# Patient Record
Sex: Male | Born: 1955 | Race: White | Hispanic: No | Marital: Married | Smoking: Current every day smoker
Health system: Southern US, Community
[De-identification: ages and names within clinical notes are randomized; demographics above are authoritative.]

## PROBLEM LIST (undated history)

## (undated) DIAGNOSIS — I2510 Atherosclerotic heart disease of native coronary artery without angina pectoris: Secondary | ICD-10-CM

## (undated) DIAGNOSIS — I219 Acute myocardial infarction, unspecified: Secondary | ICD-10-CM

## (undated) DIAGNOSIS — K219 Gastro-esophageal reflux disease without esophagitis: Secondary | ICD-10-CM

## (undated) DIAGNOSIS — I10 Essential (primary) hypertension: Secondary | ICD-10-CM

## (undated) DIAGNOSIS — N200 Calculus of kidney: Secondary | ICD-10-CM

## (undated) DIAGNOSIS — Z87442 Personal history of urinary calculi: Secondary | ICD-10-CM

## (undated) DIAGNOSIS — G839 Paralytic syndrome, unspecified: Secondary | ICD-10-CM

## (undated) DIAGNOSIS — J449 Chronic obstructive pulmonary disease, unspecified: Secondary | ICD-10-CM

## (undated) DIAGNOSIS — M1990 Unspecified osteoarthritis, unspecified site: Secondary | ICD-10-CM

## (undated) DIAGNOSIS — M51369 Other intervertebral disc degeneration, lumbar region without mention of lumbar back pain or lower extremity pain: Secondary | ICD-10-CM

## (undated) DIAGNOSIS — M5136 Other intervertebral disc degeneration, lumbar region: Secondary | ICD-10-CM

## (undated) DIAGNOSIS — M5126 Other intervertebral disc displacement, lumbar region: Secondary | ICD-10-CM

## (undated) DIAGNOSIS — J189 Pneumonia, unspecified organism: Secondary | ICD-10-CM

## (undated) HISTORY — PX: APPENDECTOMY: SHX54

## (undated) HISTORY — PX: HAND SURGERY: SHX662

## (undated) HISTORY — DX: Gastro-esophageal reflux disease without esophagitis: K21.9

## (undated) HISTORY — PX: BACK SURGERY: SHX140

## (undated) HISTORY — DX: Calculus of kidney: N20.0

## (undated) HISTORY — PX: CATARACT EXTRACTION: SUR2

## (undated) HISTORY — DX: Essential (primary) hypertension: I10

---

## 2004-10-10 HISTORY — PX: KNEE ARTHROSCOPY: SUR90

## 2005-01-25 ENCOUNTER — Ambulatory Visit (HOSPITAL_COMMUNITY): Admission: RE | Admit: 2005-01-25 | Discharge: 2005-01-25 | Payer: Self-pay | Admitting: Gastroenterology

## 2005-06-02 ENCOUNTER — Ambulatory Visit: Payer: Self-pay | Admitting: Orthopedic Surgery

## 2005-06-07 ENCOUNTER — Ambulatory Visit (HOSPITAL_COMMUNITY): Admission: RE | Admit: 2005-06-07 | Discharge: 2005-06-07 | Payer: Self-pay | Admitting: Orthopedic Surgery

## 2005-06-16 ENCOUNTER — Ambulatory Visit: Payer: Self-pay | Admitting: Orthopedic Surgery

## 2005-07-01 ENCOUNTER — Ambulatory Visit: Payer: Self-pay | Admitting: Orthopedic Surgery

## 2005-07-01 ENCOUNTER — Ambulatory Visit (HOSPITAL_COMMUNITY): Admission: RE | Admit: 2005-07-01 | Discharge: 2005-07-01 | Payer: Self-pay | Admitting: Orthopedic Surgery

## 2005-07-04 ENCOUNTER — Ambulatory Visit: Payer: Self-pay | Admitting: Orthopedic Surgery

## 2005-07-25 ENCOUNTER — Ambulatory Visit: Payer: Self-pay | Admitting: Orthopedic Surgery

## 2005-08-17 ENCOUNTER — Ambulatory Visit: Payer: Self-pay | Admitting: Orthopedic Surgery

## 2006-05-15 ENCOUNTER — Ambulatory Visit: Payer: Self-pay | Admitting: Orthopedic Surgery

## 2013-06-05 ENCOUNTER — Ambulatory Visit (INDEPENDENT_AMBULATORY_CARE_PROVIDER_SITE_OTHER): Payer: BC Managed Care – PPO

## 2013-06-05 ENCOUNTER — Encounter: Payer: Self-pay | Admitting: Orthopedic Surgery

## 2013-06-05 ENCOUNTER — Ambulatory Visit (INDEPENDENT_AMBULATORY_CARE_PROVIDER_SITE_OTHER): Payer: BC Managed Care – PPO | Admitting: Orthopedic Surgery

## 2013-06-05 VITALS — BP 129/69 | Ht 73.0 in | Wt 260.0 lb

## 2013-06-05 DIAGNOSIS — M25561 Pain in right knee: Secondary | ICD-10-CM | POA: Insufficient documentation

## 2013-06-05 DIAGNOSIS — M19079 Primary osteoarthritis, unspecified ankle and foot: Secondary | ICD-10-CM

## 2013-06-05 DIAGNOSIS — M25569 Pain in unspecified knee: Secondary | ICD-10-CM

## 2013-06-05 DIAGNOSIS — M179 Osteoarthritis of knee, unspecified: Secondary | ICD-10-CM | POA: Insufficient documentation

## 2013-06-05 DIAGNOSIS — M1710 Unilateral primary osteoarthritis, unspecified knee: Secondary | ICD-10-CM

## 2013-06-05 NOTE — Patient Instructions (Addendum)
You have received a steroid shot. 15% of patients experience increased pain at the injection site with in the next 24 hours. This is best treated with ice and tylenol extra strength 2 tabs every 8 hours. If you are still having pain please call the office.   Joint Injection Care After Refer to this sheet in the next few days. These instructions provide you with information on caring for yourself after you have had a joint injection. Your caregiver also may give you more specific instructions. Your treatment has been planned according to current medical practices, but problems sometimes occur. Call your caregiver if you have any problems or questions after your procedure. After any type of joint injection, it is not uncommon to experience:  Soreness, swelling, or bruising around the injection site.  Mild numbness, tingling, or weakness around the injection site caused by the numbing medicine used before or with the injection. It also is possible to experience the following effects associated with the specific agent after injection:  Iodine-based contrast agents:  Allergic reaction (itching, hives, widespread redness, and swelling beyond the injection site).  Corticosteroids (These effects are rare.):  Allergic reaction.  Increased blood sugar levels (If you have diabetes and you notice that your blood sugar levels have increased, notify your caregiver).  Increased blood pressure levels.  Mood swings.  Hyaluronic acid in the use of viscosupplementation.  Temporary heat or redness.  Temporary rash and itching.  Increased fluid accumulation in the injected joint. These effects all should resolve within a day after your procedure.  HOME CARE INSTRUCTIONS  Limit yourself to light activity the day of your procedure. Avoid lifting heavy objects, bending, stooping, or twisting.  Take prescription or over-the-counter pain medication as directed by your caregiver.  You may apply ice to  your injection site to reduce pain and swelling the day of your procedure. Ice may be applied 3-4 times:  Put ice in a plastic bag.  Place a towel between your skin and the bag.  Leave the ice on for no longer than 15-20 minutes each time. SEEK IMMEDIATE MEDICAL CARE IF:   Pain and swelling get worse rather than better or extend beyond the injection site.  Numbness does not go away.  Blood or fluid continues to leak from the injection site.  You have chest pain.  You have swelling of your face or tongue.  You have trouble breathing or you become dizzy.  You develop a fever, chills, or severe tenderness at the injection site that last longer than 1 day. MAKE SURE YOU:  Understand these instructions.  Watch your condition.  Get help right away if you are not doing well or if you get worse. Document Released: 06/09/2011 Document Revised: 12/19/2011 Document Reviewed: 06/09/2011 [facility] Patient Information 2014 [place], [place]. Wear and Tear Disorders of the Knee (Arthritis, Osteoarthritis) Everyone will experience wear and tear injuries (arthritis, osteoarthritis) of the knee. These are the changes we all get as we age. They come from the joint stress of daily living. The amount of cartilage damage in your knee and your symptoms determine if you need surgery. Mild problems require approximately two months recovery time. More severe problems take several months to recover. With mild problems, your surgeon may find worn and rough cartilage surfaces. With severe changes, your surgeon may find cartilage that has completely worn away and exposed the bone. Loose bodies of bone and cartilage, bone spurs (excess bone growth), and injuries to the menisci (cushions between the large  bones of your leg) are also common. All of these problems can cause pain. For a mild wear and tear problem, rough cartilage may simply need to be shaved and smoothed. For more severe problems with areas of exposed  bone, your surgeon may use an instrument for roughing up the bone surfaces to stimulate new cartilage growth. Loose bodies are usually removed. Torn menisci may be trimmed or repaired. ABOUT THE ARTHROSCOPIC PROCEDURE Arthroscopy is a surgical technique. It allows your orthopedic surgeon to diagnose and treat your knee injury with accuracy. The surgeon looks into your knee through a small scope. The scope is like a small (pencil-sized) telescope. Arthroscopy is less invasive than open knee surgery. You can expect a more rapid recovery. After the procedure, you will be moved to a recovery area until most of the effects of the medication have worn off. Your caregiver will discuss the test results with you. RECOVERY The severity of the arthritis and the type of procedure performed will determine recovery time. Other important factors include age, physical condition, medical conditions, and the type of rehabilitation program. Strengthening your muscles after arthroscopy helps guarantee a better recovery. Follow your caregiver's instructions. Use crutches, rest, elevate, ice, and do knee exercises as instructed. Your caregivers will help you and instruct you with exercises and other physical therapy required to regain your mobility, muscle strength, and functioning following surgery. Only take over-the-counter or prescription medicines for pain, discomfort, or fever as directed by your caregiver.  SEEK MEDICAL CARE IF:   There is increased bleeding (more than a small spot) from the wound.  You notice redness, swelling, or increasing pain in the wound.  Pus is coming from wound.  You develop an unexplained oral temperature above 102 F (38.9 C) , or as your caregiver suggests.  You notice a foul smell coming from the wound or dressing.  You have severe pain with motion of the knee. SEEK IMMEDIATE MEDICAL CARE IF:   You develop a rash.  You have difficulty breathing.  You have any allergic  problems. MAKE SURE YOU:   Understand these instructions.  Will watch your condition.  Will get help right away if you are not doing well or get worse. Document Released: 09/23/2000 Document Revised: 12/19/2011 Document Reviewed: 02/20/2008 [facility] Patient Information 2014 [place], [place].

## 2013-06-05 NOTE — Progress Notes (Signed)
Patient ID: Leon Mitchell, male   DOB: [birth year], 57 y.o.   MRN: [id number]  Chief Complaint  Patient presents with  . Knee Pain    Right knee pain, hurts when sitting and giving occasional giving away.    The patient comes in with a history of a left knee arthroscopy which ended several years ago he did well presents now with right knee pain  Pain came on gradually without trauma. Complains of sharp 7/10 intermittent symptoms which are worse when he sitting and appear to be over the front of the knee. Lung bases driving has to straighten his knee out. He reports catching swelling and tingling but no radiation of pain  Review of systems positive for flank pain when he had his kidney stone and joint pain his other systems were reviewed and are normal  The past, family history and social history have been reviewed and are recorded in the corresponding sections of epic   BP 129/69  Ht 6\' 1"  (1.854 m)  Wt 260 lb (117.935 kg)  BMI 34.31 kg/m2 General appearance is normal he is oriented x3 his mood and affect is normal he has normal gait and station. 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  He did also mention that he has bilateral foot pain and I recommended some orthotics most likely has some degenerative arthrosis there as well  If the injection we given today does not work then we will consider arthroscopic surgery  Knee  Injection Procedure Note  Pre-operative Diagnosis: right knee oa  Post-operative Diagnosis:  same  Indications: pain  Anesthesia: ethyl chloride   Procedure Details   Verbal consent was obtained for the procedure. Time out was completed.The joint was prepped with alcohol, followed by  Ethyl chloride spray and A 20 gauge needle was inserted into the knee via lateral approach; 4ml 1% lidocaine and 1 ml of depomedrol  was then injected into the joint . The needle was removed and the area cleansed and dressed.  Complications:  None; patient tolerated the procedure well.

## 2013-07-22 ENCOUNTER — Encounter: Payer: Self-pay | Admitting: Orthopedic Surgery

## 2013-07-25 ENCOUNTER — Telehealth: Payer: Self-pay | Admitting: Orthopedic Surgery

## 2013-07-25 NOTE — Telephone Encounter (Signed)
Patient had been called with voice message left on answer machine, and a follow up letter had been sent, regarding appointment for 08/06/13, which needs to be re-schedule due to change in provider schedule.  Patient returned call; elects to hold on appointment; states feeling better.

## 2013-08-06 ENCOUNTER — Ambulatory Visit: Payer: BC Managed Care – PPO | Admitting: Orthopedic Surgery

## 2013-09-17 ENCOUNTER — Encounter (INDEPENDENT_AMBULATORY_CARE_PROVIDER_SITE_OTHER): Payer: Self-pay

## 2013-09-17 ENCOUNTER — Encounter (INDEPENDENT_AMBULATORY_CARE_PROVIDER_SITE_OTHER): Payer: Self-pay | Admitting: *Deleted

## 2013-09-19 ENCOUNTER — Ambulatory Visit (INDEPENDENT_AMBULATORY_CARE_PROVIDER_SITE_OTHER): Payer: BC Managed Care – PPO | Admitting: Orthopedic Surgery

## 2013-09-19 ENCOUNTER — Encounter: Payer: Self-pay | Admitting: Orthopedic Surgery

## 2013-09-19 VITALS — BP 144/82 | Ht 73.0 in | Wt 260.0 lb

## 2013-09-19 DIAGNOSIS — M1710 Unilateral primary osteoarthritis, unspecified knee: Secondary | ICD-10-CM

## 2013-09-19 NOTE — Patient Instructions (Signed)
GLUCOSAMINE/CHONDROITIN CARTILAGE [OSTEOBIFLEX]

## 2013-09-19 NOTE — Progress Notes (Signed)
Patient ID: Leon Mitchell, male   DOB: [birth year], 57 y.o.   MRN: [id number]  Chief Complaint  Patient presents with  . Follow-up    2 month recheck on right knee after injection.    [name] in for evaluation of his right knee for continued anterior right knee pain. Status post injection right knee. He says he primarily has anterior knee pain mainly controlled with over-the-counter medication  System review.denies mechanical symptoms  Examination BP 144/82  Ht 6\' 1"  (1.854 m)  Wt 260 lb (117.935 kg)  BMI 34.31 kg/m2 General appearance is normal, the patient is alert and oriented x3 with normal mood and affect. He still maintained his range of motion he has medial joint line tenderness and some crepitance in the patellofemoral joint his knee is stable there is no effusion Neurovascular exam is intact  Encounter Diagnosis  Name Primary?  . OA (osteoarthritis) of knee Yes    Like a cortisone injection in his right knee  Knee  Injection Procedure Note  Pre-operative Diagnosis: right knee oa  Post-operative Diagnosis: same  Indications: pain  Anesthesia: ethyl chloride   Procedure Details   Verbal consent was obtained for the procedure. Time out was completed.The joint was prepped with alcohol, followed by  Ethyl chloride spray and A 20 gauge needle was inserted into the knee via lateral approach; 4ml 1% lidocaine and 1 ml of depomedrol  was then injected into the joint . The needle was removed and the area cleansed and dressed.  Complications:  None; patient tolerated the procedure well.

## 2014-07-09 ENCOUNTER — Encounter: Payer: Self-pay | Admitting: Orthopedic Surgery

## 2015-11-05 ENCOUNTER — Other Ambulatory Visit (INDEPENDENT_AMBULATORY_CARE_PROVIDER_SITE_OTHER): Payer: Self-pay | Admitting: *Deleted

## 2015-11-05 DIAGNOSIS — Z86010 Personal history of colonic polyps: Secondary | ICD-10-CM

## 2015-11-20 ENCOUNTER — Other Ambulatory Visit (INDEPENDENT_AMBULATORY_CARE_PROVIDER_SITE_OTHER): Payer: Self-pay | Admitting: *Deleted

## 2015-11-20 ENCOUNTER — Encounter (INDEPENDENT_AMBULATORY_CARE_PROVIDER_SITE_OTHER): Payer: Self-pay | Admitting: *Deleted

## 2015-11-20 MED ORDER — PEG 3350-KCL-NA BICARB-NACL 420 G PO SOLR: 4000.0000 mL | Freq: Once | ORAL | Status: DC

## 2015-11-20 NOTE — Telephone Encounter (Signed)
Patient needs trilyte 

## 2015-11-25 ENCOUNTER — Ambulatory Visit (INDEPENDENT_AMBULATORY_CARE_PROVIDER_SITE_OTHER): Payer: BLUE CROSS/BLUE SHIELD | Admitting: Orthopedic Surgery

## 2015-11-25 ENCOUNTER — Ambulatory Visit (INDEPENDENT_AMBULATORY_CARE_PROVIDER_SITE_OTHER): Payer: BLUE CROSS/BLUE SHIELD

## 2015-11-25 ENCOUNTER — Encounter: Payer: Self-pay | Admitting: Orthopedic Surgery

## 2015-11-25 VITALS — BP 169/105 | Ht 73.0 in | Wt 251.0 lb

## 2015-11-25 DIAGNOSIS — M79671 Pain in right foot: Secondary | ICD-10-CM

## 2015-11-25 DIAGNOSIS — M19071 Primary osteoarthritis, right ankle and foot: Secondary | ICD-10-CM

## 2015-11-25 DIAGNOSIS — M129 Arthropathy, unspecified: Secondary | ICD-10-CM

## 2015-11-25 NOTE — Progress Notes (Signed)
Patient ID: Leon Mitchell, male   DOB: 04-14-1956, 60 y.o.   MRN: [id number]  Chief Complaint  Patient presents with  . Foot Pain    Rt foot pain    HPI Leon Mitchell is a 60 y.o. male.  Presents for evaluation of right foot pain. Basically he's had several year history of ongoing pain in his right foot quality dull throbbing and sharp, severity increasing and 7+ at 10 duration as stated several years timing constant worse with exercise not improved with 70 Dosepak  Review of Systems Review of Systems  Past Medical History  Diagnosis Date  . Hypertension     Past Surgical History  Procedure Laterality Date  . Knee arthroscopy Left   . Hand surgery    . Appendectomy      No family history on file.  Social History Social History  Substance Use Topics  . Smoking status: Current Every Day Smoker  . Smokeless tobacco: None  . Alcohol Use: Yes    No Known Allergies  Current Outpatient Prescriptions  Medication Sig Dispense Refill  . naproxen sodium (ANAPROX) 220 MG tablet Take 220 mg by mouth 2 (two) times daily with a meal.    . polyethylene glycol-electrolytes (NULYTELY/GOLYTELY) 420 g solution Take 4,000 mLs by mouth once. 4000 mL 0   No current facility-administered medications for this visit.       Physical Exam Physical Exam Blood pressure 169/105, height 6\' 1"  (1.854 m), weight 251 lb (113.853 kg). Appearance, there are no abnormalities in terms of appearance the patient was well-developed and well-nourished. The grooming and hygiene were normal.  Mental status orientation, there was normal alertness and orientation Mood pleasant Ambulatory status normal with no assistive devices  Examination of the right foot tenderness over the dorsal lateral foot near the cuboid and midtarsal joints with normal range of motion at the ankle normal stability tests for the ankle no atrophy in the foot Skin warm dry and intact without laceration or ulceration or  erythema Neurologic examination normal sensation Vascular examination normal pulses with warm extremity and normal capillary refill  The opposite extremity normal alignment motion no atrophy    Data Reviewed X-ray shows dorsal spurs midfoot plantar spurs mild Achilles spur  Assessment  Dorsal lateral arthritis of the foot   Plan  Failed oral prednisone also failed Aleve recommend injection  Follow-up as needed  Inject dorsal lateral right foot Verbal consent obtained Timeout to confirm site  Alcohol and ethyl chloride prep  Injection performed appropriate precautions given

## 2015-12-09 ENCOUNTER — Telehealth (INDEPENDENT_AMBULATORY_CARE_PROVIDER_SITE_OTHER): Payer: Self-pay | Admitting: *Deleted

## 2015-12-09 NOTE — Telephone Encounter (Signed)
agree

## 2015-12-09 NOTE — Telephone Encounter (Signed)
Referring MD/PCP: vyas   Procedure: tcs  Reason/Indication:  Hx polyps  Has patient had this procedure before?  Yes, 2009 -- sacnned  If so, when, by whom and where?    Is there a family history of colon cancer?  no  Who?  What age when diagnosed?    Is patient diabetic?   no      Does patient have prosthetic heart valve or mechanical valve?  o  Do you have a pacemaker?  no  Has patient ever had endocarditis? no  Has patient had joint replacement within last 12 months?  no  Does patient tend to be constipated or take laxatives? no  Does patient have a history of alcohol/drug use?  no  Is patient on Coumadin, Plavix and/or Aspirin? no  Medications: none  Allergies: nkda  Medication Adjustment:   Procedure date & time: 01/06/16 at 730

## 2015-12-18 ENCOUNTER — Encounter: Payer: Self-pay | Admitting: *Deleted

## 2015-12-18 ENCOUNTER — Ambulatory Visit (INDEPENDENT_AMBULATORY_CARE_PROVIDER_SITE_OTHER): Payer: BLUE CROSS/BLUE SHIELD | Admitting: Diagnostic Neuroimaging

## 2015-12-18 VITALS — BP 159/95 | HR 69 | Ht 72.0 in | Wt 255.4 lb

## 2015-12-18 DIAGNOSIS — M4714 Other spondylosis with myelopathy, thoracic region: Secondary | ICD-10-CM

## 2015-12-18 NOTE — Progress Notes (Signed)
[facility]  PATIENT: Leon Mitchell DOB: [birth year]  REFERRING CLINICIAN: D Vyas HISTORY FROM: patient (review of outside notes) REASON FOR VISIT: new consult    HISTORICAL  CHIEF COMPLAINT:  Chief Complaint  Patient presents with  . Ataxia, ? transverse myelitis    rm 6, New pt," 4-5 wks ago I felt sore all over, began stumbling, feel jolts from my back down, numb from chest down, feet ice cold"    HISTORY OF PRESENT ILLNESS:   60 year old right-handed male here for evaluation of numbness and weakness from chest down to feet.  3 months ago patient noticed some pain in his legs. He was having some intermittent muscle Cramping. Around 6 weeks ago, beginning A. Fib rate 2017, patient had significant and sudden onset of weakness and heaviness in his hips, legs, with numbness from his middle chest all the way down to his toes. Patient having more problems with balance and walking. Patient was evaluated by PCP who ordered MRI of the lumbar spine which showed multilevel degenerative changes. Patient was also suspected to have thoracic myelopathy and referred to me for further evaluation.  Patient has had some gradual onset of frequent urination and mild impotence over past several years, but not significant worsened since past 6 weeks. He has no significant weakness in his hands or arms. No significant neck pain. No significant change in vision, speech or headaches. Patient having some intermittent electrical jolts and spasms when he moves his head or spine.    REVIEW OF SYSTEMS: Full 14 system review of systems performed and negative with exception of: Fatigue joint pain numbness weakness decreased energy joint pain cramps aching muscles urination problems impotence.   ALLERGIES: No Known Allergies  HOME MEDICATIONS: Outpatient Prescriptions Prior to Visit  Medication Sig Dispense Refill  . naproxen sodium (ANAPROX) 220 MG tablet Take 220 mg by mouth 2 (two)  times daily with a meal.    . polyethylene glycol-electrolytes (NULYTELY/GOLYTELY) 420 g solution Take 4,000 mLs by mouth once. (Patient not taking: Reported on 12/18/2015) 4000 mL 0   No facility-administered medications prior to visit.    PAST MEDICAL HISTORY: Past Medical History  Diagnosis Date  . Hypertension   . Kidney stones   . GERD (gastroesophageal reflux disease)     PAST SURGICAL HISTORY: Past Surgical History  Procedure Laterality Date  . Knee arthroscopy Left 2006  . Hand surgery    . Appendectomy      FAMILY HISTORY: Family History  Problem Relation Age of Onset  . Hypertension Mother   . Hypertension Father   . Hypertension Sister   . Hypertension Brother     SOCIAL HISTORY:  Social History   Social History  . Marital Status: Widowed    Spouse Name: N/A  . Number of Children: 2  . Years of Education: 13   Occupational History  .      Crop Production   Social History Main Topics  . Smoking status: Current Every Day Smoker -- 1.00 packs/day  . Smokeless tobacco: Not on file  . Alcohol Use: Yes     Comment: very little  . Drug Use: No  . Sexual Activity: Not on file   Other Topics Concern  . Not on file   Social History Narrative   Mother lives with patient   Caffeine use- 3-4 glasses tea     PHYSICAL EXAM  GENERAL EXAM/CONSTITUTIONAL: Vitals:  Filed Vitals:   12/18/15 0950  BP: 159/95  Pulse: 69  Height: 6' (1.829 m)  Weight: 255 lb 6.4 oz (115.849 kg)     Body mass index is 34.63 kg/(m^2).  Visual Acuity Screening   Right eye Left eye Both eyes  Without correction: 20/20 20/30   With correction:        Patient is in no distress; well developed, nourished and groomed; neck is supple  CARDIOVASCULAR:  Examination of carotid arteries is normal; no carotid bruits  Regular rate and rhythm, no murmurs  Examination of peripheral vascular system by observation and palpation is normal  EYES:  Ophthalmoscopic exam of  optic discs and posterior segments is normal; no papilledema or hemorrhages  MUSCULOSKELETAL:  Gait, strength, tone, movements noted in Neurologic exam below  NEUROLOGIC: MENTAL STATUS:  No flowsheet data found.  awake, alert, oriented to person, place and time  recent and remote memory intact  normal attention and concentration  language fluent, comprehension intact, naming intact  fund of knowledge appropriate  CRANIAL NERVE:   2nd - no papilledema on fundoscopic exam  2nd, 3rd, 4th, 6th - pupils equal and reactive to light, visual fields full to confrontation, extraocular muscles intact, no nystagmus  5th - facial sensation symmetric  7th - facial strength symmetric  8th - hearing intact  9th - palate elevates symmetrically, uvula midline  11th - shoulder shrug symmetric  12th - tongue protrusion midline  MOTOR:   normal bulk and tone, full strength in the BUE, BLE; EXCEPT BLE 4+; SLIGHT INCR TONE IN BLE  SENSORY:   normal and symmetric to light touch; DECR PP FROM T5 DOWN TO FEET; VIB < 5 SEC AT TOES  COORDINATION:   finger-nose-finger, fine finger movements normal  REFLEXES:   deep tendon reflexes BRISK (3+) and symmetric; MUTE TOES  GAIT/STATION:   SLOW CAUTIOUS GAIT; SLIGHTLY UNSTEADY    DIAGNOSTIC DATA (LABS, IMAGING, TESTING) - I reviewed patient records, labs, notes, testing and imaging myself where available.  No results found for: WBC, HGB, HCT, MCV, PLT No results found for: NA, K, CL, CO2, GLUCOSE, BUN, CREATININE, CALCIUM, PROT, ALBUMIN, AST, ALT, ALKPHOS, BILITOT, GFRNONAA, GFRAA No results found for: CHOL, HDL, LDLCALC, LDLDIRECT, TRIG, CHOLHDL No results found for: HGBA1C No results found for: VITAMINB12 No results found for: TSH   12/08/15 MRI lumbar spine [I reviewed images myself and agree with interpretation. 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 Summary of pertinent findings includes: - T10-11 mild to moderate bilateral foraminal narrowing. - T11-12 bulge with ventral thecal sac narrowing and mild posterior cord displacement. Kyphosis centered at this level. Axial images not obtained. - L1-2 multifactorial moderate spinal stenosis and minimal bilateral foraminal narrowing. - L2-3 multifactorial moderate spinal stenosis and minimal bilateral foraminal narrowing. - L3-4 multifactorial moderate to marked spinal stenosis and mild to moderate bilateral foraminal narrowing. - L4-5 multifactorial moderate spinal stenosis and mild to moderate bilateral foraminal narrowing. - L5-S1 facet degenerative changes. - Atherosclerotic changes of the abdominal aorta with dilation suspected but incompletely assessed on present exam. Ectatic common iliac arteries measuring up to 1.8 cm on the left. CT angiogram or ultrasound can be obtained for further delineation. - Right renal cysts incompletely assessed on present exam.    ASSESSMENT AND PLAN  60 y.o. year old male here with progressive lower extremity pain and cramps, with more severe worsening of weakness and numbness from chest down to feet 6 weeks ago. Exam notable for mild  weakness of lower extremities, brisk reflexes throughout, slightly increased tone in lower extremities, decreased sensation from T5 down to the toes. 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 Etiologies could include extrinsic compression versus intrinsic autoimmune or inflammatory etiologies. We'll check MRI of the thoracic spine for further evaluation.   Dx:  1. Thoracic myelopathy      PLAN: - check MRI thoracic spine - check labs  Orders Placed This Encounter  Procedures  . MR Thoracic Spine W Wo Contrast  . Vitamin B12  . VITAMIN D 25 Hydroxy (Vit-D Deficiency, Fractures)  . ANA w/Reflex if Positive   Return in about 1 month (around 01/18/2016).  I reviewed images, labs,  notes, records myself. I summarized findings and reviewed with patient, for this high risk condition (possible thoracic myelopathy) requiring high complexity decision making.     Penni Bombard, MD [id number], [id number] AM Certified in Neurology, Neurophysiology and Neuroimaging  [facility] [address] [place], [place] [address] [phone]

## 2015-12-18 NOTE — Patient Instructions (Addendum)
Thank you for coming to see [place] at [facility]. I hope we have been able to provide you high quality care today.  You may receive a patient satisfaction survey over the next few weeks. We would appreciate your feedback and comments so that we may continue to improve ourselves and the health of our patients.  - I will check MRI thoracic spine and labs - be cautious with balance and walking and driving   ~~~~~~~~~~~~~~~~~~~~~~~~~~~~~~~~~~~~~~~~~~~~~~~~~~~~~~~~~~~~~~~~~  DR. Zaion Hreha'S GUIDE TO HAPPY AND HEALTHY LIVING These are some of my general health and wellness recommendations. Some of them may apply to you better than others. Please use common sense as you try these suggestions and feel free to ask me any questions.   ACTIVITY/FITNESS Mental, social, emotional and physical stimulation are very important for brain and body health. Try learning a new activity (arts, music, language, sports, games).  Keep moving your body to the best of your abilities. You can do this at home, inside or outside, the park, community center, gym or anywhere you like. Consider a physical therapist or personal trainer to get started. Consider the app Sworkit. Fitness trackers such as smart-watches, smart-phones or Fitbits can help as well.   NUTRITION Eat more plants: colorful vegetables, nuts, seeds and berries.  Eat less sugar, salt, preservatives and processed foods.  Avoid toxins such as cigarettes and alcohol.  Drink water when you are thirsty. Warm water with a slice of lemon is an excellent morning drink to start the day.  Consider these websites for more information The Nutrition Source (https://www.henry-hernandez.biz/) Precision Nutrition (WindowBlog.ch)   RELAXATION Consider practicing mindfulness meditation or other relaxation techniques such as deep breathing, prayer, yoga, tai chi, massage. See website mindful.org or the apps  Headspace or Calm to help get started.   SLEEP Try to get at least 7-8+ hours sleep per day. Regular exercise and reduced caffeine will help you sleep better. Practice good sleep hygeine techniques. See website sleep.org for more information.   PLANNING Prepare estate planning, living will, healthcare POA documents. Sometimes this is best planned with the help of an attorney. Theconversationproject.org and agingwithdignity.org are excellent resources.

## 2015-12-19 LAB — ANA W/REFLEX IF POSITIVE
Anti JO-1: <0.2 AI (ref 0.0–0.9)
Anti Nuclear Antibody(ANA): POSITIVE — AB
Centromere Ab Screen: <0.2 AI (ref 0.0–0.9)
Chromatin Ab SerPl-aCnc: <0.2 AI (ref 0.0–0.9)
DSDNA AB: 2 [IU]/mL (ref 0–9)
ENA RNP Ab: 1 AI — ABNORMAL HIGH (ref 0.0–0.9)
ENA SM AB SER-ACNC: 0.2 AI (ref 0.0–0.9)
ENA SSA (RO) Ab: <0.2 AI (ref 0.0–0.9)
ENA SSB (LA) Ab: <0.2 AI (ref 0.0–0.9)

## 2015-12-19 LAB — VITAMIN D 25 HYDROXY (VIT D DEFICIENCY, FRACTURES): Vit D, 25-Hydroxy: 19.9 ng/mL — ABNORMAL LOW (ref 30.0–100.0)

## 2015-12-19 LAB — VITAMIN B12: Vitamin B-12: 199 pg/mL — ABNORMAL LOW (ref 211–946)

## 2015-12-21 ENCOUNTER — Telehealth: Payer: Self-pay | Admitting: *Deleted

## 2015-12-21 NOTE — Telephone Encounter (Signed)
Spoke with patient and informed him that his Vit B12 and Vit D are low. Dr Leta Baptist advises he begin a multivitamin daily. Informed him the ANA test is elevated, but Dr Leta Baptist will wait for MRI results to correlate. He verbalized understanding, agreement.

## 2015-12-30 ENCOUNTER — Ambulatory Visit (INDEPENDENT_AMBULATORY_CARE_PROVIDER_SITE_OTHER): Payer: BLUE CROSS/BLUE SHIELD

## 2015-12-30 DIAGNOSIS — M4714 Other spondylosis with myelopathy, thoracic region: Secondary | ICD-10-CM | POA: Diagnosis not present

## 2015-12-30 MED ORDER — GADOPENTETATE DIMEGLUMINE 469.01 MG/ML IV SOLN: 20.0000 mL | Freq: Once | INTRAVENOUS | Status: DC | PRN

## 2016-01-01 ENCOUNTER — Telehealth: Payer: Self-pay | Admitting: Diagnostic Neuroimaging

## 2016-01-01 DIAGNOSIS — M4714 Other spondylosis with myelopathy, thoracic region: Secondary | ICD-10-CM

## 2016-01-01 MED ORDER — PREDNISONE 10 MG PO TABS: ORAL_TABLET | ORAL | Status: DC

## 2016-01-01 NOTE — Telephone Encounter (Signed)
I called patient and gave MRI results. T5 myelopathy confirmed. Will refer urgently to Dr. Maia Plan Neurosurgery for evaluation. Will send in prednisone pack to start now. Advised patient to avoid any heavy lifting. Patient to go to ER if any sudden worsening over the weekend or before NSGY eval. Patient agrees with plan.  Regarding lab testing, he should start multi-vitamin. Re: ANA, likely an incidental finding given MRI results, but may need rheumatology follow up after NSGY eval.     Penni Bombard, MD [id number], [id number] PM Certified in Neurology, Neurophysiology and Neuroimaging  [facility] [address] [place], [place] [address] [phone]

## 2016-01-04 NOTE — Telephone Encounter (Signed)
Spoke with Louretta Shorten at Dr Elta Guadeloupe Roy's office and advised her of urgent referral. faxed all required documents. She stated she would make Dr Carloyn Manner aware.

## 2016-01-05 ENCOUNTER — Telehealth: Payer: Self-pay | Admitting: Diagnostic Neuroimaging

## 2016-01-05 NOTE — Telephone Encounter (Signed)
Waiting on Neuro Surgery to call him for Dr. Carloyn Manner office. Patient would  Like a call back explaining  MRI results again he is not in full understanding. [phone].

## 2016-01-05 NOTE — Telephone Encounter (Signed)
i called patient and explained mri results again. Pt understands plan. -VRP

## 2016-01-06 ENCOUNTER — Encounter (HOSPITAL_COMMUNITY): Payer: Self-pay | Admitting: *Deleted

## 2016-01-06 ENCOUNTER — Ambulatory Visit (HOSPITAL_COMMUNITY)
Admission: RE | Admit: 2016-01-06 | Discharge: 2016-01-06 | Disposition: A | Discharge status: Home / Self Care | Payer: BLUE CROSS/BLUE SHIELD | Source: Ambulatory Visit | Attending: Internal Medicine | Admitting: Internal Medicine

## 2016-01-06 ENCOUNTER — Encounter (HOSPITAL_COMMUNITY)
Admission: RE | Disposition: A | Discharge status: Home / Self Care | Payer: Self-pay | Source: Ambulatory Visit | Attending: Internal Medicine

## 2016-01-06 DIAGNOSIS — K5730 Diverticulosis of large intestine without perforation or abscess without bleeding: Secondary | ICD-10-CM

## 2016-01-06 DIAGNOSIS — Z86010 Personal history of colonic polyps: Secondary | ICD-10-CM

## 2016-01-06 DIAGNOSIS — K219 Gastro-esophageal reflux disease without esophagitis: Secondary | ICD-10-CM | POA: Insufficient documentation

## 2016-01-06 DIAGNOSIS — I10 Essential (primary) hypertension: Secondary | ICD-10-CM | POA: Diagnosis not present

## 2016-01-06 DIAGNOSIS — Z09 Encounter for follow-up examination after completed treatment for conditions other than malignant neoplasm: Secondary | ICD-10-CM

## 2016-01-06 DIAGNOSIS — Z79899 Other long term (current) drug therapy: Secondary | ICD-10-CM | POA: Insufficient documentation

## 2016-01-06 DIAGNOSIS — Z1211 Encounter for screening for malignant neoplasm of colon: Secondary | ICD-10-CM | POA: Diagnosis not present

## 2016-01-06 DIAGNOSIS — D125 Benign neoplasm of sigmoid colon: Secondary | ICD-10-CM | POA: Diagnosis not present

## 2016-01-06 DIAGNOSIS — K644 Residual hemorrhoidal skin tags: Secondary | ICD-10-CM | POA: Diagnosis not present

## 2016-01-06 DIAGNOSIS — D120 Benign neoplasm of cecum: Secondary | ICD-10-CM

## 2016-01-06 DIAGNOSIS — D123 Benign neoplasm of transverse colon: Secondary | ICD-10-CM

## 2016-01-06 DIAGNOSIS — Z791 Long term (current) use of non-steroidal anti-inflammatories (NSAID): Secondary | ICD-10-CM | POA: Diagnosis not present

## 2016-01-06 HISTORY — DX: Other intervertebral disc displacement, lumbar region: M51.26

## 2016-01-06 HISTORY — DX: Other intervertebral disc degeneration, lumbar region without mention of lumbar back pain or lower extremity pain: M51.369

## 2016-01-06 HISTORY — PX: COLONOSCOPY: SHX5424

## 2016-01-06 HISTORY — DX: Other intervertebral disc degeneration, lumbar region: M51.36

## 2016-01-06 SURGERY — COLONOSCOPY
Anesthesia: Moderate Sedation

## 2016-01-06 MED ORDER — STERILE WATER FOR IRRIGATION IR SOLN
Status: DC | PRN
  Administered 2016-01-06: 08:00:00

## 2016-01-06 MED ORDER — MIDAZOLAM HCL 5 MG/5ML IJ SOLN
INTRAMUSCULAR | Status: DC | PRN
  Administered 2016-01-06 (×3): 2 mg via INTRAVENOUS
  Administered 2016-01-06: 1 mg via INTRAVENOUS

## 2016-01-06 MED ORDER — MIDAZOLAM HCL 5 MG/5ML IJ SOLN
INTRAMUSCULAR | Status: AC
  Filled 2016-01-06: qty 10

## 2016-01-06 MED ORDER — SODIUM CHLORIDE 0.9 % IV SOLN: INTRAVENOUS | Status: DC

## 2016-01-06 MED ORDER — MEPERIDINE HCL 50 MG/ML IJ SOLN
INTRAMUSCULAR | Status: AC
  Filled 2016-01-06: qty 1

## 2016-01-06 MED ORDER — MEPERIDINE HCL 50 MG/ML IJ SOLN
INTRAMUSCULAR | Status: DC | PRN
  Administered 2016-01-06 (×2): 25 mg via INTRAVENOUS

## 2016-01-06 NOTE — Discharge Instructions (Signed)
Resume usual medications and high fiber diet. Keep Naprosyn use to minimum. No driving for 24 hours. Physician will call with biopsy results.  Colonoscopy, Care After These instructions give you information on caring for yourself after your procedure. Your doctor may also give you more specific instructions. Call your doctor if you have any problems or questions after your procedure. HOME CARE  Do not drive for 24 hours.  Do not sign important papers or use machinery for 24 hours.  You may shower.  You may go back to your usual activities, but go slower for the first 24 hours.  Take rest breaks often during the first 24 hours.  Walk around or use warm packs on your belly (abdomen) if you have belly cramping or gas.  Drink enough fluids to keep your pee (urine) clear or pale yellow.  Resume your normal diet. Avoid heavy or fried foods.  Avoid drinking alcohol for 24 hours or as told by your doctor.  Only take medicines as told by your doctor. If a tissue sample (biopsy) was taken during the procedure:   Do not take aspirin or blood thinners for 7 days, or as told by your doctor.  Do not drink alcohol for 7 days, or as told by your doctor.  Eat soft foods for the first 24 hours. GET HELP IF: You still have a small amount of blood in your poop (stool) 2-3 days after the procedure. GET HELP RIGHT AWAY IF:  You have more than a small amount of blood in your poop.  You see clumps of tissue (blood clots) in your poop.  Your belly is puffy (swollen).  You feel sick to your stomach (nauseous) or throw up (vomit).  You have a fever.  You have belly pain that gets worse and medicine does not help. MAKE SURE YOU:  Understand these instructions.  Will watch your condition.  Will get help right away if you are not doing well or get worse.   This information is not intended to replace advice given to you by your health care provider. Make sure you discuss any questions you  have with your health care provider.   Document Released: 10/29/2010 Document Revised: 10/01/2013 Document Reviewed: 06/03/2013 Elsevier Interactive Patient Education 2016 Elsevier Inc.  Moderate Conscious Sedation, Adult, Care After Refer to this sheet in the next few weeks. These instructions provide you with information on caring for yourself after your procedure. Your health care provider may also give you more specific instructions. Your treatment has been planned according to current medical practices, but problems sometimes occur. Call your health care provider if you have any problems or questions after your procedure. WHAT TO EXPECT AFTER THE PROCEDURE  After your procedure:  You may feel sleepy, clumsy, and have poor balance for several hours.  Vomiting may occur if you eat too soon after the procedure. HOME CARE INSTRUCTIONS  Do not participate in any activities where you could become injured for at least 24 hours. Do not:  Drive.  Swim.  Ride a bicycle.  Operate heavy machinery.  Cook.  Use power tools.  Climb ladders.  Work from a high place.  Do not make important decisions or sign legal documents until you are improved.  If you vomit, drink water, juice, or soup when you can drink without vomiting. Make sure you have little or no nausea before eating solid foods.  Only take over-the-counter or prescription medicines for pain, discomfort, or fever as directed by your health  care provider.  Make sure you and your family fully understand everything about the medicines given to you, including what side effects may occur.  You should not drink alcohol, take sleeping pills, or take medicines that cause drowsiness for at least 24 hours.  If you smoke, do not smoke without supervision.  If you are feeling better, you may resume normal activities 24 hours after you were sedated.  Keep all appointments with your health care provider. SEEK MEDICAL CARE IF:  Your  skin is pale or bluish in color.  You continue to feel nauseous or vomit.  Your pain is getting worse and is not helped by medicine.  You have bleeding or swelling.  You are still sleepy or feeling clumsy after 24 hours. SEEK IMMEDIATE MEDICAL CARE IF:  You develop a rash.  You have difficulty breathing.  You develop any type of allergic problem.  You have a fever. MAKE SURE YOU:  Understand these instructions.  Will watch your condition.  Will get help right away if you are not doing well or get worse.   This information is not intended to replace advice given to you by your health care provider. Make sure you discuss any questions you have with your health care provider.   Document Released: 07/17/2013 Document Revised: 10/17/2014 Document Reviewed: 07/17/2013 Elsevier Interactive Patient Education [employer].

## 2016-01-06 NOTE — Telephone Encounter (Signed)
Pt called said he has not heard from Dr Rex Kras office and Hinton Dyer C requested he let her know. Pt can be reached at [phone]

## 2016-01-06 NOTE — Op Note (Signed)
[facility] Patient Name: Leon Mitchell Procedure Date: 01/06/2016 7:30 AM MRN: [id number] Date of Birth: [birth year] Attending MD: Hildred Laser , MD CSN: [id number] Age: 60 Admit Type: Outpatient Procedure:                Colonoscopy Indications:              High risk colon cancer surveillance: Personal                            history of colonic polyps Providers:                Hildred Laser, MD, Gwenlyn Fudge, RN, Randa Spike, Technician Referring MD:             Glenda Chroman (Referring MD) Medicines:                Meperidine 50 mg IV, Midazolam 7 mg IV Complications:            No immediate complications. Estimated Blood Loss:     Estimated blood loss was minimal. Procedure:                Pre-Anesthesia Assessment:                           - Prior to the procedure, a History and Physical                            was performed, and patient medications and                            allergies were reviewed. The patient's tolerance of                            previous anesthesia was also reviewed. The risks                            and benefits of the procedure and the sedation                            options and risks were discussed with the patient.                            All questions were answered, and informed consent                            was obtained. Prior Anticoagulants: The patient                            last took naproxen 14 days prior to the procedure.                            ASA Grade Assessment: I - A normal, healthy  patient. After reviewing the risks and benefits,                            the patient was deemed in satisfactory condition to                            undergo the procedure.                           After obtaining informed consent, the colonoscope                            was passed under direct vision. Throughout the                            procedure, the  patient's blood pressure, pulse, and                            oxygen saturations were monitored continuously. The                            [id number]) scope was introduced through                            the anus and advanced to the the cecum, identified                            by appendiceal orifice and ileocecal valve. The                            colonoscopy was performed without difficulty. The                            patient tolerated the procedure well. The quality                            of the bowel preparation was adequate. The                            ileocecal valve, appendiceal orifice, and rectum                            were photographed. Scope In: 7:44:14 AM Scope Out: 8:08:19 AM Scope Withdrawal Time: 0 hours 16 minutes 40 seconds  Total Procedure Duration: 0 hours 24 minutes 5 seconds  Findings:      A single non-bleeding erosion was found in the cecum. No stigmata of       recent bleeding were seen.      Three sessile polyps were found in the sigmoid colon, splenic flexure       and cecum. The polyps were small in size. These were biopsied with a       cold forceps for histology. Estimated blood loss was minimal.      Multiple medium-mouthed diverticula were found in the sigmoid colon.      Non-bleeding external hemorrhoids  were found during retroflexion. The       hemorrhoids were small. Impression:               - A single erosion in the cecum(secondary to NSAID).                           - Three small polyps in the sigmoid colon, at the                            splenic flexure and in the cecum. Biopsied.                           - Diverticulosis in the sigmoid colon.                           - Non-bleeding external hemorrhoids. Moderate Sedation:      Moderate (conscious) sedation was administered by the endoscopy nurse       and supervised by the endoscopist. The following parameters were       monitored: oxygen saturation,  heart rate, blood pressure, CO2       capnography and response to care. Total physician intraservice time was       31 minutes. Recommendation:           - Patient has a contact number available for                            emergencies. The signs and symptoms of potential                            delayed complications were discussed with the                            patient. Return to normal activities tomorrow.                            Written discharge instructions were provided to the                            patient.                           - High fiber diet today.                           - Continue present medications.                           - Await pathology results.                           - Repeat colonoscopy date to be determined after                            pending pathology results are reviewed for  surveillance. Procedure Code(s):        --- Professional ---                           [phone], Colonoscopy, flexible; with biopsy, single                            or multiple                           99152, Moderate sedation services provided by the                            same physician or other qualified health care                            professional performing the diagnostic or                            therapeutic service that the sedation supports,                            requiring the presence of an independent trained                            observer to assist in the monitoring of the                            patient's level of consciousness and physiological                            status; initial 15 minutes of intraservice time,                            patient age 31 years or older                           [phone], Moderate sedation services; each additional                            15 minutes intraservice time Diagnosis Code(s):        --- Professional ---                           D12.5, Benign  neoplasm of sigmoid colon                           D12.3, Benign neoplasm of transverse colon (hepatic                            flexure or splenic flexure)                           D12.0, Benign neoplasm of cecum  K64.4, Residual hemorrhoidal skin tags                           K57.30, Diverticulosis of large intestine without                            perforation or abscess without bleeding CPT copyright 2016 American Medical Association. All rights reserved. The codes documented in this report are preliminary and upon coder review may  be revised to meet current compliance requirements. Hildred Laser, MD Hildred Laser, MD 01/06/2016 8:22:10 AM This report has been signed electronically. Number of Addenda: 0

## 2016-01-06 NOTE — H&P (Signed)
Leon Mitchell is an 60 y.o. male.   Chief Complaint: Patient is here for colonoscopy. HPI: Patient is 60 year old Caucasian male who has history of colonic adenomas and is here for surveillance colonoscopy. He denies abdominal pain change in bowel habits or rectal bleeding. Patient had adenomas on his first colonoscopy in [place] back in 2006. Last exam in 2009 revealed 10 mm hyperplastic polyp. Family history is negative for CRC.  Past Medical History  Diagnosis Date  . Hypertension   . Kidney stones   . GERD (gastroesophageal reflux disease)   . Bulging lumbar disc     Past Surgical History  Procedure Laterality Date  . Knee arthroscopy Left 2006  . Hand surgery    . Appendectomy      Family History  Problem Relation Age of Onset  . Hypertension Mother   . Hypertension Father   . Hypertension Sister   . Hypertension Brother    Social History:  reports that he has been smoking.  He does not have any smokeless tobacco history on file. He reports that he drinks alcohol. He reports that he does not use illicit drugs.  Allergies: No Known Allergies  Medications Prior to Admission  Medication Sig Dispense Refill  . lisinopril (PRINIVIL,ZESTRIL) 10 MG tablet Take 10 mg by mouth daily.  3  . naproxen sodium (ANAPROX) 220 MG tablet Take 220 mg by mouth 2 (two) times daily with a meal.    . polyethylene glycol-electrolytes (NULYTELY/GOLYTELY) 420 g solution Take 4,000 mLs by mouth once. 4000 mL 0  . predniSONE (DELTASONE) 10 MG tablet Take 60mg  on day 1. Reduce by 10mg  each subsequent day. (60, 50, 40, 30, 20, 10, stop) 21 tablet 0    No results found for this or any previous visit (from the past 48 hour(s)). No results found.  ROS  Blood pressure 130/98, pulse 61, temperature 97.9 F (36.6 C), temperature source Oral, resp. rate 10, SpO2 99 %. Physical Exam  Constitutional: He appears well-developed and well-nourished.  HENT:  Mouth/Throat: Oropharynx is clear and moist.   Eyes: Conjunctivae are normal. No scleral icterus.  Neck: No thyromegaly present.  Cardiovascular: Normal rate, regular rhythm and normal heart sounds.   No murmur heard. Respiratory: Effort normal and breath sounds normal.  GI: Soft. He exhibits no distension and no mass. There is no tenderness.  Musculoskeletal: He exhibits no edema.  Lymphadenopathy:    He has no cervical adenopathy.  Neurological: He is alert.  Skin: Skin is warm and dry.     Assessment/Plan History of colonic adenomas. Surveillance colonoscopy.  Rogene Houston, MD 01/06/2016, 7:33 AM

## 2016-01-07 NOTE — Telephone Encounter (Signed)
Called and Dr. Rex Kras office and spoke to [facility] and she relayed Dr. Carloyn Manner is still reviewing patient's file. Called patient with details and he understood.

## 2016-01-08 ENCOUNTER — Encounter (HOSPITAL_COMMUNITY): Payer: Self-pay | Admitting: Internal Medicine

## 2016-01-12 HISTORY — PX: BACK SURGERY: SHX140

## 2016-01-18 ENCOUNTER — Ambulatory Visit (INDEPENDENT_AMBULATORY_CARE_PROVIDER_SITE_OTHER): Payer: BLUE CROSS/BLUE SHIELD | Admitting: Diagnostic Neuroimaging

## 2016-01-18 ENCOUNTER — Encounter: Payer: Self-pay | Admitting: Diagnostic Neuroimaging

## 2016-01-18 VITALS — BP 135/79 | HR 97 | Ht 72.0 in | Wt 248.0 lb

## 2016-01-18 DIAGNOSIS — E538 Deficiency of other specified B group vitamins: Secondary | ICD-10-CM | POA: Diagnosis not present

## 2016-01-18 DIAGNOSIS — E559 Vitamin D deficiency, unspecified: Secondary | ICD-10-CM | POA: Insufficient documentation

## 2016-01-18 DIAGNOSIS — M4714 Other spondylosis with myelopathy, thoracic region: Secondary | ICD-10-CM

## 2016-01-18 NOTE — Patient Instructions (Signed)
Thank you for coming to see [place] at [facility]. I hope we have been able to provide you high quality care today.  You may receive a patient satisfaction survey over the next few weeks. We would appreciate your feedback and comments so that we may continue to improve ourselves and the health of our patients.  - follow up with Dr. Carloyn Manner and Dr. Woody Seller   ~~~~~~~~~~~~~~~~~~~~~~~~~~~~~~~~~~~~~~~~~~~~~~~~~~~~~~~~~~~~~~~~~  DR. Esta Carmon'S GUIDE TO HAPPY AND HEALTHY LIVING These are some of my general health and wellness recommendations. Some of them may apply to you better than others. Please use common sense as you try these suggestions and feel free to ask me any questions.   ACTIVITY/FITNESS Mental, social, emotional and physical stimulation are very important for brain and body health. Try learning a new activity (arts, music, language, sports, games).  Keep moving your body to the best of your abilities. You can do this at home, inside or outside, the park, community center, gym or anywhere you like. Consider a physical therapist or personal trainer to get started. Consider the app Sworkit. Fitness trackers such as smart-watches, smart-phones or Fitbits can help as well.   NUTRITION Eat more plants: colorful vegetables, nuts, seeds and berries.  Eat less sugar, salt, preservatives and processed foods.  Avoid toxins such as cigarettes and alcohol.  Drink water when you are thirsty. Warm water with a slice of lemon is an excellent morning drink to start the day.  Consider these websites for more information The Nutrition Source (https://www.henry-hernandez.biz/) Precision Nutrition (WindowBlog.ch)   RELAXATION Consider practicing mindfulness meditation or other relaxation techniques such as deep breathing, prayer, yoga, tai chi, massage. See website mindful.org or the apps Headspace or Calm to help get started.   SLEEP Try to  get at least 7-8+ hours sleep per day. Regular exercise and reduced caffeine will help you sleep better. Practice good sleep hygeine techniques. See website sleep.org for more information.   PLANNING Prepare estate planning, living will, healthcare POA documents. Sometimes this is best planned with the help of an attorney. Theconversationproject.org and agingwithdignity.org are excellent resources.

## 2016-01-18 NOTE — Progress Notes (Signed)
[facility]  PATIENT: [name] DOB: [birth year]  REFERRING CLINICIAN: D Vyas HISTORY FROM: patient (review of outside notes) REASON FOR VISIT: follow up   HISTORICAL  CHIEF COMPLAINT:  Chief Complaint  Patient presents with  . Thoracic myelopathy    rm 7, "T5&6 surgery one week ago, legs still numb/heavy"  . Follow-up    1 month    HISTORY OF PRESENT ILLNESS:   UPDATE 01/18/16: Since last visit, had T5 myelopathy confirmed on MRI, and then urgent eval by Dr. Carloyn Manner. Patient had T5-T6 decompression and fusion on 01/12/16. He did well post-op, and now back home. Some fluctuating weakness in legs, but overall better than pre-op.   PRIOR HPI (12/18/15): 60 year old right-handed male here for evaluation of numbness and weakness from chest down to feet. 3 months ago patient noticed some pain in his legs. He was having some intermittent muscle Cramping. Around 6 weeks ago, beginning  Feb 2017, patient had significant and sudden onset of weakness and heaviness in his hips, legs, with numbness from his middle chest all the way down to his toes. Patient having more problems with balance and walking. Patient was evaluated by PCP who ordered MRI of the lumbar spine which showed multilevel degenerative changes. Patient was also suspected to have thoracic myelopathy and referred to me for further evaluation. Patient has had some gradual onset of frequent urination and mild impotence over past several years, but not significant worsened since past 6 weeks. He has no significant weakness in his hands or arms. No significant neck pain. No significant change in vision, speech or headaches. Patient having some intermittent electrical jolts and spasms when he moves his head or spine.   REVIEW OF SYSTEMS: Full 14 system review of systems performed and negative with exception of: only as per HPI.   ALLERGIES: No Known Allergies  HOME MEDICATIONS: Outpatient Prescriptions Prior to Visit   Medication Sig Dispense Refill  . lisinopril (PRINIVIL,ZESTRIL) 10 MG tablet Take 10 mg by mouth daily.  3  . naproxen sodium (ANAPROX) 220 MG tablet Take 220 mg by mouth 2 (two) times daily with a meal.    . predniSONE (DELTASONE) 10 MG tablet Take 60mg  on day 1. Reduce by 10mg  each subsequent day. (60, 50, 40, 30, 20, 10, stop) 21 tablet 0   Facility-Administered Medications Prior to Visit  Medication Dose Route Frequency Provider Last Rate Last Dose  . gadopentetate dimeglumine (MAGNEVIST) injection 20 mL  20 mL Intravenous Once PRN Penni Bombard, MD        PAST MEDICAL HISTORY: Past Medical History  Diagnosis Date  . Hypertension   . Kidney stones   . GERD (gastroesophageal reflux disease)   . Bulging lumbar disc     PAST SURGICAL HISTORY: Past Surgical History  Procedure Laterality Date  . Knee arthroscopy Left 2006  . Hand surgery    . Appendectomy    . Colonoscopy N/A 01/06/2016    Procedure: COLONOSCOPY;  Surgeon: Rogene Houston, MD;  [facility];  Service: Endoscopy;  Laterality: N/A;  730  . Back surgery  01/12/16    T 5&6    FAMILY HISTORY: Family History  Problem Relation Age of Onset  . Hypertension Mother   . Hypertension Father   . Hypertension Sister   . Hypertension Brother     SOCIAL HISTORY:  Social History   Social History  . Marital Status: Widowed    Spouse Name: N/A  . Number  of Children: 2  . Years of Education: 13   Occupational History  .      Crop Production   Social History Main Topics  . Smoking status: Current Every Day Smoker -- 1.00 packs/day for 40 years  . Smokeless tobacco: Not on file  . Alcohol Use: Yes     Comment: very little; occasionally  . Drug Use: No  . Sexual Activity: Not on file   Other Topics Concern  . Not on file   Social History Narrative   Mother lives with patient   Caffeine use- 3-4 glasses tea     PHYSICAL EXAM  GENERAL EXAM/CONSTITUTIONAL: Vitals:  Filed Vitals:    01/18/16 1539  BP: 135/79  Pulse: 97  Height: 6' (1.829 m)  Weight: 248 lb (112.492 kg)   Body mass index is 33.63 kg/(m^2). No exam data present  Patient is in no distress; well developed, nourished and groomed; neck is supple  CARDIOVASCULAR:  Examination of carotid arteries is normal; no carotid bruits  Regular rate and rhythm, no murmurs  Examination of peripheral vascular system by observation and palpation is normal  EYES:  Ophthalmoscopic exam of optic discs and posterior segments is normal; no papilledema or hemorrhages  MUSCULOSKELETAL:  Gait, strength, tone, movements noted in Neurologic exam below  NEUROLOGIC: MENTAL STATUS:  No flowsheet data found.  awake, alert, oriented to person, place and time  recent and remote memory intact  normal attention and concentration  language fluent, comprehension intact, naming intact  fund of knowledge appropriate  CRANIAL NERVE:   2nd - no papilledema on fundoscopic exam  2nd, 3rd, 4th, 6th - pupils equal and reactive to light, visual fields full to confrontation, extraocular muscles intact, no nystagmus  5th - facial sensation symmetric  7th - facial strength symmetric  8th - hearing intact  9th - palate elevates symmetrically, uvula midline  11th - shoulder shrug symmetric  12th - tongue protrusion midline  MOTOR:   normal bulk and tone, full strength in the BUE; EXCEPT BLE HF 3+, KE/KF 4, DF 4  SENSORY:   normal and symmetric to light touch; DECR PP FROM T5 DOWN TO FEET; VIB ~ 7 SEC AT TOES  COORDINATION:   finger-nose-finger, fine finger movements normal  REFLEXES:   deep tendon reflexes BUE 2, KNEES 3, ANKLES 2  GAIT/STATION:   SLOW CAUTIOUS GAIT; SLIGHTLY UNSTEADY    DIAGNOSTIC DATA (LABS, IMAGING, TESTING) - I reviewed patient records, labs, notes, testing and imaging myself where available.  No results found for: WBC, HGB, HCT, MCV, PLT No results found for: NA, K, CL, CO2,  GLUCOSE, BUN, CREATININE, CALCIUM, PROT, ALBUMIN, AST, ALT, ALKPHOS, BILITOT, GFRNONAA, GFRAA No results found for: CHOL, HDL, LDLCALC, LDLDIRECT, TRIG, CHOLHDL No results found for: HGBA1C Lab Results  Component Value Date   VITAMINB12 199* 12/18/2015   VIT D, 25-HYDROXY  Date Value Ref Range Status  12/18/2015 19.9* 30.0 - 100.0 ng/mL Final    Comment:    Vitamin D deficiency has been defined by the [place] and an Endocrine Society practice guideline as a level of serum 25-OH vitamin D less than 20 ng/mL (1,2). The Endocrine Society went on to further define vitamin D insufficiency as a level between 21 and 29 ng/mL (2). 1. IOM (Institute of Medicine). 2010. Dietary reference    intakes for calcium and D. [place]: The    [employer]. 2. Holick MF, Binkley , Bischoff-Ferrari HA, et al.  Evaluation, treatment, and prevention of vitamin D    deficiency: an Endocrine Society clinical practice    guideline. JCEM. 2011 Jul; 96(7):1911-30.    No results found for: TSH   12/08/15 MRI lumbar spine [I reviewed images myself and agree with interpretation. -VRP]  - Congenitally narrowed canal with superimposed degenerative changes which have progressed since the prior exam. Summary of pertinent findings includes: - T10-11 mild to moderate bilateral foraminal narrowing. - T11-12 bulge with ventral thecal sac narrowing and mild posterior cord displacement. Kyphosis centered at this level. Axial images not obtained. - L1-2 multifactorial moderate spinal stenosis and minimal bilateral foraminal narrowing. - L2-3 multifactorial moderate spinal stenosis and minimal bilateral foraminal narrowing. - L3-4 multifactorial moderate to marked spinal stenosis and mild to moderate bilateral foraminal narrowing. - L4-5 multifactorial moderate spinal stenosis and mild to moderate bilateral foraminal narrowing. - L5-S1 facet degenerative changes. - Atherosclerotic changes  of the abdominal aorta with dilation suspected but incompletely assessed on present exam. Ectatic common iliac arteries measuring up to 1.8 cm on the left. CT angiogram or ultrasound can be obtained for further delineation. - Right renal cysts incompletely assessed on present exam.  12/30/15 MRI thoracic spine (with and without) [I reviewed images myself and agree with interpretation. -VRP]  1. At T5-6: facet and ligamentum flavum hypertrophy with moderate spinal stenosis and moderate left foraminal stenosis; T2 hyperintense signal noted within the spinal cord may reflect cord edema or gliosis. 2. At T6-7: disc bulging and facet and ligamentum flavum hypertrophy with mild spinal stenosis, moderate right and severe left foraminal stenosis 3. At T8-9: facet hypertrophy with mild spinal stenosis and severe biforaminal stenosis 4. At T7-8: facet hypertrophy with moderate biforaminal stenosis 5. At T9-10: facet and ligamentum flavum hypertrophy with moderate right and mild left foraminal stenosis  6. At T10-11: facet and ligamentum flavum hypertrophy with mild right and moderate left foraminal stenosis  7. At T11-12: disc bulging and facet hypertrophy with mild left foraminal stenosis    ASSESSMENT AND PLAN  60 y.o. year old male here with progressive lower extremity pain and cramps, with more severe worsening of weakness and numbness from chest down to feet 6 weeks ago. Exam notable for mild weakness of lower extremities, brisk reflexes throughout, slightly increased tone in lower extremities, decreased sensation from T5 down to the toes.   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 MRI thoracic spine confirmed T5-6 myelopathy. Also T6-7 and T8-9 mild spinal stenosis. Now s/p decompression and fusion (T5-T6) on 01/12/16. Doing slightly better. Hopefully with time and recovery he will do better.  Also with Vit D and B12 deficiency. Advised to start vitamin replacement and follow up with  PCP.    Dx:  1. Thoracic spondylosis with myelopathy   2. B12 deficiency   3. Vitamin D deficiency     PLAN: - follow up with Dr. Carloyn Manner ([place]) and Dr. Woody Seller (PCP)  Return if symptoms worsen or fail to improve, for return to Dr. Woody Seller and Dr. Carloyn Manner.      Penni Bombard, MD [id number], [id number] PM Certified in Neurology, Neurophysiology and Neuroimaging  [facility] [address], [place], [place] [address] [phone]

## 2016-04-09 HISTORY — PX: BACK SURGERY: SHX140

## 2017-01-16 DIAGNOSIS — Z0289 Encounter for other administrative examinations: Secondary | ICD-10-CM

## 2017-02-07 ENCOUNTER — Ambulatory Visit (INDEPENDENT_AMBULATORY_CARE_PROVIDER_SITE_OTHER): Payer: BLUE CROSS/BLUE SHIELD | Admitting: Orthopedic Surgery

## 2017-02-07 ENCOUNTER — Ambulatory Visit (INDEPENDENT_AMBULATORY_CARE_PROVIDER_SITE_OTHER): Payer: BLUE CROSS/BLUE SHIELD

## 2017-02-07 ENCOUNTER — Encounter: Payer: Self-pay | Admitting: Orthopedic Surgery

## 2017-02-07 VITALS — BP 124/80 | HR 89 | Wt 251.0 lb

## 2017-02-07 DIAGNOSIS — M1712 Unilateral primary osteoarthritis, left knee: Secondary | ICD-10-CM

## 2017-02-07 DIAGNOSIS — M19071 Primary osteoarthritis, right ankle and foot: Secondary | ICD-10-CM

## 2017-02-07 DIAGNOSIS — M79671 Pain in right foot: Secondary | ICD-10-CM

## 2017-02-07 DIAGNOSIS — M25562 Pain in left knee: Secondary | ICD-10-CM

## 2017-02-07 NOTE — Progress Notes (Signed)
NEW PATIENT OFFICE VISIT    Chief Complaint  Patient presents with  . Knee Pain    left knee pain  . Foot Pain    right foot pain    61 year old male presents with a long-standing history of pain in his right foot which is recurrent and worsened associated with dorsolateral foot pain which is mild to moderate associated with weightbearing  #2 he complains of new onset pain in his left knee over the medial side which is a dull ache it seems to be constant gradual in onset. His artery on diclofenac 75 mg twice a day    Review of Systems  Musculoskeletal:       Treated for myelopathy secondary to thoracic disc required second surgery for fusion has decreased sensation in his lower extremities and weakness with gait disturbance  Neurological: Positive for sensory change and focal weakness.     Past Medical History:  Diagnosis Date  . Bulging lumbar disc   . GERD (gastroesophageal reflux disease)   . Hypertension   . Kidney stones     Past Surgical History:  Procedure Laterality Date  . APPENDECTOMY    . BACK SURGERY  01/12/16   T 5&6  . COLONOSCOPY N/A 01/06/2016   Procedure: COLONOSCOPY;  Surgeon: Rogene Houston, MD;  [facility];  Service: Endoscopy;  Laterality: N/A;  730  . HAND SURGERY    . KNEE ARTHROSCOPY Left 2006    Family History  Problem Relation Age of Onset  . Hypertension Mother   . Hypertension Father   . Hypertension Sister   . Hypertension Brother    Social History  Substance Use Topics  . Smoking status: Current Every Day Smoker    Packs/day: 1.00    Years: 40.00  . Smokeless tobacco: Never Used  . Alcohol use Yes     Comment: very little; occasionally    BP 124/80   Pulse 89   Wt 251 lb (113.9 kg)   BMI 34.04 kg/m   Physical Exam  Constitutional: He appears well-developed and well-nourished.  Vital signs have been reviewed and are stable. Gen. appearance the patient is well-developed and well-nourished with normal grooming  and hygiene. The patient is  oriented 3 with normal mood and affect.  Musculoskeletal:       Left knee: He exhibits no effusion.  Vitals reviewed.   Left Knee Exam   Tenderness  The patient is experiencing tenderness in the medial joint line.  Range of Motion  Extension: normal  Flexion: normal   Muscle Strength   The patient has normal left knee strength.  Tests  Lachman:  Anterior - negative    Posterior - negative Varus: negative Valgus: negative Patellar Apprehension: negative  Other  Erythema: absent Sensation: decreased Pulse: present Swelling: none Effusion: no effusion present      Right foot Tenderness dorsolateral foot swelling mild Ankle range of motion normal Muscle tone normal without atrophy of the foot Ankle stability normal anterior drawer Sensation normal Pulse normal Skin normal     Meds ordered this encounter  Medications  . gabapentin (NEURONTIN) 300 MG capsule    Sig: Take 300 mg by mouth 3 (three) times daily.    Encounter Diagnoses  Name Primary?  . Right foot pain Yes  . Left knee pain, unspecified chronicity   . Arthritis of right foot   . Primary osteoarthritis of left knee      PLAN:   Imaging of the right  foot reveal midfoot arthritis and a plantar spur  On the image of the left knee we see osteoarthritis of the medial compartment with possible chondrocalcinosis of the lateral meniscus.  Procedure note left knee injection verbal consent was obtained to inject left knee joint  Timeout was completed to confirm the site of injection  The medications used were 40 mg of Depo-Medrol and 1% lidocaine 3 cc  Anesthesia was provided by ethyl chloride and the skin was prepped with alcohol.  After cleaning the skin with alcohol a 20-gauge needle was used to inject the left knee joint. There were no complications. A sterile bandage was applied.  Orthotics right foot   Follow as needed

## 2017-02-07 NOTE — Patient Instructions (Signed)

## 2017-08-17 DIAGNOSIS — R292 Abnormal reflex: Secondary | ICD-10-CM | POA: Insufficient documentation

## 2017-12-04 ENCOUNTER — Other Ambulatory Visit: Payer: Self-pay | Admitting: Dermatology

## 2018-07-18 DIAGNOSIS — Z6832 Body mass index (BMI) 32.0-32.9, adult: Secondary | ICD-10-CM | POA: Diagnosis not present

## 2018-07-18 DIAGNOSIS — M4714 Other spondylosis with myelopathy, thoracic region: Secondary | ICD-10-CM | POA: Diagnosis not present

## 2018-07-18 DIAGNOSIS — R292 Abnormal reflex: Secondary | ICD-10-CM | POA: Diagnosis not present

## 2018-07-31 DIAGNOSIS — M25552 Pain in left hip: Secondary | ICD-10-CM | POA: Diagnosis not present

## 2018-07-31 DIAGNOSIS — M47816 Spondylosis without myelopathy or radiculopathy, lumbar region: Secondary | ICD-10-CM | POA: Diagnosis not present

## 2018-07-31 DIAGNOSIS — I700 Atherosclerosis of aorta: Secondary | ICD-10-CM | POA: Diagnosis not present

## 2018-07-31 DIAGNOSIS — S79912A Unspecified injury of left hip, initial encounter: Secondary | ICD-10-CM | POA: Diagnosis not present

## 2018-07-31 DIAGNOSIS — S3992XA Unspecified injury of lower back, initial encounter: Secondary | ICD-10-CM | POA: Diagnosis not present

## 2018-07-31 DIAGNOSIS — M545 Low back pain: Secondary | ICD-10-CM | POA: Diagnosis not present

## 2018-09-20 DIAGNOSIS — Z6833 Body mass index (BMI) 33.0-33.9, adult: Secondary | ICD-10-CM | POA: Diagnosis not present

## 2018-09-20 DIAGNOSIS — M4710 Other spondylosis with myelopathy, site unspecified: Secondary | ICD-10-CM | POA: Diagnosis not present

## 2018-09-20 DIAGNOSIS — I10 Essential (primary) hypertension: Secondary | ICD-10-CM | POA: Diagnosis not present

## 2018-09-20 DIAGNOSIS — Z2821 Immunization not carried out because of patient refusal: Secondary | ICD-10-CM | POA: Diagnosis not present

## 2018-09-20 DIAGNOSIS — F17210 Nicotine dependence, cigarettes, uncomplicated: Secondary | ICD-10-CM | POA: Diagnosis not present

## 2018-09-20 DIAGNOSIS — Z299 Encounter for prophylactic measures, unspecified: Secondary | ICD-10-CM | POA: Diagnosis not present

## 2018-10-02 DIAGNOSIS — R531 Weakness: Secondary | ICD-10-CM | POA: Diagnosis not present

## 2018-10-02 DIAGNOSIS — M4710 Other spondylosis with myelopathy, site unspecified: Secondary | ICD-10-CM | POA: Diagnosis not present

## 2018-10-02 DIAGNOSIS — R262 Difficulty in walking, not elsewhere classified: Secondary | ICD-10-CM | POA: Diagnosis not present

## 2018-10-12 DIAGNOSIS — R531 Weakness: Secondary | ICD-10-CM | POA: Diagnosis not present

## 2018-10-12 DIAGNOSIS — M4710 Other spondylosis with myelopathy, site unspecified: Secondary | ICD-10-CM | POA: Diagnosis not present

## 2018-10-12 DIAGNOSIS — R262 Difficulty in walking, not elsewhere classified: Secondary | ICD-10-CM | POA: Diagnosis not present

## 2018-10-15 DIAGNOSIS — R531 Weakness: Secondary | ICD-10-CM | POA: Diagnosis not present

## 2018-10-15 DIAGNOSIS — M4710 Other spondylosis with myelopathy, site unspecified: Secondary | ICD-10-CM | POA: Diagnosis not present

## 2018-10-15 DIAGNOSIS — R262 Difficulty in walking, not elsewhere classified: Secondary | ICD-10-CM | POA: Diagnosis not present

## 2018-10-17 DIAGNOSIS — R531 Weakness: Secondary | ICD-10-CM | POA: Diagnosis not present

## 2018-10-17 DIAGNOSIS — M4710 Other spondylosis with myelopathy, site unspecified: Secondary | ICD-10-CM | POA: Diagnosis not present

## 2018-10-17 DIAGNOSIS — R262 Difficulty in walking, not elsewhere classified: Secondary | ICD-10-CM | POA: Diagnosis not present

## 2018-10-18 DIAGNOSIS — M4714 Other spondylosis with myelopathy, thoracic region: Secondary | ICD-10-CM | POA: Diagnosis not present

## 2018-10-18 DIAGNOSIS — Z6833 Body mass index (BMI) 33.0-33.9, adult: Secondary | ICD-10-CM | POA: Diagnosis not present

## 2018-10-18 DIAGNOSIS — R292 Abnormal reflex: Secondary | ICD-10-CM | POA: Diagnosis not present

## 2018-10-22 DIAGNOSIS — R262 Difficulty in walking, not elsewhere classified: Secondary | ICD-10-CM | POA: Diagnosis not present

## 2018-10-22 DIAGNOSIS — R531 Weakness: Secondary | ICD-10-CM | POA: Diagnosis not present

## 2018-10-22 DIAGNOSIS — M4710 Other spondylosis with myelopathy, site unspecified: Secondary | ICD-10-CM | POA: Diagnosis not present

## 2018-10-24 DIAGNOSIS — R531 Weakness: Secondary | ICD-10-CM | POA: Diagnosis not present

## 2018-10-24 DIAGNOSIS — R262 Difficulty in walking, not elsewhere classified: Secondary | ICD-10-CM | POA: Diagnosis not present

## 2018-10-24 DIAGNOSIS — M4710 Other spondylosis with myelopathy, site unspecified: Secondary | ICD-10-CM | POA: Diagnosis not present

## 2018-10-26 DIAGNOSIS — M4714 Other spondylosis with myelopathy, thoracic region: Secondary | ICD-10-CM | POA: Diagnosis not present

## 2018-10-26 DIAGNOSIS — I10 Essential (primary) hypertension: Secondary | ICD-10-CM | POA: Diagnosis not present

## 2018-10-26 DIAGNOSIS — Z6833 Body mass index (BMI) 33.0-33.9, adult: Secondary | ICD-10-CM | POA: Diagnosis not present

## 2018-10-29 DIAGNOSIS — R262 Difficulty in walking, not elsewhere classified: Secondary | ICD-10-CM | POA: Diagnosis not present

## 2018-10-29 DIAGNOSIS — R531 Weakness: Secondary | ICD-10-CM | POA: Diagnosis not present

## 2018-10-29 DIAGNOSIS — M4710 Other spondylosis with myelopathy, site unspecified: Secondary | ICD-10-CM | POA: Diagnosis not present

## 2018-10-31 DIAGNOSIS — R531 Weakness: Secondary | ICD-10-CM | POA: Diagnosis not present

## 2018-10-31 DIAGNOSIS — M4710 Other spondylosis with myelopathy, site unspecified: Secondary | ICD-10-CM | POA: Diagnosis not present

## 2018-10-31 DIAGNOSIS — R262 Difficulty in walking, not elsewhere classified: Secondary | ICD-10-CM | POA: Diagnosis not present

## 2018-11-08 DIAGNOSIS — M4710 Other spondylosis with myelopathy, site unspecified: Secondary | ICD-10-CM | POA: Diagnosis not present

## 2018-11-08 DIAGNOSIS — R531 Weakness: Secondary | ICD-10-CM | POA: Diagnosis not present

## 2018-11-08 DIAGNOSIS — R262 Difficulty in walking, not elsewhere classified: Secondary | ICD-10-CM | POA: Diagnosis not present

## 2018-11-14 DIAGNOSIS — R262 Difficulty in walking, not elsewhere classified: Secondary | ICD-10-CM | POA: Diagnosis not present

## 2018-11-14 DIAGNOSIS — M4710 Other spondylosis with myelopathy, site unspecified: Secondary | ICD-10-CM | POA: Diagnosis not present

## 2018-11-14 DIAGNOSIS — R531 Weakness: Secondary | ICD-10-CM | POA: Diagnosis not present

## 2018-11-16 DIAGNOSIS — R531 Weakness: Secondary | ICD-10-CM | POA: Diagnosis not present

## 2018-11-16 DIAGNOSIS — R262 Difficulty in walking, not elsewhere classified: Secondary | ICD-10-CM | POA: Diagnosis not present

## 2018-11-16 DIAGNOSIS — M4710 Other spondylosis with myelopathy, site unspecified: Secondary | ICD-10-CM | POA: Diagnosis not present

## 2018-11-19 DIAGNOSIS — M4710 Other spondylosis with myelopathy, site unspecified: Secondary | ICD-10-CM | POA: Diagnosis not present

## 2018-11-19 DIAGNOSIS — R262 Difficulty in walking, not elsewhere classified: Secondary | ICD-10-CM | POA: Diagnosis not present

## 2018-11-19 DIAGNOSIS — R531 Weakness: Secondary | ICD-10-CM | POA: Diagnosis not present

## 2019-02-25 DIAGNOSIS — H524 Presbyopia: Secondary | ICD-10-CM | POA: Diagnosis not present

## 2019-02-25 DIAGNOSIS — D3132 Benign neoplasm of left choroid: Secondary | ICD-10-CM | POA: Diagnosis not present

## 2019-08-09 DIAGNOSIS — E7800 Pure hypercholesterolemia, unspecified: Secondary | ICD-10-CM | POA: Diagnosis not present

## 2019-08-09 DIAGNOSIS — Z299 Encounter for prophylactic measures, unspecified: Secondary | ICD-10-CM | POA: Diagnosis not present

## 2019-08-09 DIAGNOSIS — Z713 Dietary counseling and surveillance: Secondary | ICD-10-CM | POA: Diagnosis not present

## 2019-08-09 DIAGNOSIS — I10 Essential (primary) hypertension: Secondary | ICD-10-CM | POA: Diagnosis not present

## 2019-08-09 DIAGNOSIS — Z6831 Body mass index (BMI) 31.0-31.9, adult: Secondary | ICD-10-CM | POA: Diagnosis not present

## 2019-09-03 DIAGNOSIS — F17210 Nicotine dependence, cigarettes, uncomplicated: Secondary | ICD-10-CM | POA: Diagnosis not present

## 2019-09-03 DIAGNOSIS — Z299 Encounter for prophylactic measures, unspecified: Secondary | ICD-10-CM | POA: Diagnosis not present

## 2019-09-03 DIAGNOSIS — Z713 Dietary counseling and surveillance: Secondary | ICD-10-CM | POA: Diagnosis not present

## 2019-09-03 DIAGNOSIS — I10 Essential (primary) hypertension: Secondary | ICD-10-CM | POA: Diagnosis not present

## 2019-09-03 DIAGNOSIS — Z6831 Body mass index (BMI) 31.0-31.9, adult: Secondary | ICD-10-CM | POA: Diagnosis not present

## 2019-09-04 DIAGNOSIS — R0789 Other chest pain: Secondary | ICD-10-CM | POA: Diagnosis not present

## 2019-09-04 DIAGNOSIS — I714 Abdominal aortic aneurysm, without rupture: Secondary | ICD-10-CM | POA: Diagnosis not present

## 2019-09-04 DIAGNOSIS — Z6831 Body mass index (BMI) 31.0-31.9, adult: Secondary | ICD-10-CM | POA: Diagnosis not present

## 2019-09-04 DIAGNOSIS — R079 Chest pain, unspecified: Secondary | ICD-10-CM | POA: Diagnosis not present

## 2019-09-04 DIAGNOSIS — Z299 Encounter for prophylactic measures, unspecified: Secondary | ICD-10-CM | POA: Diagnosis not present

## 2019-09-04 DIAGNOSIS — I10 Essential (primary) hypertension: Secondary | ICD-10-CM | POA: Diagnosis not present

## 2019-09-16 DIAGNOSIS — R0789 Other chest pain: Secondary | ICD-10-CM | POA: Diagnosis not present

## 2019-09-16 DIAGNOSIS — R079 Chest pain, unspecified: Secondary | ICD-10-CM | POA: Diagnosis not present

## 2019-11-11 DIAGNOSIS — Z299 Encounter for prophylactic measures, unspecified: Secondary | ICD-10-CM | POA: Diagnosis not present

## 2019-11-11 DIAGNOSIS — I10 Essential (primary) hypertension: Secondary | ICD-10-CM | POA: Diagnosis not present

## 2019-11-11 DIAGNOSIS — F17210 Nicotine dependence, cigarettes, uncomplicated: Secondary | ICD-10-CM | POA: Diagnosis not present

## 2019-11-11 DIAGNOSIS — E7800 Pure hypercholesterolemia, unspecified: Secondary | ICD-10-CM | POA: Diagnosis not present

## 2019-11-11 DIAGNOSIS — I714 Abdominal aortic aneurysm, without rupture: Secondary | ICD-10-CM | POA: Diagnosis not present

## 2019-11-11 DIAGNOSIS — Z6831 Body mass index (BMI) 31.0-31.9, adult: Secondary | ICD-10-CM | POA: Diagnosis not present

## 2019-12-24 DIAGNOSIS — I10 Essential (primary) hypertension: Secondary | ICD-10-CM | POA: Diagnosis not present

## 2019-12-24 DIAGNOSIS — Z299 Encounter for prophylactic measures, unspecified: Secondary | ICD-10-CM | POA: Diagnosis not present

## 2019-12-24 DIAGNOSIS — E7800 Pure hypercholesterolemia, unspecified: Secondary | ICD-10-CM | POA: Diagnosis not present

## 2019-12-24 DIAGNOSIS — K219 Gastro-esophageal reflux disease without esophagitis: Secondary | ICD-10-CM | POA: Diagnosis not present

## 2019-12-24 DIAGNOSIS — Z2821 Immunization not carried out because of patient refusal: Secondary | ICD-10-CM | POA: Diagnosis not present

## 2019-12-24 DIAGNOSIS — N529 Male erectile dysfunction, unspecified: Secondary | ICD-10-CM | POA: Diagnosis not present

## 2020-01-15 ENCOUNTER — Ambulatory Visit (INDEPENDENT_AMBULATORY_CARE_PROVIDER_SITE_OTHER): Payer: Medicare Other | Admitting: Dermatology

## 2020-01-15 ENCOUNTER — Encounter (INDEPENDENT_AMBULATORY_CARE_PROVIDER_SITE_OTHER): Payer: Self-pay

## 2020-01-15 ENCOUNTER — Other Ambulatory Visit: Payer: Self-pay

## 2020-01-15 DIAGNOSIS — L821 Other seborrheic keratosis: Secondary | ICD-10-CM

## 2020-01-15 DIAGNOSIS — L570 Actinic keratosis: Secondary | ICD-10-CM

## 2020-01-15 DIAGNOSIS — L578 Other skin changes due to chronic exposure to nonionizing radiation: Secondary | ICD-10-CM | POA: Diagnosis not present

## 2020-01-15 DIAGNOSIS — D492 Neoplasm of unspecified behavior of bone, soft tissue, and skin: Secondary | ICD-10-CM

## 2020-01-15 DIAGNOSIS — D692 Other nonthrombocytopenic purpura: Secondary | ICD-10-CM | POA: Diagnosis not present

## 2020-01-15 DIAGNOSIS — D485 Neoplasm of uncertain behavior of skin: Secondary | ICD-10-CM

## 2020-01-15 NOTE — Patient Instructions (Addendum)
Biopsy, Surgery (Curettage) & Surgery (Excision) Aftercare Instructions  1. Okay to remove bandage in 24 hours  2. Wash area with soap and water  3. Apply Vaseline to area twice daily until healed (Not Neosporin)  4. Okay to cover with a Band-Aid to decrease the chance of infection or prevent irritation from clothing; also it's okay to uncover lesion at home.  5. Suture instructions: return to our office in 7-10 or 10-14 days for a nurse visit for suture removal. Variable healing with sutures, if pain or itching occurs call our office. It's okay to shower or bathe 24 hours after sutures are given.  6. The following risks may occur after a biopsy, curettage or excision: bleeding, scarring, discoloration, recurrence, infection (redness, yellow drainage, pain or swelling).  7. For questions, concerns and results call our office at [place] before 4pm & Friday before 3pm. Biopsy results will be available in 1 week.  First follow-up in several years for [name].  He is most concerned with growth on his scalp.  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  After shave biopsy the base was treated with curettage plus electrosurgery.  He understands there is risk of scar or recurrence.  I have asked Leon Mitchell to either check on MyChart in 2 days or call my office.  The rest of his waist up skin examination showed no atypical moles or other sign of skin cancer.  The 2 toned rough spot on his left chest is stable and with dermoscopy shows a typical seborrheic keratosis.  There is minor sun damage on the top of the scalp but nothing that currently requires freezing.  If the scalp lesion heals well, routine check in 1 year.  The skin on both forearms tears and bruises easily indicating solar purpura; he may try over-the-counter Dermend to see if this will help.

## 2020-01-16 ENCOUNTER — Encounter: Payer: Self-pay | Admitting: Dermatology

## 2020-01-16 NOTE — Progress Notes (Signed)
   Follow-Up Visit   Subjective  Leon Mitchell is a 64 y.o. male who presents for the following: Skin Problem (Here to check spots on patients scalp. Crusty spots that have been there for awhile. Check spot on left chest looks like an SK patient says it is irritating. ).  Growths chest and scalp Location: chest years, scalp months Duration:  Quality: chest stable, scalp larger Associated Signs/Symptoms: scalp sensitive Modifying Factors:  Severity:  Timing: Context:   The following portions of the chart were reviewed this encounter and updated as appropriate:     Objective  Well appearing patient in no apparent distress; mood and affect are within normal limits.  All skin waist up examined.   Assessment & Plan  Neoplasm of skin Mid Parietal Scalp  Skin / nail biopsy Type of biopsy: tangential   Anesthesia: the lesion was anesthetized in a standard fashion   Anesthetic:  1% lidocaine w/ epinephrine 1-100,000 local infiltration Instrument used: flexible razor blade   Hemostasis achieved with: ferric subsulfate   Outcome: patient tolerated procedure well   Post-procedure details: sterile dressing applied and wound care instructions given   Dressing type: petrolatum   Additional details:  Patient identified lesion of concern.  Lesion identified by physician.  Specimen 1 - Surgical pathology Differential Diagnosis: scc vs bcc Check Margins: No Txpbx--curetx1 and cautery  First follow-up in several years for [name].  He is most concerned with growth on his scalp.  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  After shave biopsy the base was treated with curettage plus electrosurgery.  He understands there is risk of scar or recurrence.  I have asked Leon Mitchell to either check on MyChart in 2 days or call my office.  The rest of his waist up skin examination showed no atypical moles or other sign of skin cancer.  The  2 toned rough spot on his left chest is stable and with dermoscopy shows a typical seborrheic keratosis.  There is minor sun damage on the top of the scalp but nothing that currently requires freezing.  If the scalp lesion heals well, routine check in 1 year.  The skin on both forearms tears and bruises easily indicating solar purpura; he may try over-the-counter Dermend to see if this will help. Encouraged to stop smoking.

## 2020-01-17 DIAGNOSIS — Z23 Encounter for immunization: Secondary | ICD-10-CM | POA: Diagnosis not present

## 2020-02-05 DIAGNOSIS — Z23 Encounter for immunization: Secondary | ICD-10-CM | POA: Diagnosis not present

## 2020-02-10 DIAGNOSIS — Z743 Need for continuous supervision: Secondary | ICD-10-CM | POA: Diagnosis not present

## 2020-02-10 DIAGNOSIS — M25511 Pain in right shoulder: Secondary | ICD-10-CM | POA: Diagnosis not present

## 2020-03-16 DIAGNOSIS — Z299 Encounter for prophylactic measures, unspecified: Secondary | ICD-10-CM | POA: Diagnosis not present

## 2020-03-16 DIAGNOSIS — I10 Essential (primary) hypertension: Secondary | ICD-10-CM | POA: Diagnosis not present

## 2020-03-16 DIAGNOSIS — F17210 Nicotine dependence, cigarettes, uncomplicated: Secondary | ICD-10-CM | POA: Diagnosis not present

## 2020-03-16 DIAGNOSIS — H6690 Otitis media, unspecified, unspecified ear: Secondary | ICD-10-CM | POA: Diagnosis not present

## 2020-03-25 DIAGNOSIS — Z6830 Body mass index (BMI) 30.0-30.9, adult: Secondary | ICD-10-CM | POA: Diagnosis not present

## 2020-03-25 DIAGNOSIS — Z720 Tobacco use: Secondary | ICD-10-CM | POA: Diagnosis not present

## 2020-03-25 DIAGNOSIS — Z299 Encounter for prophylactic measures, unspecified: Secondary | ICD-10-CM | POA: Diagnosis not present

## 2020-03-25 DIAGNOSIS — I10 Essential (primary) hypertension: Secondary | ICD-10-CM | POA: Diagnosis not present

## 2020-03-25 DIAGNOSIS — I714 Abdominal aortic aneurysm, without rupture: Secondary | ICD-10-CM | POA: Diagnosis not present

## 2020-03-26 ENCOUNTER — Ambulatory Visit: Payer: Medicare Other

## 2020-03-26 ENCOUNTER — Other Ambulatory Visit (HOSPITAL_COMMUNITY): Payer: Self-pay | Admitting: Nurse Practitioner

## 2020-03-26 ENCOUNTER — Other Ambulatory Visit: Payer: Self-pay

## 2020-03-26 ENCOUNTER — Ambulatory Visit (INDEPENDENT_AMBULATORY_CARE_PROVIDER_SITE_OTHER): Payer: Medicare Other | Admitting: Orthopedic Surgery

## 2020-03-26 ENCOUNTER — Encounter: Payer: Self-pay | Admitting: Orthopedic Surgery

## 2020-03-26 VITALS — BP 133/81 | HR 61 | Wt 214.0 lb

## 2020-03-26 DIAGNOSIS — G8929 Other chronic pain: Secondary | ICD-10-CM | POA: Diagnosis not present

## 2020-03-26 DIAGNOSIS — I714 Abdominal aortic aneurysm, without rupture, unspecified: Secondary | ICD-10-CM

## 2020-03-26 DIAGNOSIS — M25562 Pain in left knee: Secondary | ICD-10-CM | POA: Diagnosis not present

## 2020-03-26 DIAGNOSIS — D3132 Benign neoplasm of left choroid: Secondary | ICD-10-CM | POA: Diagnosis not present

## 2020-03-26 DIAGNOSIS — H524 Presbyopia: Secondary | ICD-10-CM | POA: Diagnosis not present

## 2020-03-26 DIAGNOSIS — F17219 Nicotine dependence, cigarettes, with unspecified nicotine-induced disorders: Secondary | ICD-10-CM

## 2020-03-26 DIAGNOSIS — Z720 Tobacco use: Secondary | ICD-10-CM

## 2020-03-26 DIAGNOSIS — F17200 Nicotine dependence, unspecified, uncomplicated: Secondary | ICD-10-CM

## 2020-03-26 NOTE — Patient Instructions (Signed)
See neurology   Come back 6 weeks for the knee

## 2020-03-26 NOTE — Progress Notes (Signed)
Monovisc lot [id number] 12/07/2021
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
## 2020-04-10 ENCOUNTER — Ambulatory Visit: Payer: Medicare Other

## 2020-04-10 ENCOUNTER — Encounter: Payer: Self-pay | Admitting: Orthopedic Surgery

## 2020-04-10 ENCOUNTER — Ambulatory Visit (INDEPENDENT_AMBULATORY_CARE_PROVIDER_SITE_OTHER): Payer: Medicare Other | Admitting: Orthopedic Surgery

## 2020-04-10 ENCOUNTER — Other Ambulatory Visit: Payer: Self-pay

## 2020-04-10 VITALS — BP 132/91 | HR 61 | Ht 72.0 in | Wt 214.0 lb

## 2020-04-10 DIAGNOSIS — G8929 Other chronic pain: Secondary | ICD-10-CM

## 2020-04-10 DIAGNOSIS — M25511 Pain in right shoulder: Secondary | ICD-10-CM
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
## 2020-04-15 ENCOUNTER — Ambulatory Visit (INDEPENDENT_AMBULATORY_CARE_PROVIDER_SITE_OTHER): Payer: Medicare Other | Admitting: Diagnostic Neuroimaging

## 2020-04-15 ENCOUNTER — Encounter: Payer: Self-pay | Admitting: Diagnostic Neuroimaging

## 2020-04-15 ENCOUNTER — Other Ambulatory Visit: Payer: Self-pay

## 2020-04-15 VITALS — BP 135/83 | HR 54 | Ht 72.0 in | Wt 217.0 lb

## 2020-04-15 DIAGNOSIS — M25562 Pain in left knee: Secondary | ICD-10-CM

## 2020-04-15 DIAGNOSIS — M4714 Other spondylosis with myelopathy, thoracic region: Secondary | ICD-10-CM

## 2020-04-15 DIAGNOSIS — G8929 Other chronic pain: Secondary | ICD-10-CM | POA: Diagnosis not present

## 2020-04-15 NOTE — Progress Notes (Signed)
[facility]  PATIENT: Leon Mitchell DOB: 28-Dec-1955  REFERRING CLINICIAN: Carole Civil, MD  HISTORY FROM: patient REASON FOR VISIT: follow up   HISTORICAL  CHIEF COMPLAINT:  Chief Complaint  Patient presents with  . Pain    rm 7 New Pt "chronic pain in left knee since 2006 w/torn meniscus"    HISTORY OF PRESENT ILLNESS:   UPDATE (04/15/20, VRP): Since last visit, had another thoracic spine surgery in July 2017. Since then doing well except having left knee pain (past 10 years; now more pain in last 1-2 years). Symptoms are progressive. Severity is moderate. No alleviating or aggravating factors. Ortho clinic planning for left knee surgery.   UPDATE 01/18/16: Since last visit, had T5 myelopathy confirmed on MRI, and then urgent eval by Dr. Carloyn Manner. Patient had T5-T6 decompression and fusion on 01/12/16. He did well post-op, and now back home. Some fluctuating weakness in legs, but overall better than pre-op.   PRIOR HPI (12/18/15): 64 year old right-handed male here for evaluation of numbness and weakness from chest down to feet. 3 months ago patient noticed some pain in his legs. He was having some intermittent muscle Cramping. Around 6 weeks ago, beginning  Feb 2017, patient had significant and sudden onset of weakness and heaviness in his hips, legs, with numbness from his middle chest all the way down to his toes. Patient having more problems with balance and walking. Patient was evaluated by PCP who ordered MRI of the lumbar spine which showed multilevel degenerative changes. Patient was also suspected to have thoracic myelopathy and referred to me for further evaluation. Patient has had some gradual onset of frequent urination and mild impotence over past several years, but not significant worsened since past 6 weeks. He has no significant weakness in his hands or arms. No significant neck pain. No significant change in vision, speech or headaches. Patient having some  intermittent electrical jolts and spasms when he moves his head or spine.   REVIEW OF SYSTEMS: Full 14 system review of systems performed and negative with exception of: only as per HPI.   ALLERGIES: No Known Allergies  HOME MEDICATIONS: Outpatient Medications Prior to Visit  Medication Sig Dispense Refill  . diclofenac (VOLTAREN) 75 MG EC tablet Take by mouth.    . gabapentin (NEURONTIN) 300 MG capsule Take 300 mg by mouth 3 (three) times daily.    [name] lisinopril (PRINIVIL,ZESTRIL) 10 MG tablet Take 10 mg by mouth daily.  3  . omeprazole (PRILOSEC) 40 MG capsule      Facility-Administered Medications Prior to Visit  Medication Dose Route Frequency Provider Last Rate Last Admin  . gadopentetate dimeglumine (MAGNEVIST) injection 20 mL  20 mL Intravenous Once PRN Rica Heather, Earlean Polka, MD        PAST MEDICAL HISTORY: Past Medical History:  Diagnosis Date  . Bulging lumbar disc   . GERD (gastroesophageal reflux disease)   . Hypertension   . Kidney stones     PAST SURGICAL HISTORY: Past Surgical History:  Procedure Laterality Date  . APPENDECTOMY    . BACK SURGERY  01/12/16   T 5&6  . BACK SURGERY  04/2016   T 6-9  . COLONOSCOPY N/A 01/06/2016   Procedure: COLONOSCOPY;  Surgeon: Rogene Houston, MD;  [facility]: [facility];  Service: Endoscopy;  Laterality: N/A;  730  . HAND SURGERY    . KNEE ARTHROSCOPY Left 2006    FAMILY HISTORY: Family History  Problem Relation Age of Onset  .  Hypertension Mother   . Hypertension Father   . Hypertension Sister   . Hypertension Brother     SOCIAL HISTORY:  Social History   Socioeconomic History  . Marital status: Widowed    Spouse name: Not on file  . Number of children: 2  . Years of education: 64  . Highest education level: Not on file  Occupational History    Comment: Product manager, disability  Tobacco Use  . Smoking status: Current Every Day Smoker    Packs/day: 1.00    Years: 40.00    Pack years: 40.00  .  Smokeless tobacco: Never Used  Substance and Sexual Activity  . Alcohol use: Yes    Comment: very little; occasionally  . Drug use: No  . Sexual activity: Not on file  Other Topics Concern  . Not on file  Social History Narrative   Mother lives with patient   Caffeine use- 3-4 glasses tea   Social Determinants of Health   Financial Resource Strain:   . Difficulty of Paying Living Expenses:   Food Insecurity:   . Worried About Charity fundraiser in the Last Year:   . Arboriculturist in the Last Year:   Transportation Needs:   . Film/video editor (Medical):   [name] Lack of Transportation (Non-Medical):   Physical Activity:   . Days of Exercise per Week:   . Minutes of Exercise per Session:   Stress:   . Feeling of Stress :   Social Connections:   . Frequency of Communication with Friends and Family:   . Frequency of Social Gatherings with Friends and Family:   . Attends Religious Services:   . Active Member of Clubs or Organizations:   . Attends Archivist Meetings:   [name] Marital Status:   Intimate Partner Violence:   . Fear of Current or Ex-Partner:   . Emotionally Abused:   [name] Physically Abused:   . Sexually Abused:      PHYSICAL EXAM  GENERAL EXAM/CONSTITUTIONAL: Vitals:  Vitals:   04/15/20 1401  BP: 135/83  Pulse: (!) 54  Weight: 217 lb (98.4 kg)  Height: 6' (1.829 m)   Body mass index is 29.43 kg/m. No exam data present  Patient is in no distress; well developed, nourished and groomed; neck is supple  CARDIOVASCULAR:  Examination of carotid arteries is normal; no carotid bruits  Regular rate and rhythm, no murmurs  Examination of peripheral vascular system by observation and palpation is normal  EYES:  Ophthalmoscopic exam of optic discs and posterior segments is normal; no papilledema or hemorrhages  MUSCULOSKELETAL:  Gait, strength, tone, movements noted in Neurologic exam below  NEUROLOGIC: MENTAL STATUS:  No flowsheet data  found.  awake, alert, oriented to person, place and time  recent and remote memory intact  normal attention and concentration  language fluent, comprehension intact, naming intact  fund of knowledge appropriate  CRANIAL NERVE:   2nd - no papilledema on fundoscopic exam  2nd, 3rd, 4th, 6th - pupils equal and reactive to light, visual fields full to confrontation, extraocular muscles intact, no nystagmus  5th - facial sensation symmetric  7th - facial strength symmetric  8th - hearing intact  9th - palate elevates symmetrically, uvula midline  11th - shoulder shrug symmetric  12th - tongue protrusion midline  MOTOR:   normal bulk and tone, full strength in the BUE; EXCEPT BLE HF 4+, KE/KF 5, DF 5  SENSORY:   normal and symmetric  to light touch; DECR PP FROM T5 DOWN TO FEET; VIB REDUCED IN TOES  COORDINATION:   finger-nose-finger, fine finger movements normal  REFLEXES:   deep tendon reflexes BUE 2, KNEES 3, ANKLES 2  GAIT/STATION:   SLOW CAUTIOUS GAIT; SLIGHTLY UNSTEADY    DIAGNOSTIC DATA (LABS, IMAGING, TESTING) - I reviewed patient records, labs, notes, testing and imaging myself where available.  No results found for: WBC, HGB, HCT, MCV, PLT No results found for: NA, K, CL, CO2, GLUCOSE, BUN, CREATININE, CALCIUM, PROT, ALBUMIN, AST, ALT, ALKPHOS, BILITOT, GFRNONAA, GFRAA No results found for: CHOL, HDL, LDLCALC, LDLDIRECT, TRIG, CHOLHDL No results found for: HGBA1C Lab Results  Component Value Date   VITAMINB12 199 (L) 12/18/2015   Vit D, 25-Hydroxy  Date Value Ref Range Status  12/18/2015 19.9 (L) 30.0 - 100.0 ng/mL Final    Comment:    Vitamin D deficiency has been defined by the [place] and an Endocrine Society practice guideline as a level of serum 25-OH vitamin D less than 20 ng/mL (1,2). The Endocrine Society went on to further define vitamin D insufficiency as a level between 21 and 29 ng/mL (2). 1. IOM (Institute of  Medicine). 2010. Dietary reference    intakes for calcium and D. [place]: The    [employer]. 2. Holick MF, Binkley [place], Bischoff-Ferrari HA, et al.    Evaluation, treatment, and prevention of vitamin D    deficiency: an Endocrine Society clinical practice    guideline. JCEM. 2011 Jul; 96(7):1911-30.    No results found for: TSH   12/08/15 MRI lumbar spine [I reviewed images myself and agree with interpretation. -VRP]  - Congenitally narrowed canal with superimposed degenerative changes which have progressed since the prior exam. Summary of pertinent findings includes: - T10-11 mild to moderate bilateral foraminal narrowing.  - T11-12 bulge with ventral thecal sac narrowing and mild posterior cord displacement. Kyphosis centered at this level. Axial images not obtained. - L1-2 multifactorial moderate spinal stenosis and minimal bilateral foraminal narrowing. - L2-3 multifactorial moderate spinal stenosis and minimal bilateral foraminal narrowing. - L3-4 multifactorial moderate to marked spinal stenosis and mild to moderate bilateral foraminal narrowing. - L4-5 multifactorial moderate spinal stenosis and mild to moderate bilateral foraminal narrowing. - L5-S1 facet degenerative changes. - Atherosclerotic changes of the abdominal aorta with dilation suspected but incompletely assessed on present exam. Ectatic common iliac arteries measuring up to 1.8 cm on the left. CT angiogram or ultrasound can be obtained for further delineation. - Right renal cysts incompletely assessed on present exam.  12/30/15 MRI thoracic spine (with and without) [I reviewed images myself and agree with interpretation. -VRP]  1. At T5-6: facet and ligamentum flavum hypertrophy with moderate spinal stenosis and moderate left foraminal stenosis; T2 hyperintense signal noted within the spinal cord may reflect cord edema or gliosis. 2. At T6-7: disc bulging and facet and ligamentum flavum hypertrophy with  mild spinal stenosis, moderate right and severe left foraminal stenosis 3. At T8-9: facet hypertrophy with mild spinal stenosis and severe biforaminal stenosis 4. At T7-8: facet hypertrophy with moderate biforaminal stenosis 5. At T9-10: facet and ligamentum flavum hypertrophy with moderate right and mild left foraminal stenosis  6. At T10-11: facet and ligamentum flavum hypertrophy with mild right and moderate left foraminal stenosis  7. At T11-12: disc bulging and facet hypertrophy with mild left foraminal stenosis    ASSESSMENT AND PLAN  64 y.o. year old male here with progressive lower extremity pain and cramps,  with more severe worsening of weakness and numbness from chest down to feet 6 weeks ago. Exam notable for mild weakness of lower extremities, brisk reflexes throughout, slightly increased tone in lower extremities, decreased sensation from T5 down to the toes.   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 MRI thoracic spine confirmed T5-6 myelopathy. Also T6-7 and T8-9 mild spinal stenosis. Now s/p decompression and fusion (T5-T6) on 01/12/16. Doing slightly better. Hopefully with time and recovery he will do better.  Also with Vit D and B12 deficiency. Advised to start vitamin replacement and follow up with PCP.   Dx:  1. Thoracic spondylosis with myelopathy   2. Chronic pain of left knee     PLAN:  LEFT KNEE PAIN - follow up with orthopedic surgery; no neurologic contraindication for surgery  Return for pending if symptoms worsen or fail to improve, return to PCP.      Penni Bombard, MD 12/14/6438, 3:47 PM Certified in Neurology, Neurophysiology and Neuroimaging  [facility] [address] [place], [place] [address] [phone]

## 2020-04-17 ENCOUNTER — Ambulatory Visit (HOSPITAL_COMMUNITY)
Admission: RE | Admit: 2020-04-17 | Discharge: 2020-04-17 | Disposition: A | Discharge status: Home / Self Care | Payer: Medicare Other | Source: Ambulatory Visit | Attending: Nurse Practitioner | Admitting: Nurse Practitioner

## 2020-04-17 ENCOUNTER — Other Ambulatory Visit: Payer: Self-pay

## 2020-04-17 DIAGNOSIS — Z720 Tobacco use: Secondary | ICD-10-CM | POA: Diagnosis not present

## 2020-04-17 DIAGNOSIS — F17219 Nicotine dependence, cigarettes, with unspecified nicotine-induced disorders: Secondary | ICD-10-CM | POA: Diagnosis present

## 2020-04-20 DIAGNOSIS — H25013 Cortical age-related cataract, bilateral: Secondary | ICD-10-CM | POA: Diagnosis not present

## 2020-04-20 DIAGNOSIS — H43393 Other vitreous opacities, bilateral: Secondary | ICD-10-CM | POA: Diagnosis not present

## 2020-04-20 DIAGNOSIS — H35033 Hypertensive retinopathy, bilateral: Secondary | ICD-10-CM | POA: Diagnosis not present

## 2020-04-20 DIAGNOSIS — H25043 Posterior subcapsular polar age-related cataract, bilateral: Secondary | ICD-10-CM | POA: Diagnosis not present

## 2020-04-20 DIAGNOSIS — H2513 Age-related nuclear cataract, bilateral: Secondary | ICD-10-CM | POA: Diagnosis not present

## 2020-04-20 DIAGNOSIS — H2511 Age-related nuclear cataract, right eye: Secondary | ICD-10-CM | POA: Diagnosis not present

## 2020-04-23 ENCOUNTER — Other Ambulatory Visit: Payer: Self-pay

## 2020-04-23 ENCOUNTER — Ambulatory Visit (HOSPITAL_COMMUNITY)
Admission: RE | Admit: 2020-04-23 | Discharge: 2020-04-23 | Disposition: A | Discharge status: Home / Self Care | Payer: Medicare Other | Source: Ambulatory Visit | Attending: Orthopedic Surgery | Admitting: Orthopedic Surgery

## 2020-04-23 DIAGNOSIS — G8929 Other chronic pain: Secondary | ICD-10-CM | POA: Diagnosis not present

## 2020-04-23 DIAGNOSIS — M25511 Pain in right shoulder: Secondary | ICD-10-CM | POA: Diagnosis not present

## 2020-04-28 DIAGNOSIS — H2511 Age-related nuclear cataract, right eye: Secondary | ICD-10-CM | POA: Diagnosis not present

## 2020-04-28 DIAGNOSIS — H25041 Posterior subcapsular polar age-related cataract, right eye: Secondary | ICD-10-CM | POA: Diagnosis not present

## 2020-04-28 DIAGNOSIS — H25011 Cortical age-related cataract, right eye: Secondary | ICD-10-CM | POA: Diagnosis not present

## 2020-04-30 ENCOUNTER — Encounter: Payer: Self-pay | Admitting: Orthopedic Surgery

## 2020-04-30 ENCOUNTER — Ambulatory Visit (INDEPENDENT_AMBULATORY_CARE_PROVIDER_SITE_OTHER): Payer: Medicare Other | Admitting: Orthopedic Surgery

## 2020-04-30 ENCOUNTER — Other Ambulatory Visit: Payer: Self-pay

## 2020-04-30 VITALS — BP 152/85 | HR 66 | Ht 72.0 in | Wt 217.0 lb

## 2020-04-30 DIAGNOSIS — G8929 Other chronic pain: Secondary | ICD-10-CM | POA: Diagnosis not present

## 2020-04-30 DIAGNOSIS — S46811D Strain of other muscles, fascia and tendons at shoulder and upper arm level, right arm, subsequent encounter: Secondary | ICD-10-CM | POA: Diagnosis not present

## 2020-04-30 DIAGNOSIS — M19011 Primary osteoarthritis, right shoulder: Secondary | ICD-10-CM

## 2020-04-30 DIAGNOSIS — M25511 Pain in right shoulder: Secondary | ICD-10-CM | POA: Diagnosis not present
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
## 2020-04-30 NOTE — Patient Instructions (Signed)
Dr Marlou Sa office will call you with appointment  His office is [phone]   Their office address is [place]

## 2020-05-05 DIAGNOSIS — H2511 Age-related nuclear cataract, right eye: Secondary | ICD-10-CM | POA: Diagnosis not present

## 2020-05-07 DIAGNOSIS — Z7189 Other specified counseling: Secondary | ICD-10-CM | POA: Diagnosis not present

## 2020-05-07 DIAGNOSIS — Z6830 Body mass index (BMI) 30.0-30.9, adult: Secondary | ICD-10-CM | POA: Diagnosis not present

## 2020-05-07 DIAGNOSIS — R5383 Other fatigue: Secondary | ICD-10-CM | POA: Diagnosis not present

## 2020-05-07 DIAGNOSIS — Z79899 Other long term (current) drug therapy: Secondary | ICD-10-CM | POA: Diagnosis not present

## 2020-05-07 DIAGNOSIS — I10 Essential (primary) hypertension: Secondary | ICD-10-CM | POA: Diagnosis not present

## 2020-05-07 DIAGNOSIS — E7800 Pure hypercholesterolemia, unspecified: Secondary | ICD-10-CM | POA: Diagnosis not present

## 2020-05-07 DIAGNOSIS — Z1331 Encounter for screening for depression: Secondary | ICD-10-CM | POA: Diagnosis not present

## 2020-05-07 DIAGNOSIS — E559 Vitamin D deficiency, unspecified: Secondary | ICD-10-CM | POA: Diagnosis not present

## 2020-05-07 DIAGNOSIS — Z1339 Encounter for screening examination for other mental health and behavioral disorders: Secondary | ICD-10-CM | POA: Diagnosis not present

## 2020-05-07 DIAGNOSIS — Z125 Encounter for screening for malignant neoplasm of prostate: Secondary | ICD-10-CM | POA: Diagnosis not present

## 2020-05-07 DIAGNOSIS — Z0000 Encounter for general adult medical examination without abnormal findings: Secondary | ICD-10-CM | POA: Diagnosis not present

## 2020-05-07 DIAGNOSIS — Z299 Encounter for prophylactic measures, unspecified: Secondary | ICD-10-CM | POA: Diagnosis not present

## 2020-05-14 ENCOUNTER — Ambulatory Visit (INDEPENDENT_AMBULATORY_CARE_PROVIDER_SITE_OTHER): Payer: Medicare Other | Admitting: Orthopedic Surgery

## 2020-05-14 DIAGNOSIS — M12811 Other specific arthropathies, not elsewhere classified, right shoulder: Secondary | ICD-10-CM | POA: Diagnosis not present

## 2020-05-15 ENCOUNTER — Encounter: Payer: Self-pay | Admitting: Orthopedic Surgery
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
## 2020-05-25 DIAGNOSIS — H2512 Age-related nuclear cataract, left eye: Secondary | ICD-10-CM | POA: Diagnosis not present

## 2020-05-25 DIAGNOSIS — H25012 Cortical age-related cataract, left eye: Secondary | ICD-10-CM | POA: Diagnosis not present

## 2020-05-25 DIAGNOSIS — H25042 Posterior subcapsular polar age-related cataract, left eye: Secondary | ICD-10-CM | POA: Diagnosis not present

## 2020-06-02 DIAGNOSIS — H25012 Cortical age-related cataract, left eye: Secondary | ICD-10-CM | POA: Diagnosis not present

## 2020-06-02 DIAGNOSIS — H25812 Combined forms of age-related cataract, left eye: Secondary | ICD-10-CM | POA: Diagnosis not present

## 2020-06-02 DIAGNOSIS — H2512 Age-related nuclear cataract, left eye: Secondary | ICD-10-CM | POA: Diagnosis not present

## 2020-06-02 DIAGNOSIS — H25042 Posterior subcapsular polar age-related cataract, left eye: Secondary | ICD-10-CM | POA: Diagnosis not present

## 2020-06-09 DIAGNOSIS — H2512 Age-related nuclear cataract, left eye: Secondary | ICD-10-CM | POA: Diagnosis not present

## 2020-06-17 ENCOUNTER — Encounter: Payer: Self-pay | Admitting: Internal Medicine

## 2020-06-17 ENCOUNTER — Other Ambulatory Visit: Payer: Self-pay

## 2020-06-17 ENCOUNTER — Ambulatory Visit (INDEPENDENT_AMBULATORY_CARE_PROVIDER_SITE_OTHER): Payer: Medicare Other | Admitting: Internal Medicine

## 2020-06-17 DIAGNOSIS — J449 Chronic obstructive pulmonary disease, unspecified: Secondary | ICD-10-CM | POA: Diagnosis not present

## 2020-06-17 DIAGNOSIS — I10 Essential (primary) hypertension: Secondary | ICD-10-CM | POA: Insufficient documentation

## 2020-06-17 DIAGNOSIS — R911 Solitary pulmonary nodule: Secondary | ICD-10-CM | POA: Diagnosis not present

## 2020-06-17 DIAGNOSIS — F17210 Nicotine dependence, cigarettes, uncomplicated: Secondary | ICD-10-CM | POA: Diagnosis not present

## 2020-06-17 NOTE — Assessment & Plan Note (Signed)
Consider try off acei 06/17/2020 for throat clearing  He says wife died of lung cancer with same cough but turns out she was also on ACEi and I doubt his lung nodule, though could prove to be malignant,  Has anything with his cough which is more typical of upper airway source:  ACEi  effects at the  top of the usual list of suspects and the only way to rule it out is a trial off >  Monitor bp and call for ARB substitute if drifting up (may not as has been tapering lisinopril down on his own to only 2.5 mg daily ) > f/u PCP

## 2020-06-17 NOTE — Assessment & Plan Note (Addendum)
Counseled re importance of smoking cessation but did not meet time criteria for separate billing     Medical decision making was a moderate level of complexity in this case because of  > Two  conditions /diagnoses requiring extra time for  H and P, chart review, counseling,   and generating customized AVS unique to this office visit and charting.   Each maintenance medication was reviewed in detail including emphasizing most importantly the difference between maintenance and prns and under what circumstances the prns are to be triggered using an action plan format where appropriate. Please see avs for details which were reviewed in writing by both me and my nurse and patient given a written copy highlighted where appropriate with yellow highlighter for the patient's continued care at home along with an updated version of their medications.  Patient was asked to maintain medication reconciliation by comparing this list to the actual medications being used at home and to contact this office right away if there is a conflict or discrepancy.

## 2020-06-17 NOTE — Progress Notes (Signed)
Leon Mitchell, male    DOB: [birth year],    MRN: [id number]   Brief patient profile:  59 yowm active smoker more limited by back/ leg weakness p back surgery around July 17 and cough x years = dry hack on ACEi referred to pulmonary clinic in [place]  06/17/2020 by Dr   Woody Seller re spn RUL    History of Present Illness  06/17/2020  Pulmonary/ 1st office eval/ Taysen Bushart / [place] [facility]  Chief Complaint  Patient presents with  . Consult    complains of smokers cough  Dyspnea:  Limited by back and legs more than breathing  Cough: as above / min rattle in am / dry hack with urge to clear throat daytime  Sleep: fine flat on side / one pillowunder head  SABA use: none   No obvious day to day or daytime variability or assoc excess/ purulent sputum or mucus plugs or hemoptysis or cp or chest tightness, subjective wheeze or overt sinus or hb symptoms.   Sleeping  without nocturnal  or early am exacerbation  of respiratory  c/o's or need for noct saba. Also denies any obvious fluctuation of symptoms with weather or environmental changes or other aggravating or alleviating factors except as outlined above   No unusual exposure hx or h/o childhood pna/ asthma or knowledge of premature birth.  Current Allergies, Complete Past Medical History, Past Surgical History, Family History, and Social History were reviewed in [employer] record.  ROS  The following are not active complaints unless bolded Hoarseness, sore throat, dysphagia, dental problems, itching, sneezing,  nasal congestion or discharge of excess mucus or purulent secretions, ear ache,   fever, chills, sweats, unintended wt loss or wt gain, classically pleuritic or exertional cp,  orthopnea pnd or arm/hand swelling  or leg swelling, presyncope, palpitations, abdominal pain, anorexia, nausea, vomiting, diarrhea  or change in bowel habits or change in bladder habits, change in stools or change in urine, dysuria, hematuria,   rash, arthralgias, visual complaints, headache, numbness, weakness LLE p back surgery  or ataxia or problems with walking or coordination,  change in mood or  memory.           Past Medical History:  Diagnosis Date  . Bulging lumbar disc   . GERD (gastroesophageal reflux disease)   . Hypertension   . Kidney stones     Outpatient Medications Prior to Visit  Medication Sig Dispense Refill  . diclofenac (VOLTAREN) 75 MG EC tablet Take by mouth.    . gabapentin (NEURONTIN) 300 MG capsule Take 300 mg by mouth 3 (three) times daily.    [name] lisinopril (PRINIVIL,ZESTRIL) 10 MG tablet Only says he takes 2.5 mg daily     . omeprazole (PRILOSEC) 40 MG capsule     . Vitamin D, Ergocalciferol, (DRISDOL) 1.25 MG (50000 UNIT) CAPS capsule every 7 (seven) days.      Facility-Administered Medications Prior to Visit  Medication Dose Route Frequency Provider Last Rate Last Admin  . gadopentetate dimeglumine (MAGNEVIST) injection 20 mL  20 mL Intravenous Once PRN Penumalli, Vikram R, MD         Objective:     BP 126/76 (BP Location: Left Arm, Cuff Size: Normal)   Pulse 66   Temp (!) 97.1 F (36.2 C) (Other (Comment)) Comment (Src): wrist  Ht 6' (1.829 m)   Wt 213 lb 9.6 oz (96.9 kg)   SpO2 98% Comment: room air  BMI 28.97 kg/m   SpO2:  98 % (room air)   HEENT : pt wearing mask not removed for exam due to covid - 19 concerns.   NECK :  without JVD/Nodes/TM/ nl carotid upstrokes bilaterally   LUNGS: no acc muscle use,  Min barrel  contour chest wall with bilateral  slightly decreased bs s audible wheeze and  without cough on insp or exp maneuvers and min  Hyperresonant  to  percussion bilaterally     CV:  RRR  no s3 or murmur or increase in P2, and no edema   ABD:  soft and nontender with pos end  insp Hoover's  in the supine position. No bruits or organomegaly appreciated, bowel sounds nl  MS:   Nl gait/  ext warm without deformities, calf tenderness, cyanosis or clubbing No obvious  joint restrictions   SKIN: warm and dry without lesions    NEURO:  alert, approp, nl sensorium with  no motor or cerebellar deficits apparent.           I personally reviewed images and agree with radiology impression as follows:   Chest CT LCS  04/17/20 1. Lung-RADS 4A, suspicious 8.1 mm medial right upper lobe pulmonary nodule. Follow up low-dose chest CT without contrast in 3 months (please use the following order, "CT CHEST LCS NODULE FOLLOW-UP /O CM") is recommended. Alternatively, PET may be considered when there is a solid component 28mm or larger. 2. Aortic Atherosclerosis (ICD10-I70.0) and Emphysema (ICD10-J43.9). Assessment   COPD GOLD ?  / active smoker  Active smoker/ no meds (on ACEi) > rec try off 06/17/2020   Limited more by back / L LE radiculopathy but note has emphsema on CT chest 04/17/20 so needs pfts next    Solitary pulmonary nodule on lung CT Active smoker - RUL x  MMM detected 04/17/20  > rec repeat 07/18/20   CT results reviewed with pt >>> Too small for PET or bx, not suspicious enough for excisional bx > really only option for now is follow the Fleischner society guidelines as rec by radiology= repeat ct at 3 m = 07/18/20 and if growing >>> PET >> if pos excisional bx and perhaps RUL obectomy   Discussed in detail all the  indications, usual  risks and alternatives  relative to the benefits with patient who agrees to proceed with w/u as outlined.      Essential hypertension Consider try off acei 06/17/2020 for throat clearing  He says wife died of lung cancer with same cough but turns out she was also on ACEi and I doubt his lung nodule, though could prove to be malignant,  Has anything with his cough which is more typical of upper airway source:  ACEi  effects at the  top of the usual list of suspects and the only way to rule it out is a trial off >  Monitor bp and call for ARB substitute if drifting up (may not as has been tapering lisinopril down on his own to  only 2.5 mg daily ) > f/u PCP     Cigarette smoker Counseled re importance of smoking cessation but did not meet time criteria for separate billing      Medical decision making was a moderate level of complexity in this case because of  > Two  conditions /diagnoses requiring extra time for  H and P, chart review, counseling,   and generating customized AVS unique to this office visit and charting.   Each maintenance medication was reviewed in detail  including emphasizing most importantly the difference between maintenance and prns and under what circumstances the prns are to be triggered using an action plan format where appropriate. Please see avs for details which were reviewed in writing by both me and my nurse and patient given a written copy highlighted where appropriate with yellow highlighter for the patient's continued care at home along with an updated version of their medications.  Patient was asked to maintain medication reconciliation by comparing this list to the actual medications being used at home and to contact this office right away if there is a conflict or discrepancy.       Christinia Gully, MD 06/17/2020

## 2020-06-17 NOTE — Assessment & Plan Note (Signed)
Active smoker - RUL x  MMM detected 04/17/20  > rec repeat 07/18/20   CT results reviewed with pt >>> Too small for PET or bx, not suspicious enough for excisional bx > really only option for now is follow the Fleischner society guidelines as rec by radiology= repeat ct at 3 m = 07/18/20 and if growing >>> PET >> if pos excisional bx and perhaps RUL obectomy   Discussed in detail all the  indications, usual  risks and alternatives  relative to the benefits with patient who agrees to proceed with w/u as outlined.

## 2020-06-17 NOTE — Assessment & Plan Note (Signed)
Active smoker/ no meds (on ACEi) > rec try off 06/17/2020   Limited more by back / L LE radiculopathy but note has emphsema on CT chest 04/17/20 so needs pfts next

## 2020-06-17 NOTE — Patient Instructions (Addendum)
We will set you up with PFTs next available and I will call you the results.  The key is to stop smoking completely before smoking completely stops you!    Your lisinopril is the likely cause of your cough, not the copd and not the lung nodule and you need 6 weeks off to prove it and I can call you a substitute if needed - it's the only way to tell   My nurse will call you first week in October to be sure you get the CT follow and if it's growing you will need a PET scan if it's over 1.0 cm and surgical referral from there.

## 2020-07-16 ENCOUNTER — Other Ambulatory Visit: Payer: Self-pay | Admitting: Internal Medicine

## 2020-07-16 DIAGNOSIS — M4714 Other spondylosis with myelopathy, thoracic region: Secondary | ICD-10-CM | POA: Diagnosis not present

## 2020-07-16 DIAGNOSIS — M5416 Radiculopathy, lumbar region: Secondary | ICD-10-CM | POA: Diagnosis not present

## 2020-07-16 DIAGNOSIS — R911 Solitary pulmonary nodule: Secondary | ICD-10-CM

## 2020-07-28 ENCOUNTER — Other Ambulatory Visit (HOSPITAL_COMMUNITY): Payer: Self-pay | Admitting: Neurological Surgery

## 2020-07-28 DIAGNOSIS — M5416 Radiculopathy, lumbar region: Secondary | ICD-10-CM

## 2020-07-28 DIAGNOSIS — M4714 Other spondylosis with myelopathy, thoracic region: Secondary | ICD-10-CM

## 2020-07-29 ENCOUNTER — Ambulatory Visit
Admission: RE | Admit: 2020-07-29 | Discharge: 2020-07-29 | Disposition: A | Discharge status: Home / Self Care | Payer: Medicare Other | Source: Ambulatory Visit | Attending: Internal Medicine | Admitting: Internal Medicine

## 2020-07-29 ENCOUNTER — Other Ambulatory Visit: Payer: Self-pay | Admitting: Internal Medicine

## 2020-07-29 DIAGNOSIS — I700 Atherosclerosis of aorta: Secondary | ICD-10-CM | POA: Diagnosis not present

## 2020-07-29 DIAGNOSIS — I2510 Atherosclerotic heart disease of native coronary artery without angina pectoris: Secondary | ICD-10-CM | POA: Diagnosis not present

## 2020-07-29 DIAGNOSIS — R911 Solitary pulmonary nodule: Secondary | ICD-10-CM

## 2020-07-29 DIAGNOSIS — J8410 Pulmonary fibrosis, unspecified: Secondary | ICD-10-CM | POA: Diagnosis not present

## 2020-07-29 DIAGNOSIS — M47814 Spondylosis without myelopathy or radiculopathy, thoracic region: Secondary | ICD-10-CM | POA: Diagnosis not present

## 2020-08-05 ENCOUNTER — Other Ambulatory Visit: Payer: Self-pay | Admitting: Internal Medicine

## 2020-08-05 DIAGNOSIS — R911 Solitary pulmonary nodule: Secondary | ICD-10-CM

## 2020-08-05 NOTE — Progress Notes (Signed)
Pt notified of results/referral sent to lung ca screening.

## 2020-08-06 DIAGNOSIS — F17210 Nicotine dependence, cigarettes, uncomplicated: Secondary | ICD-10-CM | POA: Diagnosis not present

## 2020-08-06 DIAGNOSIS — Z2821 Immunization not carried out because of patient refusal: Secondary | ICD-10-CM | POA: Diagnosis not present

## 2020-08-06 DIAGNOSIS — I10 Essential (primary) hypertension: Secondary | ICD-10-CM | POA: Diagnosis not present

## 2020-08-06 DIAGNOSIS — M4804 Spinal stenosis, thoracic region: Secondary | ICD-10-CM | POA: Diagnosis not present

## 2020-08-06 DIAGNOSIS — Z6832 Body mass index (BMI) 32.0-32.9, adult: Secondary | ICD-10-CM | POA: Diagnosis not present

## 2020-08-06 DIAGNOSIS — Z299 Encounter for prophylactic measures, unspecified: Secondary | ICD-10-CM | POA: Diagnosis not present

## 2020-08-06 DIAGNOSIS — I714 Abdominal aortic aneurysm, without rupture: Secondary | ICD-10-CM | POA: Diagnosis not present

## 2020-08-07 ENCOUNTER — Other Ambulatory Visit (HOSPITAL_COMMUNITY)
Admission: RE | Admit: 2020-08-07 | Discharge: 2020-08-07 | Disposition: A | Discharge status: Home / Self Care | Payer: Medicare Other | Source: Ambulatory Visit | Attending: Internal Medicine | Admitting: Internal Medicine

## 2020-08-07 ENCOUNTER — Other Ambulatory Visit: Payer: Self-pay

## 2020-08-07 ENCOUNTER — Ambulatory Visit (HOSPITAL_COMMUNITY)
Admission: RE | Admit: 2020-08-07 | Discharge: 2020-08-07 | Disposition: A | Discharge status: Home / Self Care | Payer: Medicare Other | Source: Ambulatory Visit | Attending: Neurological Surgery | Admitting: Neurological Surgery

## 2020-08-07 DIAGNOSIS — M4804 Spinal stenosis, thoracic region: Secondary | ICD-10-CM | POA: Diagnosis not present

## 2020-08-07 DIAGNOSIS — M4726 Other spondylosis with radiculopathy, lumbar region: Secondary | ICD-10-CM | POA: Diagnosis not present

## 2020-08-07 DIAGNOSIS — M5416 Radiculopathy, lumbar region: Secondary | ICD-10-CM | POA: Insufficient documentation

## 2020-08-07 DIAGNOSIS — M4714 Other spondylosis with myelopathy, thoracic region: Secondary | ICD-10-CM

## 2020-08-07 DIAGNOSIS — K219 Gastro-esophageal reflux disease without esophagitis: Secondary | ICD-10-CM | POA: Diagnosis not present

## 2020-08-07 DIAGNOSIS — E559 Vitamin D deficiency, unspecified: Secondary | ICD-10-CM | POA: Diagnosis not present

## 2020-08-07 DIAGNOSIS — M4854XA Collapsed vertebra, not elsewhere classified, thoracic region, initial encounter for fracture: Secondary | ICD-10-CM | POA: Diagnosis not present

## 2020-08-07 DIAGNOSIS — Z20822 Contact with and (suspected) exposure to covid-19: Secondary | ICD-10-CM | POA: Insufficient documentation

## 2020-08-07 DIAGNOSIS — E7849 Other hyperlipidemia: Secondary | ICD-10-CM | POA: Diagnosis not present

## 2020-08-07 DIAGNOSIS — M5116 Intervertebral disc disorders with radiculopathy, lumbar region: Secondary | ICD-10-CM | POA: Diagnosis not present

## 2020-08-07 DIAGNOSIS — M48061 Spinal stenosis, lumbar region without neurogenic claudication: Secondary | ICD-10-CM | POA: Diagnosis not present

## 2020-08-07 DIAGNOSIS — Z8669 Personal history of other diseases of the nervous system and sense organs: Secondary | ICD-10-CM | POA: Diagnosis not present

## 2020-08-07 LAB — SARS CORONAVIRUS 2 (TAT 6-24 HRS): SARS Coronavirus 2: NEGATIVE

## 2020-08-11 ENCOUNTER — Other Ambulatory Visit: Payer: Self-pay

## 2020-08-11 ENCOUNTER — Ambulatory Visit (HOSPITAL_COMMUNITY)
Admission: RE | Admit: 2020-08-11 | Discharge: 2020-08-11 | Disposition: A | Discharge status: Home / Self Care | Payer: Medicare Other | Source: Ambulatory Visit | Attending: Internal Medicine | Admitting: Internal Medicine

## 2020-08-11 DIAGNOSIS — J449 Chronic obstructive pulmonary disease, unspecified: Secondary | ICD-10-CM | POA: Diagnosis not present

## 2020-08-11 LAB — PULMONARY FUNCTION TEST
DL/VA % pred: 90 %
DL/VA: 3.68 ml/min/mmHg/L
DLCO unc % pred: 87 %
DLCO unc: 26.12 ml/min/mmHg
FEF 25-75 Post: 2.55 L/sec
FEF 25-75 Pre: 2.49 L/sec
FEF2575-%Change-Post: 2 %
FEF2575-%Pred-Post: 83 %
FEF2575-%Pred-Pre: 81 %
FEV1-%Change-Post: 4 %
FEV1-%Pred-Post: 88 %
FEV1-%Pred-Pre: 84 %
FEV1-Post: 3.47 L
FEV1-Pre: 3.32 L
FEV1FVC-%Change-Post: 3 %
FEV1FVC-%Pred-Pre: 91 %
FEV6-%Change-Post: 0 %
FEV6-%Pred-Post: 96 %
FEV6-%Pred-Pre: 95 %
FEV6-Post: 4.81 L
FEV6-Pre: 4.77 L
FEV6FVC-%Change-Post: 0 %
FEV6FVC-%Pred-Post: 103 %
FEV6FVC-%Pred-Pre: 102 %
FVC-%Change-Post: 0 %
FVC-%Pred-Post: 93 %
FVC-%Pred-Pre: 92 %
FVC-Post: 4.91 L
FVC-Pre: 4.87 L
Post FEV1/FVC ratio: 71 %
Post FEV6/FVC ratio: 98 %
Pre FEV1/FVC ratio: 68 %
Pre FEV6/FVC Ratio: 98 %
RV % pred: 116 %
RV: 3.01 L
TLC % pred: 101 %
TLC: 7.94 L

## 2020-08-11 MED ORDER — ALBUTEROL SULFATE (2.5 MG/3ML) 0.083% IN NEBU
2.5000 mg | INHALATION_SOLUTION | Freq: Once | RESPIRATORY_TRACT | Status: AC
  Administered 2020-08-11: 2.5 mg via RESPIRATORY_TRACT

## 2020-09-16 DIAGNOSIS — M5416 Radiculopathy, lumbar region: Secondary | ICD-10-CM | POA: Diagnosis not present

## 2020-09-28 ENCOUNTER — Ambulatory Visit (INDEPENDENT_AMBULATORY_CARE_PROVIDER_SITE_OTHER): Payer: Medicare Other | Admitting: Orthopedic Surgery

## 2020-09-28 ENCOUNTER — Other Ambulatory Visit: Payer: Self-pay

## 2020-09-28 VITALS — BP 144/83 | HR 65 | Ht 72.0 in | Wt 213.0 lb

## 2020-09-28 DIAGNOSIS — M1712 Unilateral primary osteoarthritis, left knee: Secondary | ICD-10-CM

## 2020-09-28 DIAGNOSIS — M1710 Unilateral primary osteoarthritis, unspecified knee: Secondary | ICD-10-CM

## 2020-09-28 DIAGNOSIS — G8929 Other chronic pain: Secondary | ICD-10-CM

## 2020-09-28 NOTE — Progress Notes (Signed)
Chief Complaint  Patient presents with  . Follow-up    Recheck on left knee.  . Injections    Left knee Monovisc Lot [id number] exp 02 28 23    Injected left knee   monovisc   Verbal consent  Time out done   Injected one vial of monovisc  He s interested in PRP  No diagnosis found.

## 2020-10-06 DIAGNOSIS — M5416 Radiculopathy, lumbar region: Secondary | ICD-10-CM | POA: Diagnosis not present

## 2020-10-09 DIAGNOSIS — E7849 Other hyperlipidemia: Secondary | ICD-10-CM | POA: Diagnosis not present

## 2020-10-09 DIAGNOSIS — K219 Gastro-esophageal reflux disease without esophagitis: Secondary | ICD-10-CM | POA: Diagnosis not present

## 2020-10-09 DIAGNOSIS — E559 Vitamin D deficiency, unspecified: Secondary | ICD-10-CM | POA: Diagnosis not present

## 2020-10-20 DIAGNOSIS — L570 Actinic keratosis: Secondary | ICD-10-CM | POA: Diagnosis not present

## 2020-10-20 DIAGNOSIS — L821 Other seborrheic keratosis: Secondary | ICD-10-CM | POA: Diagnosis not present

## 2020-10-20 DIAGNOSIS — L814 Other melanin hyperpigmentation: Secondary | ICD-10-CM | POA: Diagnosis not present

## 2020-10-20 DIAGNOSIS — D1801 Hemangioma of skin and subcutaneous tissue: Secondary | ICD-10-CM | POA: Diagnosis not present

## 2020-10-20 DIAGNOSIS — D485 Neoplasm of uncertain behavior of skin: Secondary | ICD-10-CM | POA: Diagnosis not present

## 2020-10-21 DIAGNOSIS — M5416 Radiculopathy, lumbar region: Secondary | ICD-10-CM | POA: Diagnosis not present

## 2020-11-09 DIAGNOSIS — K219 Gastro-esophageal reflux disease without esophagitis: Secondary | ICD-10-CM | POA: Diagnosis not present

## 2020-11-09 DIAGNOSIS — E7849 Other hyperlipidemia: Secondary | ICD-10-CM | POA: Diagnosis not present

## 2020-11-09 DIAGNOSIS — E559 Vitamin D deficiency, unspecified: Secondary | ICD-10-CM | POA: Diagnosis not present

## 2020-11-12 DIAGNOSIS — M48062 Spinal stenosis, lumbar region with neurogenic claudication: Secondary | ICD-10-CM | POA: Diagnosis not present

## 2020-11-24 DIAGNOSIS — M48062 Spinal stenosis, lumbar region with neurogenic claudication: Secondary | ICD-10-CM | POA: Diagnosis not present

## 2020-11-24 DIAGNOSIS — M48061 Spinal stenosis, lumbar region without neurogenic claudication: Secondary | ICD-10-CM | POA: Diagnosis not present

## 2020-11-27 ENCOUNTER — Telehealth: Payer: Self-pay | Admitting: Radiology

## 2020-11-27 NOTE — Telephone Encounter (Signed)
Ok do we have phlebotomist and does arthrex know ?

## 2020-11-27 NOTE — Telephone Encounter (Signed)
He has PRP injection scheduled at 3:30 on March 1st  To you FYI

## 2020-11-30 ENCOUNTER — Ambulatory Visit: Payer: Medicare Other | Admitting: Orthopedic Surgery

## 2020-11-30 NOTE — Telephone Encounter (Signed)
HAVE TO HAVE SOMEONE WHO CAN DRAW BLOOD  MAKE SURE LEETA AND WENDY KNOW AND ARE HERE

## 2020-11-30 NOTE — Telephone Encounter (Signed)
Phlebotomist >? No, but have me, have drawn blood but not since I have been here, how much do I draw??   Thurmond Butts knows, he will be here.

## 2020-11-30 NOTE — Telephone Encounter (Signed)
I will be here and we will see if Leon Mitchell would be here. I assumed since I am Dr Aline Brochure assistant I would be drawing the blood for patient, but he wants to make sure Leon Mitchell is here too ?

## 2020-11-30 NOTE — Telephone Encounter (Signed)
Dr Aline Brochure wants to know if Leon Mitchell can draw blood, he does not want me to do it.

## 2020-12-07 DIAGNOSIS — E559 Vitamin D deficiency, unspecified: Secondary | ICD-10-CM | POA: Diagnosis not present

## 2020-12-07 DIAGNOSIS — K219 Gastro-esophageal reflux disease without esophagitis: Secondary | ICD-10-CM | POA: Diagnosis not present

## 2020-12-07 DIAGNOSIS — E7849 Other hyperlipidemia: Secondary | ICD-10-CM | POA: Diagnosis not present

## 2020-12-08 ENCOUNTER — Other Ambulatory Visit: Payer: Self-pay

## 2020-12-08 ENCOUNTER — Encounter: Payer: Self-pay | Admitting: Orthopedic Surgery

## 2020-12-08 ENCOUNTER — Ambulatory Visit: Payer: Medicare Other | Admitting: Orthopedic Surgery

## 2020-12-08 DIAGNOSIS — M1712 Unilateral primary osteoarthritis, left knee: Secondary | ICD-10-CM

## 2020-12-08 NOTE — Progress Notes (Signed)
Chief Complaint  Patient presents with  . Injections    PRP left knee    Prep for PRP injection   4.5 cc platelet rich plasma injected left knee   Encounter Diagnosis  Name Primary?  . Primary osteoarthritis of left knee

## 2020-12-09 ENCOUNTER — Ambulatory Visit: Payer: Medicare Other | Admitting: Orthopedic Surgery

## 2020-12-10 DIAGNOSIS — H35033 Hypertensive retinopathy, bilateral: Secondary | ICD-10-CM | POA: Diagnosis not present

## 2020-12-21 ENCOUNTER — Other Ambulatory Visit: Payer: Self-pay

## 2020-12-21 ENCOUNTER — Ambulatory Visit (INDEPENDENT_AMBULATORY_CARE_PROVIDER_SITE_OTHER): Payer: Medicare Other | Admitting: Orthopedic Surgery

## 2020-12-21 DIAGNOSIS — M12811 Other specific arthropathies, not elsewhere classified, right shoulder: Secondary | ICD-10-CM

## 2020-12-23 ENCOUNTER — Encounter: Payer: Self-pay | Admitting: Orthopedic Surgery

## 2020-12-23 NOTE — Progress Notes (Signed)
Office Visit Note   Patient: Leon Mitchell           Date of Birth: 1956/06/06           MRN: [id number] Visit Date: 12/21/2020 Requested by: Glenda Chroman, MD [place],  [place] [id number] PCP: Glenda Chroman, MD  Subjective: Chief Complaint  Patient presents with  . Right Shoulder - Pain    HPI: Leon Mitchell is a 65 year old patient with right shoulder pain.  Fell on his shoulder a month ago.  Reports constant pain which wakes him from sleep at night.  He is right-hand dominant.  Retired.  Takes diclofenac and Neurontin for other issues.  He had an MRI scan from July of last year which shows large full-thickness retracted tear of the subscap tendon with 3 cm of retraction as well as moderate to advanced glenohumeral joint degenerative change for age.  Also a small focus of osteonecrosis affecting the sphericity of the humeral head.  Overall Leon Mitchell is gradually becoming more symptomatic in the shoulder.  He is having significant pain but it something that he is able to more or less live with at this time.              ROS: All systems reviewed are negative as they relate to the chief complaint within the history of present illness.  Patient denies  fevers or chills.   Assessment & Plan: Visit Diagnoses:  1. Rotator cuff arthropathy, right     Plan: Impression is rotator cuff arthropathy right shoulder in a patient who has reasonably good function but primarily pain is presenting complaint.  We discussed the rationale with the use of models of reverse shoulder replacement.  Also discussed recommended lifetime lifting limits for this prosthesis.  He is going to consider his options and let me know if he wants to do anything moving forward.  We would need to get think that CT scanning closer to the time of surgery should he decide to pursue that.  Follow-up as needed  Follow-Up Instructions: Return if symptoms worsen or fail to improve.   Orders:  No orders of the defined types were placed in  this encounter.  No orders of the defined types were placed in this encounter.     Procedures: No procedures performed   Clinical Data: No additional findings.  Objective: Vital Signs: There were no vitals taken for this visit.  Physical Exam:   Constitutional: Patient appears well-developed HEENT:  Head: Normocephalic Eyes:EOM are normal Neck: Normal range of motion Cardiovascular: Normal rate Pulmonary/chest: Effort normal Neurologic: Patient is alert Skin: Skin is warm Psychiatric: Patient has normal mood and affect    Ortho Exam: Ortho exam demonstrates functional deltoid.  He is got subscap weakness on the right compared to the left.  Pretty good infraspinatus strength bilaterally.  Forward flexion abduction both slightly above 90 degrees but he is able to get his hand overhead.  I do not think he has much strength overhead.  Specialty Comments:  No specialty comments available.  Imaging: No results found.   PMFS History: Patient Active Problem List   Diagnosis Date Noted  . COPD GOLD 0  / active smoker  06/17/2020  . Solitary pulmonary nodule on lung CT 06/17/2020  . Cigarette smoker 06/17/2020  . Essential hypertension 06/17/2020  . 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 B12 deficiency 01/18/2016  . Vitamin D deficiency 01/18/2016  .  OA (osteoarthritis) of knee 06/05/2013  . Right knee pain 06/05/2013  . 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 Bulging lumbar disc   . GERD (gastroesophageal reflux disease)   . Hypertension   . Kidney stones     Family History  Problem Relation Age of Onset  . Hypertension Mother   . Hypertension Father   . Hypertension Sister   . Hypertension Brother     Past Surgical History:  Procedure Laterality Date  . APPENDECTOMY    . BACK SURGERY  01/12/16   T 5&6  . BACK SURGERY  04/2016   T 6-9  .  COLONOSCOPY N/A 01/06/2016   Procedure: COLONOSCOPY;  Surgeon: Rogene Houston, MD;  Location: [facility];  Service: Endoscopy;  Laterality: N/A;  730  . HAND SURGERY    . KNEE ARTHROSCOPY Left 2006   Social History   Occupational History    Comment: Product manager, disability  Tobacco Use  . Smoking status: Current Every Day Smoker    Packs/day: 1.00    Years: 40.00    Pack years: 40.00  . Smokeless tobacco: Never Used  Substance and Sexual Activity  . Alcohol use: Yes    Comment: very little; occasionally  . Drug use: No  . Sexual activity: Not on file

## 2021-03-08 DIAGNOSIS — E559 Vitamin D deficiency, unspecified: Secondary | ICD-10-CM | POA: Diagnosis not present

## 2021-03-08 DIAGNOSIS — E7849 Other hyperlipidemia: Secondary | ICD-10-CM | POA: Diagnosis not present

## 2021-03-08 DIAGNOSIS — K219 Gastro-esophageal reflux disease without esophagitis: Secondary | ICD-10-CM | POA: Diagnosis not present

## 2021-03-16 ENCOUNTER — Ambulatory Visit: Payer: Self-pay | Admitting: Urology

## 2021-03-24 DIAGNOSIS — M545 Low back pain, unspecified: Secondary | ICD-10-CM | POA: Diagnosis not present

## 2021-03-24 DIAGNOSIS — M47816 Spondylosis without myelopathy or radiculopathy, lumbar region: Secondary | ICD-10-CM | POA: Diagnosis not present

## 2021-03-24 DIAGNOSIS — I10 Essential (primary) hypertension: Secondary | ICD-10-CM | POA: Diagnosis not present

## 2021-03-24 DIAGNOSIS — M5416 Radiculopathy, lumbar region: Secondary | ICD-10-CM | POA: Diagnosis not present

## 2021-03-24 DIAGNOSIS — M549 Dorsalgia, unspecified: Secondary | ICD-10-CM | POA: Diagnosis not present

## 2021-03-24 DIAGNOSIS — R102 Pelvic and perineal pain: Secondary | ICD-10-CM | POA: Diagnosis not present

## 2021-03-24 DIAGNOSIS — R109 Unspecified abdominal pain: Secondary | ICD-10-CM | POA: Diagnosis not present

## 2021-03-24 DIAGNOSIS — M419 Scoliosis, unspecified: Secondary | ICD-10-CM | POA: Diagnosis not present

## 2021-03-24 DIAGNOSIS — F17210 Nicotine dependence, cigarettes, uncomplicated: Secondary | ICD-10-CM | POA: Diagnosis not present

## 2021-03-24 DIAGNOSIS — N200 Calculus of kidney: Secondary | ICD-10-CM | POA: Diagnosis not present

## 2021-03-24 DIAGNOSIS — Z299 Encounter for prophylactic measures, unspecified: Secondary | ICD-10-CM | POA: Diagnosis not present

## 2021-03-24 DIAGNOSIS — M160 Bilateral primary osteoarthritis of hip: Secondary | ICD-10-CM | POA: Diagnosis not present

## 2021-03-24 DIAGNOSIS — Z6832 Body mass index (BMI) 32.0-32.9, adult: Secondary | ICD-10-CM | POA: Diagnosis not present

## 2021-03-24 DIAGNOSIS — I714 Abdominal aortic aneurysm, without rupture: Secondary | ICD-10-CM | POA: Diagnosis not present

## 2021-04-08 DIAGNOSIS — E7849 Other hyperlipidemia: Secondary | ICD-10-CM | POA: Diagnosis not present

## 2021-04-08 DIAGNOSIS — E559 Vitamin D deficiency, unspecified: Secondary | ICD-10-CM | POA: Diagnosis not present

## 2021-04-08 DIAGNOSIS — K219 Gastro-esophageal reflux disease without esophagitis: Secondary | ICD-10-CM | POA: Diagnosis not present

## 2021-04-26 DIAGNOSIS — L814 Other melanin hyperpigmentation: Secondary | ICD-10-CM | POA: Diagnosis not present

## 2021-04-26 DIAGNOSIS — Z1283 Encounter for screening for malignant neoplasm of skin: Secondary | ICD-10-CM | POA: Diagnosis not present

## 2021-04-26 DIAGNOSIS — D239 Other benign neoplasm of skin, unspecified: Secondary | ICD-10-CM | POA: Diagnosis not present

## 2021-04-26 DIAGNOSIS — L570 Actinic keratosis: Secondary | ICD-10-CM | POA: Diagnosis not present

## 2021-05-10 ENCOUNTER — Other Ambulatory Visit: Payer: Self-pay

## 2021-05-10 ENCOUNTER — Ambulatory Visit (INDEPENDENT_AMBULATORY_CARE_PROVIDER_SITE_OTHER): Payer: Medicare Other | Admitting: Orthopedic Surgery

## 2021-05-10 VITALS — BP 148/81 | HR 62 | Ht 72.0 in | Wt 221.1 lb

## 2021-05-10 DIAGNOSIS — R911 Solitary pulmonary nodule: Secondary | ICD-10-CM | POA: Diagnosis not present

## 2021-05-10 DIAGNOSIS — Z1331 Encounter for screening for depression: Secondary | ICD-10-CM | POA: Diagnosis not present

## 2021-05-10 DIAGNOSIS — Z125 Encounter for screening for malignant neoplasm of prostate: Secondary | ICD-10-CM | POA: Diagnosis not present

## 2021-05-10 DIAGNOSIS — G8929 Other chronic pain: Secondary | ICD-10-CM | POA: Diagnosis not present

## 2021-05-10 DIAGNOSIS — M1611 Unilateral primary osteoarthritis, right hip: Secondary | ICD-10-CM | POA: Diagnosis not present

## 2021-05-10 DIAGNOSIS — E7800 Pure hypercholesterolemia, unspecified: Secondary | ICD-10-CM | POA: Diagnosis not present

## 2021-05-10 DIAGNOSIS — M545 Low back pain, unspecified: Secondary | ICD-10-CM

## 2021-05-10 DIAGNOSIS — Z0000 Encounter for general adult medical examination without abnormal findings: Secondary | ICD-10-CM | POA: Diagnosis not present

## 2021-05-10 DIAGNOSIS — Z789 Other specified health status: Secondary | ICD-10-CM | POA: Diagnosis not present

## 2021-05-10 DIAGNOSIS — Z7189 Other specified counseling: Secondary | ICD-10-CM | POA: Diagnosis not present

## 2021-05-10 DIAGNOSIS — M1610 Unilateral primary osteoarthritis, unspecified hip: Secondary | ICD-10-CM

## 2021-05-10 DIAGNOSIS — Z79899 Other long term (current) drug therapy: Secondary | ICD-10-CM | POA: Diagnosis not present

## 2021-05-10 DIAGNOSIS — E559 Vitamin D deficiency, unspecified: Secondary | ICD-10-CM | POA: Diagnosis not present

## 2021-05-10 DIAGNOSIS — Z1339 Encounter for screening examination for other mental health and behavioral disorders: Secondary | ICD-10-CM | POA: Diagnosis not present

## 2021-05-10 DIAGNOSIS — Z6831 Body mass index (BMI) 31.0-31.9, adult: Secondary | ICD-10-CM | POA: Diagnosis not present

## 2021-05-10 DIAGNOSIS — E538 Deficiency of other specified B group vitamins: Secondary | ICD-10-CM | POA: Diagnosis not present

## 2021-05-10 DIAGNOSIS — F17210 Nicotine dependence, cigarettes, uncomplicated: Secondary | ICD-10-CM | POA: Diagnosis not present

## 2021-05-10 DIAGNOSIS — R5383 Other fatigue: Secondary | ICD-10-CM | POA: Diagnosis not present

## 2021-05-10 DIAGNOSIS — Z299 Encounter for prophylactic measures, unspecified: Secondary | ICD-10-CM | POA: Diagnosis not present

## 2021-05-10 NOTE — Progress Notes (Signed)
Established patient new problem   Chief Complaint  Patient presents with   Hip Pain    R/hurting for several months, had xray at [facility].     HPI Leon Mitchell had an ablation procedure in his lumbar spine but that was a few months ago comes in with right lower back buttock pain does not radiate denies groin or anterior thigh pain.  He says that the person who did his back operation thinks it may be his hip so he went ahead and had an hip x-ray at [facility]  Body mass index is 29.99 kg/m.  BP (!) 148/81   Pulse 62   Ht 6' (1.829 m)   Wt 221 lb 2 oz (100.3 kg)   BMI 29.99 kg/m   Past Medical History:  Diagnosis Date   Bulging lumbar disc    GERD (gastroesophageal reflux disease)    Hypertension    Kidney stones     Physical Exam  Constitutional: Development normal, nutrition normal, body habitus normal  Body mass index is 29.99 kg/m.  Mental status oriented x3 mood and affect no depression Cardiovascular pulses and temperature normal   Gait mildly abnormal  Musculoskeletal:   Inspection: Right hip is pain-free in terms of tenderness Exparel compartments Range of motion: Decreased internal rotation but no pain with internal and external rotation or hip flexion this is repeated on the left as well Stability: Normal Muscle tone is normal  Skin warm dry and intact no erythema  Neurological sensation normal  Assessment and plan:  Encounter Diagnoses  Name Primary?   Arthritis pain, hip Yes   Chronic right-sided low back pain without sciatica      The x-ray shows mild arthritis of the hip I could not prove reproduce the pain he is having in his lower back and buttock area by moving his hip  I doubt this is related to his hip more likely related to his chronic back pain

## 2021-07-16 DIAGNOSIS — Z20828 Contact with and (suspected) exposure to other viral communicable diseases: Secondary | ICD-10-CM | POA: Diagnosis not present

## 2021-08-03 ENCOUNTER — Other Ambulatory Visit: Payer: Self-pay | Admitting: *Deleted

## 2021-08-03 DIAGNOSIS — Z87891 Personal history of nicotine dependence: Secondary | ICD-10-CM

## 2021-08-03 DIAGNOSIS — F17210 Nicotine dependence, cigarettes, uncomplicated: Secondary | ICD-10-CM

## 2021-08-06 DIAGNOSIS — R42 Dizziness and giddiness: Secondary | ICD-10-CM | POA: Diagnosis not present

## 2021-08-06 DIAGNOSIS — Z299 Encounter for prophylactic measures, unspecified: Secondary | ICD-10-CM | POA: Diagnosis not present

## 2021-08-06 DIAGNOSIS — F17210 Nicotine dependence, cigarettes, uncomplicated: Secondary | ICD-10-CM | POA: Diagnosis not present

## 2021-08-06 DIAGNOSIS — M25551 Pain in right hip: Secondary | ICD-10-CM | POA: Diagnosis not present

## 2021-08-06 DIAGNOSIS — Z2821 Immunization not carried out because of patient refusal: Secondary | ICD-10-CM | POA: Diagnosis not present

## 2021-08-06 DIAGNOSIS — I700 Atherosclerosis of aorta: Secondary | ICD-10-CM | POA: Diagnosis not present

## 2021-08-06 DIAGNOSIS — I10 Essential (primary) hypertension: Secondary | ICD-10-CM | POA: Diagnosis not present

## 2021-08-09 DIAGNOSIS — R42 Dizziness and giddiness: Secondary | ICD-10-CM | POA: Diagnosis not present

## 2021-08-09 DIAGNOSIS — R300 Dysuria: Secondary | ICD-10-CM | POA: Diagnosis not present

## 2021-08-16 DIAGNOSIS — Z20828 Contact with and (suspected) exposure to other viral communicable diseases: Secondary | ICD-10-CM | POA: Diagnosis not present

## 2021-08-20 DIAGNOSIS — Z6831 Body mass index (BMI) 31.0-31.9, adult: Secondary | ICD-10-CM | POA: Diagnosis not present

## 2021-08-20 DIAGNOSIS — M25551 Pain in right hip: Secondary | ICD-10-CM | POA: Diagnosis not present

## 2021-08-20 DIAGNOSIS — Z299 Encounter for prophylactic measures, unspecified: Secondary | ICD-10-CM | POA: Diagnosis not present

## 2021-08-20 DIAGNOSIS — I10 Essential (primary) hypertension: Secondary | ICD-10-CM | POA: Diagnosis not present

## 2021-08-20 DIAGNOSIS — I779 Disorder of arteries and arterioles, unspecified: Secondary | ICD-10-CM | POA: Diagnosis not present

## 2021-08-23 DIAGNOSIS — I6529 Occlusion and stenosis of unspecified carotid artery: Secondary | ICD-10-CM | POA: Diagnosis not present

## 2021-09-06 ENCOUNTER — Encounter: Payer: Self-pay | Admitting: Acute Care

## 2021-09-06 ENCOUNTER — Other Ambulatory Visit: Payer: Self-pay

## 2021-09-06 ENCOUNTER — Telehealth (INDEPENDENT_AMBULATORY_CARE_PROVIDER_SITE_OTHER): Payer: Medicare Other | Admitting: Acute Care

## 2021-09-06 DIAGNOSIS — F17210 Nicotine dependence, cigarettes, uncomplicated: Secondary | ICD-10-CM

## 2021-09-06 NOTE — Patient Instructions (Signed)
Thank you for participating in the [facility]. °It was our pleasure to meet you today. °We will call you with the results of your scan within the next few days. °Your scan will be assigned a Lung RADS category score by the physicians reading the scans.  °This Lung RADS score determines follow up scanning.  °See below for description of categories, and follow up screening recommendations. °We will be in touch to schedule your follow up screening annually or based on recommendations of our providers. °We will fax a copy of your scan results to your Primary Care Physician, or the physician who referred you to the program, to ensure they have the results. °Please call the office if you have any questions or concerns regarding your scanning experience or results.  °Our office number is [phone]. °Please speak with Denise Phelps, RN. She is our Lung Cancer Screening RN. °If she is unavailable when you call, please have the office staff send her a message. She will return your call at her earliest convenience. °Remember, if your scan is normal, we will scan you annually as long as you continue to meet the criteria for the program. (Age 55-77, Current smoker or smoker who has quit within the last 15 years). °If you are a smoker, remember, quitting is the single most powerful action that you can take to decrease your risk of lung cancer and other pulmonary, breathing related problems. °We know quitting is hard, and we are here to help.  °Please let us know if there is anything we can do to help you meet your goal of quitting. °If you are a former smoker, congratulations. We are proud of you! Remain smoke free! °Remember you can refer friends or family members through the number above.  °We will screen them to make sure they meet criteria for the program. °Thank you for helping us take better care of you by participating in Lung Screening. ° °You can receive free nicotine replacement therapy  ( patches, gum or mints) by calling 1-800-QUIT NOW. Please call so we can get you on the path to becoming  a non-smoker. I know it is hard, but you can do this! ° °Lung RADS Categories: ° °Lung RADS 1: no nodules or definitely non-concerning nodules.  °Recommendation is for a repeat annual scan in 12 months. ° °Lung RADS 2:  nodules that are non-concerning in appearance and behavior with a very low likelihood of becoming an active cancer. °Recommendation is for a repeat annual scan in 12 months. ° °Lung RADS 3: nodules that are probably non-concerning , includes nodules with a low likelihood of becoming an active cancer.  Recommendation is for a 6-month repeat screening scan. Often noted after an upper respiratory illness. We will be in touch to make sure you have no questions, and to schedule your 6-month scan. ° °Lung RADS 4 A: nodules with concerning findings, recommendation is most often for a follow up scan in 3 months or additional testing based on our provider's assessment of the scan. We will be in touch to make sure you have no questions and to schedule the recommended 3 month follow up scan. ° °Lung RADS 4 B:  indicates findings that are concerning. We will be in touch with you to schedule additional diagnostic testing based on our provider's  assessment of the scan. ° °Hypnosis for smoking cessation  °[employer] °[phone] ° °Acupuncture for smoking cessation  °[facility] °[phone]  °

## 2021-09-06 NOTE — Progress Notes (Signed)
Virtual Visit via Video Note  I connected with Leon Mitchell on 09/06/21 at  2:00 PM EST by a video enabled telemedicine application and verified that I am speaking with the correct person using two identifiers.  Location: Patient: At home Provider:  [place], [place], [place], [address]    I discussed the limitations of evaluation and management by telemedicine and the availability of in person appointments. The patient expressed understanding and agreed to proceed.  Shared Decision Making Visit Lung Cancer Screening Program [phone])   Eligibility: Age 65 y.o. Pack Years Smoking History Calculation 48 pack year smoking history (# packs/per year x # years smoked) Recent History of coughing up blood  no Unexplained weight loss? no ( >Than 15 pounds within the last 6 months ) Prior History Lung / other cancer no (Diagnosis within the last 5 years already requiring surveillance chest CT Scans). Smoking Status Current Smoker Former Smokers: Years since quit:  NA  Quit Date: NA  Visit Components: Discussion included one or more decision making aids. yes Discussion included risk/benefits of screening. yes Discussion included potential follow up diagnostic testing for abnormal scans. yes Discussion included meaning and risk of over diagnosis. yes Discussion included meaning and risk of False Positives. yes Discussion included meaning of total radiation exposure. yes  Counseling Included: Importance of adherence to annual lung cancer LDCT screening. yes Impact of comorbidities on ability to participate in the program. yes Ability and willingness to under diagnostic treatment. yes  Smoking Cessation Counseling: Current Smokers:  Discussed importance of smoking cessation. yes Information about tobacco cessation classes and interventions provided to patient. yes Patient provided with "ticket" for LDCT Scan. yes Symptomatic Patient. no  Counseling NA Diagnosis Code:  Tobacco Use Z72.0 Asymptomatic Patient yes  Counseling (Intermediate counseling: > three minutes counseling) S8546 Former Smokers:  Discussed the importance of maintaining cigarette abstinence. yes Diagnosis Code: Personal History of Nicotine Dependence. E70.350 Information about tobacco cessation classes and interventions provided to patient. Yes Patient provided with "ticket" for LDCT Scan. yes Written Order for Lung Cancer Screening with LDCT placed in Epic. Yes (CT Chest Lung Cancer Screening Low Dose W/O CM) [id number] Z12.2-Screening of respiratory organs Z87.891-Personal history of nicotine dependence  I have spent 25 minutes of face to face time with Leon Mitchell discussing the risks and benefits of lung cancer screening. We viewed a power point together that explained in detail the above noted topics. We paused at intervals to allow for questions to be asked and answered to ensure understanding.We discussed that the single most powerful action that he can take to decrease his risk of developing lung cancer is to quit smoking. We discussed whether or not he is ready to commit to setting a quit date. We discussed options for tools to aid in quitting smoking including nicotine replacement therapy, non-nicotine medications, support groups, Quit Smart classes, and behavior modification. We discussed that often times setting smaller, more achievable goals, such as eliminating 1 cigarette a day for a week and then 2 cigarettes a day for a week can be helpful in slowly decreasing the number of cigarettes smoked. This allows for a sense of accomplishment as well as providing a clinical benefit. I gave him the " Be Stronger Than Your Excuses" card with contact information for community resources, classes, free nicotine replacement therapy, and access to mobile apps, text messaging, and on-line smoking cessation help. I have also given him my card and contact information in the event he needs  to contact me. We  discussed the time and location of the scan, and that either Leon Glassman RN or I will call with the results within 24-48 hours of receiving them. I have offered him  a copy of the power point we viewed  as a resource in the event they need reinforcement of the concepts we discussed today in the office. The patient verbalized understanding of all of  the above and had no further questions upon leaving the office. They have my contact information in the event they have any further questions.  I spent 3 minutes counseling on smoking cessation and the health risks of continued tobacco abuse.  I explained to the patient that there has been a high incidence of coronary artery disease noted on these exams. I explained that this is a non-gated exam therefore degree or severity cannot be determined. This patient is not on statin therapy. I have asked the patient to follow-up with their PCP regarding any incidental finding of coronary artery disease and management with diet or medication as their PCP  feels is clinically indicated. The patient verbalized understanding of the above and had no further questions upon completion of the visit.      Magdalen Spatz, NP 09/06/2021

## 2021-09-07 ENCOUNTER — Ambulatory Visit (HOSPITAL_COMMUNITY): Admission: RE | Admit: 2021-09-07 | Payer: Medicare Other | Source: Ambulatory Visit

## 2021-09-08 ENCOUNTER — Ambulatory Visit (INDEPENDENT_AMBULATORY_CARE_PROVIDER_SITE_OTHER): Payer: Medicare Other | Admitting: Vascular Surgery

## 2021-09-08 ENCOUNTER — Other Ambulatory Visit: Payer: Self-pay

## 2021-09-08 ENCOUNTER — Encounter: Payer: Self-pay | Admitting: Vascular Surgery

## 2021-09-08 VITALS — BP 152/87 | HR 54 | Temp 98.2°F | Resp 20 | Ht 72.0 in | Wt 222.0 lb

## 2021-09-08 DIAGNOSIS — I6523 Occlusion and stenosis of bilateral carotid arteries: Secondary | ICD-10-CM

## 2021-09-08 DIAGNOSIS — R42 Dizziness and giddiness: Secondary | ICD-10-CM | POA: Diagnosis not present

## 2021-09-08 DIAGNOSIS — R300 Dysuria: Secondary | ICD-10-CM | POA: Diagnosis not present

## 2021-09-08 NOTE — Progress Notes (Signed)
Patient ID: Leon Mitchell, male   DOB: August 25, 1956, 65 y.o.   MRN: [id number]  Reason for Consult: New Patient (Initial Visit)   Referred by Glenda Chroman, MD  Subjective:     HPI:  Leon Mitchell is a 65 y.o. male without previous vascular disease.  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  He denies any history of stroke, TIA or amaurosis.  He has no personal or family history of aneurysm disease and he walks without limitation.  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  His father actually had an intracerebral aneurysm from which she died at the age of 2 but no abdominal aortic aneurysms.  He recently underwent [facility] screening which demonstrated carotid stenosis.  He is now here for further evaluation.  Past Medical History:  Diagnosis Date   Bulging lumbar disc    GERD (gastroesophageal reflux disease)    Hypertension    Kidney stones    Family History  Problem Relation Age of Onset   Hypertension Mother    Hypertension Father    Hypertension Sister    Hypertension Brother    Past Surgical History:  Procedure Laterality Date   APPENDECTOMY     BACK SURGERY  01/12/16   T 5&6   BACK SURGERY  04/2016   T 6-9   COLONOSCOPY N/A 01/06/2016   Procedure: COLONOSCOPY;  Surgeon: Rogene Houston, MD;  [facility];  Service: Endoscopy;  Laterality: N/A;  730   HAND SURGERY     KNEE ARTHROSCOPY Left 2006    Short Social History:  Social History   Tobacco Use   Smoking status: Every Day    Packs/day: 1.00    Years: 40.00    Pack years: 40.00    Types: Cigarettes   Smokeless tobacco: Never  Substance Use Topics   Alcohol use: Yes    Comment: very little; occasionally    No Known Allergies  Current Outpatient Medications  Medication Sig Dispense Refill   diclofenac (VOLTAREN) 75 MG EC tablet Take by mouth.     gabapentin (NEURONTIN) 300 MG capsule Take 300 mg by mouth 3 (three) times daily.      lisinopril (PRINIVIL,ZESTRIL) 10 MG tablet Take 10 mg by mouth daily.  3   omeprazole (PRILOSEC) 40 MG capsule      Vitamin D, Ergocalciferol, (DRISDOL) 1.25 MG (50000 UNIT) CAPS capsule every 7 (seven) days.      No current facility-administered medications for this visit.   Facility-Administered Medications Ordered in Other Visits  Medication Dose Route Frequency Provider Last Rate Last Admin   gadopentetate dimeglumine (MAGNEVIST) injection 20 mL  20 mL Intravenous Once PRN Penumalli, Earlean Polka, MD        Review of Systems  Constitutional:  Constitutional negative. HENT: HENT negative.  Eyes: Eyes negative.  Respiratory: Respiratory negative.  Cardiovascular: Cardiovascular negative.  GI: Gastrointestinal negative.  Skin: Skin negative.  Neurological: Positive for focal weakness and numbness.  Hematologic: Hematologic/lymphatic negative.  Psychiatric: Psychiatric negative.       Objective:  Objective   Vitals:   09/08/21 1035 09/08/21 1037  BP: 140/84 (!) 152/87  Pulse: (!) 54   Resp: 20   Temp: 98.2 F (36.8 C)   SpO2: 98%   Weight: 222 lb (100.7 kg)   Height: 6' (1.829 m)    Body mass index is 30.11 kg/m.  Physical Exam HENT:  Head: Normocephalic.     Nose:     Comments: Wearing a mask Eyes:     Pupils: Pupils are equal, round, and reactive to light.  Neck:     Vascular: No carotid bruit.  Cardiovascular:     Rate and Rhythm: Normal rate.     Pulses: Normal pulses.     Heart sounds: Normal heart sounds.  Pulmonary:     Effort: Pulmonary effort is normal.     Breath sounds: Normal breath sounds.  Abdominal:     General: Abdomen is flat.     Palpations: Abdomen is soft.  Musculoskeletal:        General: Normal range of motion.     Right lower leg: No edema.     Left lower leg: No edema.  Skin:    Capillary Refill: Capillary refill takes less than 2 seconds.  Neurological:     General: No focal deficit present.     Mental Status: He is alert.   Psychiatric:        Mood and Affect: Mood normal.        Behavior: Behavior normal.    Data: I reviewed his carotid duplex report which demonstrates peak systolic velocity on the right 51 cm read as less than 50% stenosis and on the left side peak systolic velocity 852 cm/s interpreted as 50 to 69% stenosis.  There is no reporting of end-diastolic velocity.     Assessment/Plan:     65 year old male with moderate left-sided carotid stenosis by recent screening test.  He does take Crestor does not take any antiplatelet agents.  I have recommended 81 mg aspirin daily and to continue his Crestor and also have discussed smoking cessation.  We will get him to follow-up in 6 months with formal carotid duplex.  I discussed the signs and symptoms of stroke for which to seek emergent medical evaluation patient demonstrates good understanding.  Short of this we will see him in 6 months     Waynetta Sandy MD [facility]

## 2021-09-09 ENCOUNTER — Other Ambulatory Visit: Payer: Self-pay

## 2021-09-09 DIAGNOSIS — I6523 Occlusion and stenosis of bilateral carotid arteries: Secondary | ICD-10-CM

## 2021-09-15 DIAGNOSIS — Z20828 Contact with and (suspected) exposure to other viral communicable diseases: Secondary | ICD-10-CM | POA: Diagnosis not present

## 2021-09-21 ENCOUNTER — Ambulatory Visit (HOSPITAL_COMMUNITY)
Admission: RE | Admit: 2021-09-21 | Discharge: 2021-09-21 | Disposition: A | Discharge status: Home / Self Care | Payer: Medicare Other | Source: Ambulatory Visit | Attending: Acute Care | Admitting: Acute Care

## 2021-09-21 ENCOUNTER — Other Ambulatory Visit: Payer: Self-pay

## 2021-09-21 DIAGNOSIS — Z713 Dietary counseling and surveillance: Secondary | ICD-10-CM | POA: Diagnosis not present

## 2021-09-21 DIAGNOSIS — Z87891 Personal history of nicotine dependence: Secondary | ICD-10-CM | POA: Insufficient documentation

## 2021-09-21 DIAGNOSIS — F17210 Nicotine dependence, cigarettes, uncomplicated: Secondary | ICD-10-CM | POA: Insufficient documentation

## 2021-09-21 DIAGNOSIS — Z6831 Body mass index (BMI) 31.0-31.9, adult: Secondary | ICD-10-CM | POA: Diagnosis not present

## 2021-09-21 DIAGNOSIS — Z299 Encounter for prophylactic measures, unspecified: Secondary | ICD-10-CM | POA: Diagnosis not present

## 2021-09-21 DIAGNOSIS — M25551 Pain in right hip: Secondary | ICD-10-CM | POA: Diagnosis not present

## 2021-09-21 DIAGNOSIS — I10 Essential (primary) hypertension: Secondary | ICD-10-CM | POA: Diagnosis not present

## 2021-09-29 ENCOUNTER — Other Ambulatory Visit: Payer: Self-pay | Admitting: Acute Care

## 2021-09-29 DIAGNOSIS — F17210 Nicotine dependence, cigarettes, uncomplicated: Secondary | ICD-10-CM

## 2021-09-29 DIAGNOSIS — F17200 Nicotine dependence, unspecified, uncomplicated: Secondary | ICD-10-CM

## 2021-09-29 DIAGNOSIS — Z87891 Personal history of nicotine dependence: Secondary | ICD-10-CM

## 2021-10-15 ENCOUNTER — Other Ambulatory Visit (HOSPITAL_COMMUNITY): Payer: Self-pay | Admitting: Orthopedic Surgery

## 2021-10-15 ENCOUNTER — Other Ambulatory Visit: Payer: Self-pay | Admitting: Orthopedic Surgery

## 2021-10-15 DIAGNOSIS — Z20828 Contact with and (suspected) exposure to other viral communicable diseases: Secondary | ICD-10-CM | POA: Diagnosis not present

## 2021-10-15 DIAGNOSIS — M8700 Idiopathic aseptic necrosis of unspecified bone: Secondary | ICD-10-CM

## 2021-10-15 DIAGNOSIS — M25551 Pain in right hip: Secondary | ICD-10-CM | POA: Diagnosis not present

## 2021-10-15 DIAGNOSIS — M545 Low back pain, unspecified: Secondary | ICD-10-CM | POA: Diagnosis not present

## 2021-10-27 ENCOUNTER — Other Ambulatory Visit: Payer: Self-pay

## 2021-10-27 ENCOUNTER — Ambulatory Visit (HOSPITAL_COMMUNITY)
Admission: RE | Admit: 2021-10-27 | Discharge: 2021-10-27 | Disposition: A | Discharge status: Home / Self Care | Payer: Medicare Other | Source: Ambulatory Visit | Attending: Orthopedic Surgery | Admitting: Orthopedic Surgery

## 2021-10-27 DIAGNOSIS — S76311A Strain of muscle, fascia and tendon of the posterior muscle group at thigh level, right thigh, initial encounter: Secondary | ICD-10-CM | POA: Diagnosis not present

## 2021-10-27 DIAGNOSIS — M8700 Idiopathic aseptic necrosis of unspecified bone: Secondary | ICD-10-CM | POA: Diagnosis not present

## 2021-10-27 DIAGNOSIS — M1611 Unilateral primary osteoarthritis, right hip: Secondary | ICD-10-CM | POA: Diagnosis not present

## 2021-10-27 DIAGNOSIS — M25751 Osteophyte, right hip: Secondary | ICD-10-CM | POA: Diagnosis not present

## 2021-10-29 DIAGNOSIS — M25551 Pain in right hip: Secondary | ICD-10-CM | POA: Diagnosis not present

## 2021-11-02 DIAGNOSIS — M25551 Pain in right hip: Secondary | ICD-10-CM | POA: Diagnosis not present

## 2021-11-09 DIAGNOSIS — M79674 Pain in right toe(s): Secondary | ICD-10-CM | POA: Diagnosis not present

## 2021-11-09 DIAGNOSIS — M1990 Unspecified osteoarthritis, unspecified site: Secondary | ICD-10-CM | POA: Diagnosis not present

## 2021-11-09 DIAGNOSIS — M79672 Pain in left foot: Secondary | ICD-10-CM | POA: Diagnosis not present

## 2021-11-09 DIAGNOSIS — S93332A Other subluxation of left foot, initial encounter: Secondary | ICD-10-CM | POA: Diagnosis not present

## 2021-11-09 DIAGNOSIS — M79675 Pain in left toe(s): Secondary | ICD-10-CM | POA: Diagnosis not present

## 2021-11-09 DIAGNOSIS — R300 Dysuria: Secondary | ICD-10-CM | POA: Diagnosis not present

## 2021-11-09 DIAGNOSIS — M779 Enthesopathy, unspecified: Secondary | ICD-10-CM | POA: Diagnosis not present

## 2021-11-09 DIAGNOSIS — S93331A Other subluxation of right foot, initial encounter: Secondary | ICD-10-CM | POA: Diagnosis not present

## 2021-11-09 DIAGNOSIS — M79671 Pain in right foot: Secondary | ICD-10-CM | POA: Diagnosis not present

## 2021-11-09 DIAGNOSIS — L110 Acquired keratosis follicularis: Secondary | ICD-10-CM | POA: Diagnosis not present

## 2021-11-09 DIAGNOSIS — R42 Dizziness and giddiness: Secondary | ICD-10-CM | POA: Diagnosis not present

## 2021-11-17 DIAGNOSIS — M25551 Pain in right hip: Secondary | ICD-10-CM | POA: Diagnosis not present

## 2021-11-19 DIAGNOSIS — M545 Low back pain, unspecified: Secondary | ICD-10-CM | POA: Diagnosis not present

## 2021-11-26 DIAGNOSIS — M5416 Radiculopathy, lumbar region: Secondary | ICD-10-CM | POA: Diagnosis not present

## 2021-11-30 DIAGNOSIS — M5416 Radiculopathy, lumbar region: Secondary | ICD-10-CM | POA: Diagnosis not present

## 2021-12-08 DIAGNOSIS — L570 Actinic keratosis: Secondary | ICD-10-CM | POA: Diagnosis not present

## 2021-12-08 DIAGNOSIS — D239 Other benign neoplasm of skin, unspecified: Secondary | ICD-10-CM | POA: Diagnosis not present

## 2021-12-08 DIAGNOSIS — D485 Neoplasm of uncertain behavior of skin: Secondary | ICD-10-CM | POA: Diagnosis not present

## 2021-12-08 DIAGNOSIS — Z1283 Encounter for screening for malignant neoplasm of skin: Secondary | ICD-10-CM | POA: Diagnosis not present

## 2021-12-08 DIAGNOSIS — L821 Other seborrheic keratosis: Secondary | ICD-10-CM | POA: Diagnosis not present

## 2021-12-09 DIAGNOSIS — H35033 Hypertensive retinopathy, bilateral: Secondary | ICD-10-CM | POA: Diagnosis not present

## 2021-12-17 DIAGNOSIS — M5416 Radiculopathy, lumbar region: Secondary | ICD-10-CM | POA: Diagnosis not present

## 2021-12-29 DIAGNOSIS — I10 Essential (primary) hypertension: Secondary | ICD-10-CM | POA: Diagnosis not present

## 2021-12-29 DIAGNOSIS — J439 Emphysema, unspecified: Secondary | ICD-10-CM | POA: Diagnosis not present

## 2021-12-29 DIAGNOSIS — Z299 Encounter for prophylactic measures, unspecified: Secondary | ICD-10-CM | POA: Diagnosis not present

## 2021-12-29 DIAGNOSIS — F17210 Nicotine dependence, cigarettes, uncomplicated: Secondary | ICD-10-CM | POA: Diagnosis not present

## 2021-12-29 DIAGNOSIS — J441 Chronic obstructive pulmonary disease with (acute) exacerbation: Secondary | ICD-10-CM | POA: Diagnosis not present

## 2021-12-29 DIAGNOSIS — I779 Disorder of arteries and arterioles, unspecified: Secondary | ICD-10-CM | POA: Diagnosis not present

## 2022-01-14 IMAGING — CT CT CHEST LCS NODULE FOLLOW-UP W/O CM
2 of 5 series · 15 of 40 positions shown, 18 images · non-contrast
Comparison: 04/17/20.

CLINICAL DATA: Current 40 pack year smoker, asymptomatic

EXAM:
CT CHEST WITHOUT CONTRAST FOR LUNG CANCER SCREENING NODULE FOLLOW-UP
TECHNIQUE: Multidetector CT imaging of the chest was performed following the
standard protocol without IV contrast.

[Series 4: lcs f/u 1.00 br60 s3 axial lung · axial · 0.80mm/px · z∈[-1029,-736]mm · 12 of 323 slices shown, 15 images]
[im 15/323  mediastinal]
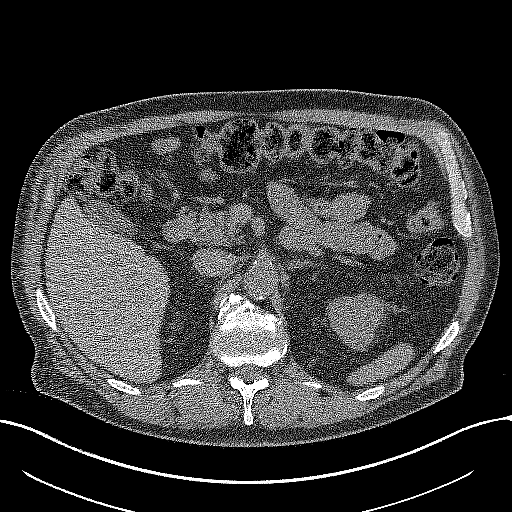
[im 15/323  lung]
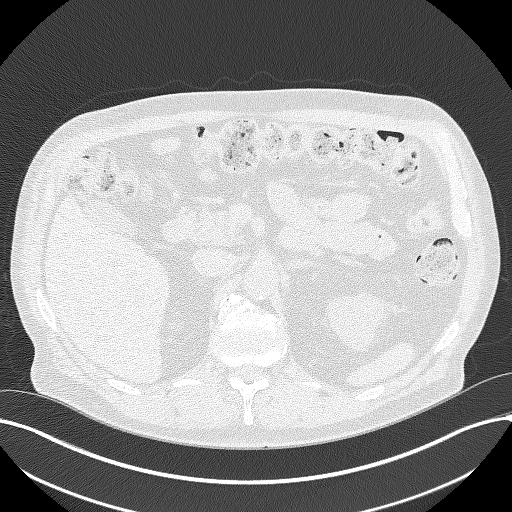
[im 44/323  lung]
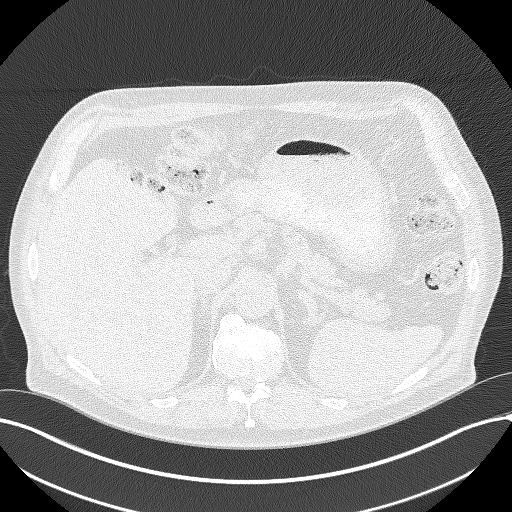
[im 74/323  lung]
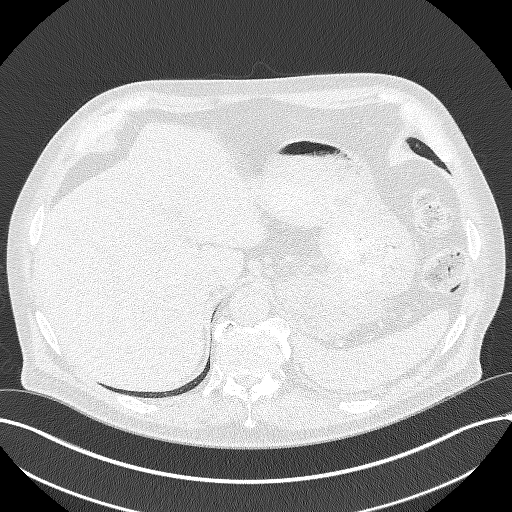
[im 103/323  lung]
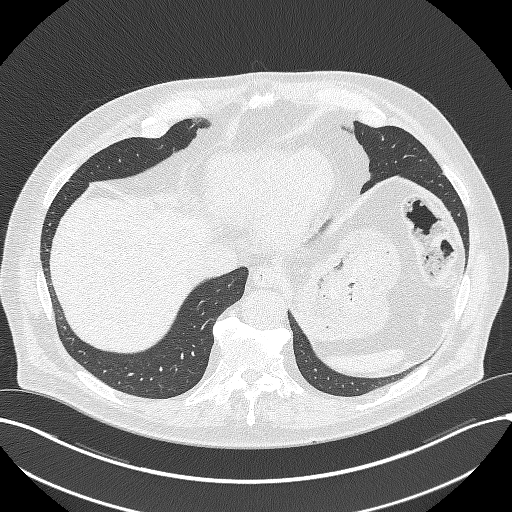
[im 118/323  mediastinal]
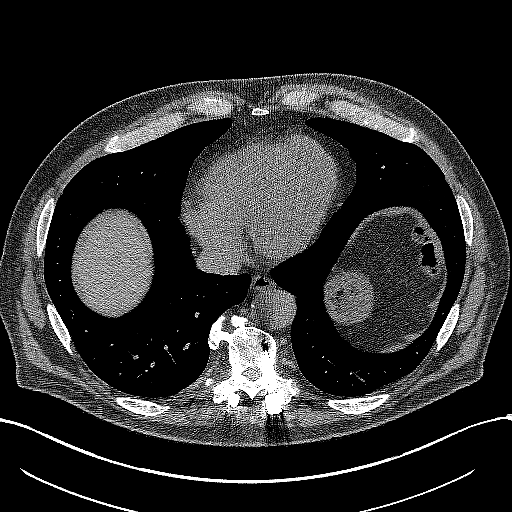
[im 118/323  lung]
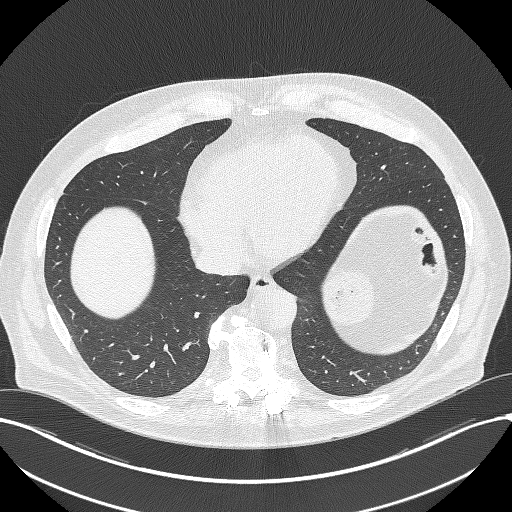
[im 147/323  lung]
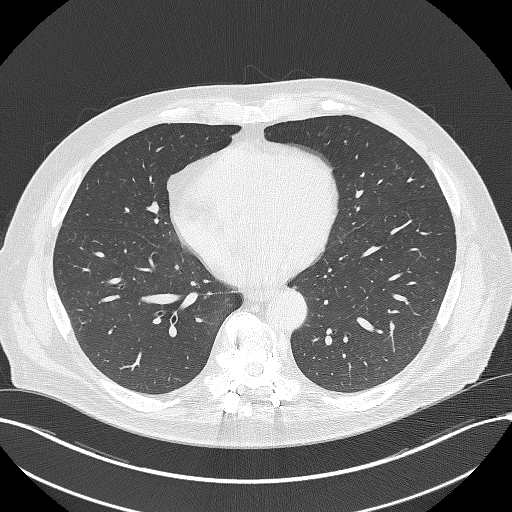
[im 176/323  lung]
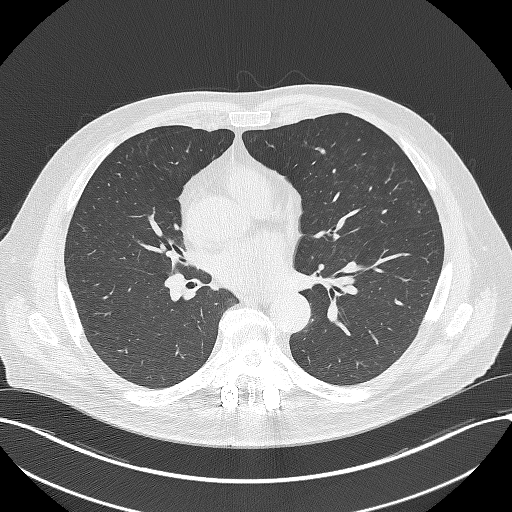
[im 205/323  lung]
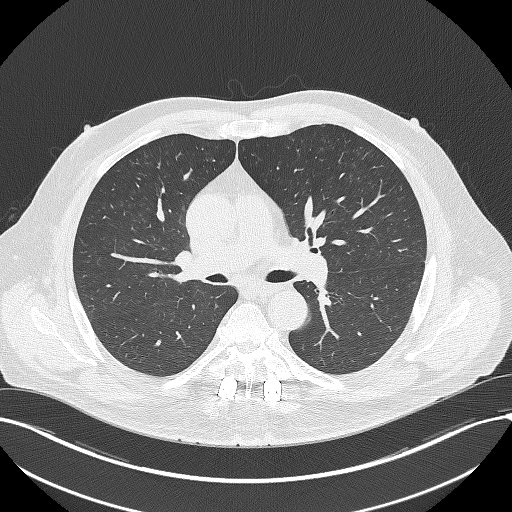
[im 220/323  mediastinal]
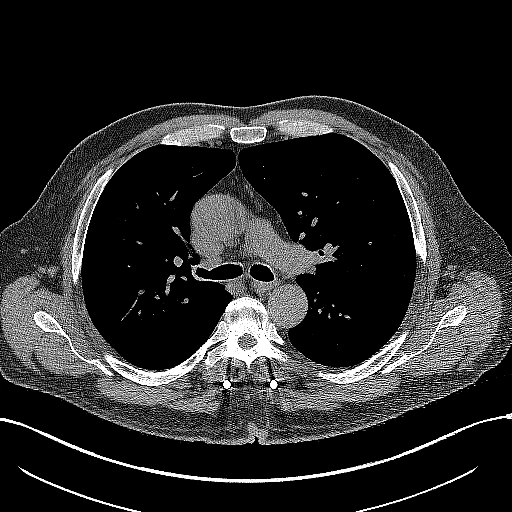
[im 220/323  lung]
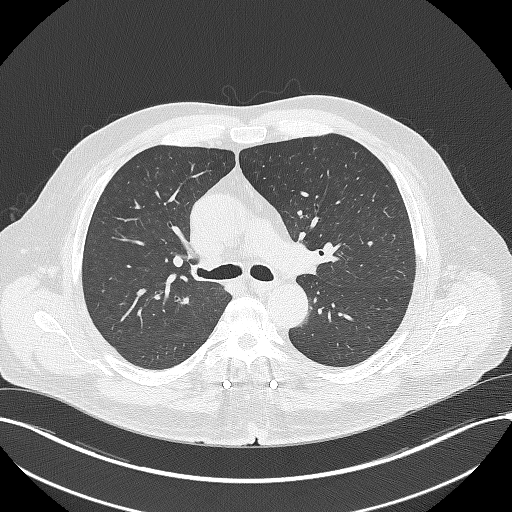
[im 249/323  lung]
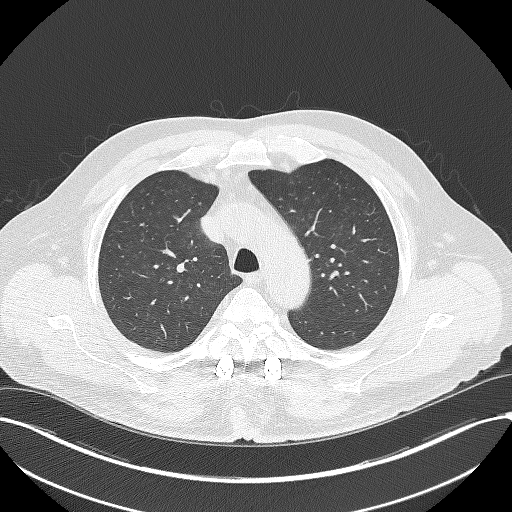
[im 279/323  lung]
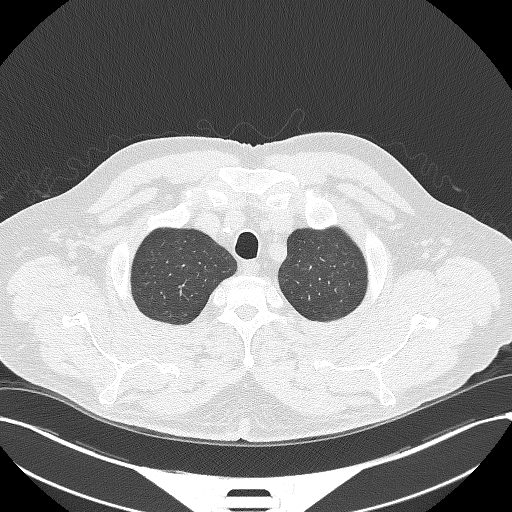
[im 308/323  lung]
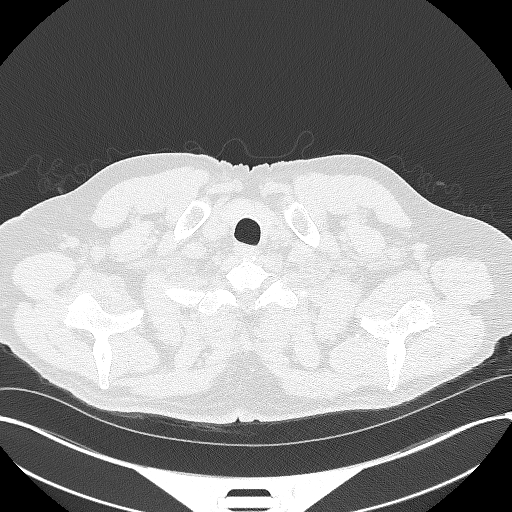

[Series 5: lcs f/u 1.00 br44 s3 cor · coronal · 0.63mm/px · 3 of 366 slices shown]
[im 74/366  lung]
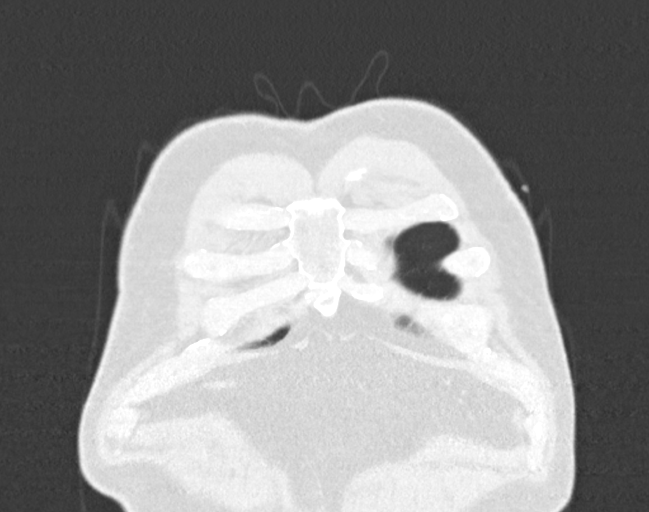
[im 147/366  lung]
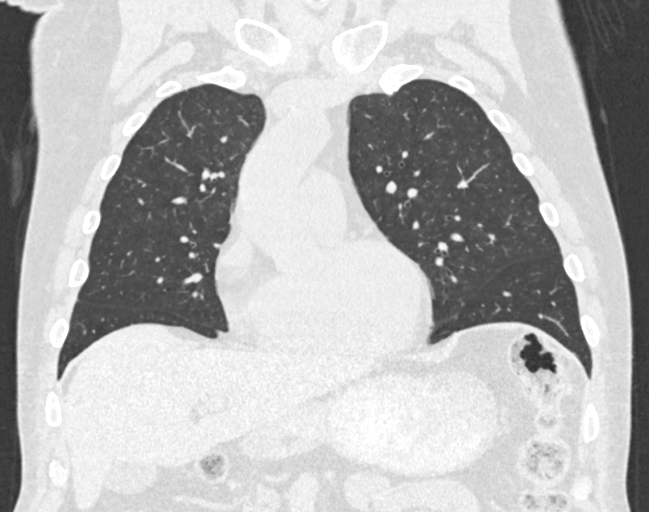
[im 220/366  lung]
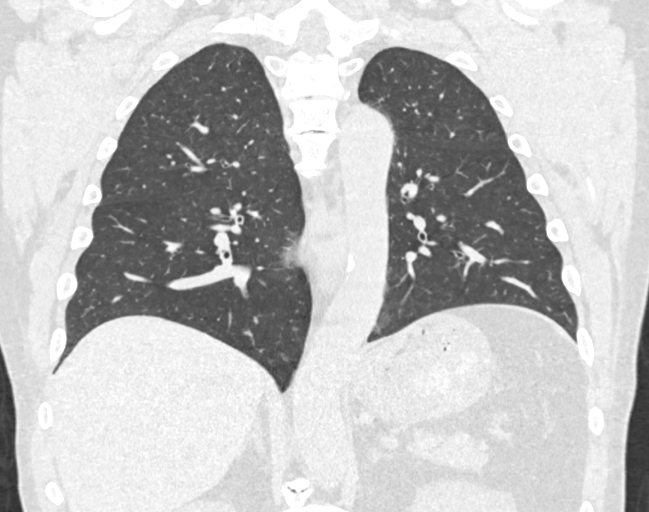

[15 of 40 positions shown; findings below may reference images not displayed]

FINDINGS: Cardiovascular: Heart size is normal. No pericardial effusion
identified. Aortic atherosclerosis. Coronary artery calcifications.

Mediastinum/Nodes: No enlarged mediastinal, hilar, or axillary lymph
nodes. Thyroid gland, trachea, and esophagus demonstrate no
significant findings.

Lungs/Pleura: No pleural effusion, airspace consolidation, or
atelectasis. Unchanged appearance of medial right upper lobe
pulmonary nodule which has an equivalent diameter of 8 mm on today's
study. Calcified granulomas are again noted. No new suspicious lung
nodules.

Upper Abdomen: No acute abnormality.

Musculoskeletal: No chest wall mass or suspicious bone lesions
identified. Thoracic spondylosis identified. The previous posterior
hardware fixation of the thoracic spine.
IMPRESSION: 1. Lung-RADS 2, benign appearance or behavior. Continue annual
screening with low-dose chest CT without contrast in 12 months.
2. Coronary artery calcifications.
3. Aortic atherosclerosis.

Aortic Atherosclerosis (UXW14-M2O.O).

## 2022-01-18 DIAGNOSIS — J441 Chronic obstructive pulmonary disease with (acute) exacerbation: Secondary | ICD-10-CM | POA: Diagnosis not present

## 2022-01-18 DIAGNOSIS — F17210 Nicotine dependence, cigarettes, uncomplicated: Secondary | ICD-10-CM | POA: Diagnosis not present

## 2022-01-18 DIAGNOSIS — Z299 Encounter for prophylactic measures, unspecified: Secondary | ICD-10-CM | POA: Diagnosis not present

## 2022-01-19 DIAGNOSIS — R042 Hemoptysis: Secondary | ICD-10-CM | POA: Diagnosis not present

## 2022-01-19 DIAGNOSIS — R911 Solitary pulmonary nodule: Secondary | ICD-10-CM | POA: Diagnosis not present

## 2022-01-19 DIAGNOSIS — J441 Chronic obstructive pulmonary disease with (acute) exacerbation: Secondary | ICD-10-CM | POA: Diagnosis not present

## 2022-01-19 DIAGNOSIS — F17210 Nicotine dependence, cigarettes, uncomplicated: Secondary | ICD-10-CM | POA: Diagnosis not present

## 2022-01-19 DIAGNOSIS — Z299 Encounter for prophylactic measures, unspecified: Secondary | ICD-10-CM | POA: Diagnosis not present

## 2022-01-19 DIAGNOSIS — J984 Other disorders of lung: Secondary | ICD-10-CM | POA: Diagnosis not present

## 2022-01-19 DIAGNOSIS — J439 Emphysema, unspecified: Secondary | ICD-10-CM | POA: Diagnosis not present

## 2022-01-19 DIAGNOSIS — R059 Cough, unspecified: Secondary | ICD-10-CM | POA: Diagnosis not present

## 2022-01-20 DIAGNOSIS — J984 Other disorders of lung: Secondary | ICD-10-CM | POA: Diagnosis not present

## 2022-01-20 DIAGNOSIS — I700 Atherosclerosis of aorta: Secondary | ICD-10-CM | POA: Diagnosis not present

## 2022-01-20 DIAGNOSIS — I2510 Atherosclerotic heart disease of native coronary artery without angina pectoris: Secondary | ICD-10-CM | POA: Diagnosis not present

## 2022-01-20 DIAGNOSIS — J189 Pneumonia, unspecified organism: Secondary | ICD-10-CM | POA: Diagnosis not present

## 2022-01-20 DIAGNOSIS — R918 Other nonspecific abnormal finding of lung field: Secondary | ICD-10-CM | POA: Diagnosis not present

## 2022-01-21 DIAGNOSIS — Z20828 Contact with and (suspected) exposure to other viral communicable diseases: Secondary | ICD-10-CM | POA: Diagnosis not present

## 2022-02-07 ENCOUNTER — Encounter: Payer: Self-pay | Admitting: Pulmonary Disease

## 2022-02-07 ENCOUNTER — Ambulatory Visit (INDEPENDENT_AMBULATORY_CARE_PROVIDER_SITE_OTHER): Payer: Medicare Other | Admitting: Pulmonary Disease

## 2022-02-07 VITALS — BP 150/90 | HR 52 | Temp 97.7°F | Ht 72.0 in | Wt 205.2 lb

## 2022-02-07 DIAGNOSIS — J984 Other disorders of lung: Secondary | ICD-10-CM

## 2022-02-07 DIAGNOSIS — F17200 Nicotine dependence, unspecified, uncomplicated: Secondary | ICD-10-CM | POA: Diagnosis not present

## 2022-02-07 DIAGNOSIS — Z720 Tobacco use: Secondary | ICD-10-CM | POA: Diagnosis not present

## 2022-02-07 MED ORDER — AMOXICILLIN-POT CLAVULANATE 875-125 MG PO TABS: 1.0000 | ORAL_TABLET | Freq: Two times a day (BID) | ORAL | 0 refills | Status: AC

## 2022-02-07 NOTE — Patient Instructions (Signed)
Thank you for visiting Dr. Valeta Harms at [facility]. ?Today we recommend the following: ? ?Orders Placed This Encounter  ?Procedures  ? CT CHEST WO CONTRAST  ? ?Meds ordered this encounter  ?Medications  ? amoxicillin-clavulanate (AUGMENTIN) 875-125 MG tablet  ?  Sig: Take 1 tablet by mouth 2 (two) times daily.  ?  Dispense:  60 tablet  ?  Refill:  0  ? ?Return in about 4 weeks (around 03/07/2022) for with APP or Dr. Valeta Harms. ? ? ? ?Please do your part to reduce the spread of COVID-19.  ?

## 2022-02-07 NOTE — Progress Notes (Signed)
? ?Synopsis: Referred in May 2023 for cavitary lesion by Glenda Chroman, MD ? ?Subjective:  ? ?PATIENT ID: Leon Mitchell GENDER: male DOB: November 15, 1955, MRN: [id number] ? ?Chief Complaint  ?Patient presents with  ? Consult  ?  Patient is here to talk about nodule.   ? ? ?This is a 66 year old gentleman, past medical history of GERD, hypertension.  Patient was sick a few weeks ago had cough sputum production.  Patient remembers a significant amount of foul tasting sputum production.  Had a CT scan of the chest revealed a new cavity lesion on the right lung.  This was new in comparison to previous CT scan which was a lung cancer screening CT completed in December 2022.  This was relatively large and new in comparison.  He is a longtime smoker.  He smoked for several years and still smokes 1 pack/day.  During this time he did receive 2 courses of antibiotics and after the last visit in April with primary care was treated with antibiotics and steroids.  Both courses were about a week and timeframe. ? ? ?Past Medical History:  ?Diagnosis Date  ? Bulging lumbar disc   ? GERD (gastroesophageal reflux disease)   ? Hypertension   ? Kidney stones   ?  ? ?Family History  ?Problem Relation Age of Onset  ? Hypertension Mother   ? Hypertension Father   ? Hypertension Sister   ? Hypertension Brother   ?  ? ?Past Surgical History:  ?Procedure Laterality Date  ? APPENDECTOMY    ? BACK SURGERY  01/12/16  ? T 5&6  ? BACK SURGERY  04/2016  ? T 6-9  ? COLONOSCOPY N/A 01/06/2016  ? Procedure: COLONOSCOPY;  Surgeon: Rogene Houston, MD;  Location: [facility];  Service: Endoscopy;  Laterality: N/A;  730  ? HAND SURGERY    ? KNEE ARTHROSCOPY Left 2006  ? ? ?Social History  ? ?Socioeconomic History  ? Marital status: Widowed  ?  Spouse name: Not on file  ? Number of children: 2  ? Years of education: 57  ? Highest education level: Not on file  ?Occupational History  ?  Comment: Product manager, disability  ?Tobacco Use  ? Smoking status: Every  Day  ?  Packs/day: 1.00  ?  Years: 40.00  ?  Pack years: 40.00  ?  Types: Cigarettes  ? Smokeless tobacco: Never  ?Vaping Use  ? Vaping Use: Never used  ?Substance and Sexual Activity  ? Alcohol use: Yes  ?  Comment: very little; occasionally  ? Drug use: No  ? Sexual activity: Not on file  ?Other Topics Concern  ? Not on file  ?Social History Narrative  ? Mother lives with patient  ? Caffeine use- 3-4 glasses tea  ? ?Social Determinants of Health  ? ?Financial Resource Strain: Not on file  ?Food Insecurity: Not on file  ?Transportation Needs: Not on file  ?Physical Activity: Not on file  ?Stress: Not on file  ?Social Connections: Not on file  ?Intimate Partner Violence: Not on file  ?  ? ?No Known Allergies  ? ?Outpatient Medications Prior to Visit  ?Medication Sig Dispense Refill  ? diclofenac (VOLTAREN) 75 MG EC tablet Take by mouth.    ? gabapentin (NEURONTIN) 300 MG capsule Take 300 mg by mouth 3 (three) times daily.    ? lisinopril (PRINIVIL,ZESTRIL) 10 MG tablet Take 10 mg by mouth daily.  3  ? omeprazole (PRILOSEC) 40 MG capsule     ?  Vitamin D, Ergocalciferol, (DRISDOL) 1.25 MG (50000 UNIT) CAPS capsule every 7 (seven) days.     ? ?Facility-Administered Medications Prior to Visit  ?Medication Dose Route Frequency Provider Last Rate Last Admin  ? gadopentetate dimeglumine (MAGNEVIST) injection 20 mL  20 mL Intravenous Once PRN Penumalli, Earlean Polka, MD      ? ? ?Review of Systems  ?Constitutional:  Negative for chills, fever, malaise/fatigue and weight loss.  ?HENT:  Negative for hearing loss, sore throat and tinnitus.   ?Eyes:  Negative for blurred vision and double vision.  ?Respiratory:  Positive for cough, sputum production and shortness of breath. Negative for hemoptysis, wheezing and stridor.   ?Cardiovascular:  Negative for chest pain, palpitations, orthopnea, leg swelling and PND.  ?Gastrointestinal:  Negative for abdominal pain, constipation, diarrhea, heartburn, nausea and vomiting.  ?Genitourinary:   Negative for dysuria, hematuria and urgency.  ?Musculoskeletal:  Negative for joint pain and myalgias.  ?Skin:  Negative for itching and rash.  ?Neurological:  Negative for dizziness, tingling, weakness and headaches.  ?Endo/Heme/Allergies:  Negative for environmental allergies. Does not bruise/bleed easily.  ?Psychiatric/Behavioral:  Negative for depression. The patient is not nervous/anxious and does not have insomnia.   ?All other systems reviewed and are negative. ? ? ?Objective:  ?Physical Exam ?Vitals reviewed.  ?Constitutional:   ?   General: He is not in acute distress. ?   Appearance: He is well-developed.  ?HENT:  ?   Head: Normocephalic and atraumatic.  ?Eyes:  ?   General: No scleral icterus. ?   Conjunctiva/sclera: Conjunctivae normal.  ?   Pupils: Pupils are equal, round, and reactive to light.  ?Neck:  ?   Vascular: No JVD.  ?   Trachea: No tracheal deviation.  ?Cardiovascular:  ?   Rate and Rhythm: Normal rate and regular rhythm.  ?   Heart sounds: Normal heart sounds. No murmur heard. ?Pulmonary:  ?   Effort: Pulmonary effort is normal. No tachypnea, accessory muscle usage or respiratory distress.  ?   Breath sounds: No stridor. No wheezing, rhonchi or rales.  ?Abdominal:  ?   General: There is no distension.  ?   Palpations: Abdomen is soft.  ?   Tenderness: There is no abdominal tenderness.  ?Musculoskeletal:     ?   General: No tenderness.  ?   Cervical back: Neck supple.  ?Lymphadenopathy:  ?   Cervical: No cervical adenopathy.  ?Skin: ?   General: Skin is warm and dry.  ?   Capillary Refill: Capillary refill takes less than 2 seconds.  ?   Findings: No rash.  ?Neurological:  ?   Mental Status: He is alert and oriented to person, place, and time.  ?Psychiatric:     ?   Behavior: Behavior normal.  ? ? ? ?Vitals:  ? 02/07/22 1407  ?BP: (!) 150/90  ?Pulse: (!) 52  ?Temp: 97.7 ?F (36.5 ?C)  ?TempSrc: Oral  ?SpO2: 99%  ?Weight: 205 lb 3.2 oz (93.1 kg)  ?Height: 6' (1.829 m)  ? ?99% on RA ?BMI  Readings from Last 3 Encounters:  ?02/07/22 27.83 kg/m?  ?09/08/21 30.11 kg/m?  ?05/10/21 29.99 kg/m?  ? ?Wt Readings from Last 3 Encounters:  ?02/07/22 205 lb 3.2 oz (93.1 kg)  ?09/08/21 222 lb (100.7 kg)  ?05/10/21 221 lb 2 oz (100.3 kg)  ? ? ? ?CBC ?No results found for: WBC, RBC, HGB, HCT, PLT, MCV, MCH, MCHC, RDW, LYMPHSABS, MONOABS, EOSABS, BASOSABS ? ? ?Chest Imaging: ?09/21/2021 lung cancer  screening CT: ?Nothing abnormal in the right chest. ? ?01/20/2022 CT chest with contrast 4.7 cm thick-walled cavitary lesion in the right upper lobe ?The patient's images have been independently reviewed by me.   ? ?Pulmonary Functions Testing Results: ? ?  Latest Ref Rng & Units 08/11/2020  ? 10:17 AM  ?PFT Results  ?FVC-Pre L 4.87    ?FVC-Predicted Pre % 92    ?FVC-Post L 4.91    ?FVC-Predicted Post % 93    ?Pre FEV1/FVC % % 68    ?Post FEV1/FCV % % 71    ?FEV1-Pre L 3.32    ?FEV1-Predicted Pre % 84    ?FEV1-Post L 3.47    ?DLCO uncorrected ml/min/mmHg 26.12    ?DLCO UNC% % 87    ?DLVA Predicted % 90    ?TLC L 7.94    ?TLC % Predicted % 101    ?RV % Predicted % 116    ? ? ?FeNO:  ? ?Pathology:  ? ?Echocardiogram:  ? ?Heart Catheterization:  ?   ?Assessment & Plan:  ? ?  ICD-10-CM   ?1. Cavitary lesion of lung  J98.4 CT CHEST WO CONTRAST  ?  ?2. Tobacco use  Z72.0   ?  ?3. Current smoker  F17.200   ?  ? ? ?Discussion: ? ?This is a 66 year old gentleman, cavitary lesion of the right lung.  Has a 4.7 cm thick walled lesion concerning for pneumonia since its rapidly developed since his last CT imaging in December of this past year. ? ?Plan: ?I think he needs a prolonged course of antibiotics. ?Hopefully we get this done before repeating his imaging. ?We will start him on Augmentin twice daily for 30 days. ?Repeat CT chest in June this will be approximately 6 weeks since his last CT. ?Patient to return to clinic to see me or APP after his 6-week follow-up CT image. ?If the lesion is still present then I think we need to  consider bronchoscopy for tissue cultures as well as biopsy. ? ? ?Current Outpatient Medications:  ?  amoxicillin-clavulanate (AUGMENTIN) 875-125 MG tablet, Take 1 tablet by mouth 2 (two) times daily., Disp: 60 tab

## 2022-02-14 DIAGNOSIS — M5416 Radiculopathy, lumbar region: Secondary | ICD-10-CM | POA: Diagnosis not present

## 2022-03-02 NOTE — Progress Notes (Unsigned)
Office Note     CC:  follow up Requesting Provider:  Glenda Chroman, MD  HPI: Leon Mitchell is a 66 y.o. (1955-11-29) male who presents for follow up of carotid artery disease. He has known 50-69% left ICA stenosis and 50% on right ICA by Duplex. 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 He denies any history of stroke or TIA. Today he denies any slurred speech, facial drooping, or unilateral upper or lower extremity numbness or weakness. He is having bilateral upper extremity numbness. He reports two herniated discs in his back that he is scheduled to have surgery on in the next several weeks. He does also report some visual changes in the left eye in particular. Very quick flashes across his left eye and also blurred vision in left eye from time to time. He has history of cataract surgery and does get his eyes checked yearly.  He denies any claudication symptoms, rest pain or tissue loss.   The pt is on a statin for cholesterol management.  The pt is on a daily aspirin.   Other AC:  none The pt is on ACE for hypertension.   The pt is not diabetic Tobacco hx:  current, 1 ppd  Past Medical History:  Diagnosis Date   Bulging lumbar disc    GERD (gastroesophageal reflux disease)    Hypertension    Kidney stones     Past Surgical History:  Procedure Laterality Date   APPENDECTOMY     BACK SURGERY  01/12/16   T 5&6   BACK SURGERY  04/2016   T 6-9   COLONOSCOPY N/A 01/06/2016   Procedure: COLONOSCOPY;  Surgeon: Rogene Houston, MD;  Location: [facility];  Service: Endoscopy;  Laterality: N/A;  730   HAND SURGERY     KNEE ARTHROSCOPY Left 2006    Social History   Socioeconomic History   Marital status: Widowed    Spouse name: Not on file   Number of children: 2   Years of education: 67   Highest education level: Not on file  Occupational History    Comment: Product manager, disability  Tobacco Use   Smoking status: Every Day     Packs/day: 1.00    Years: 40.00    Pack years: 40.00    Types: Cigarettes    Passive exposure: Never   Smokeless tobacco: Never  Vaping Use   Vaping Use: Never used  Substance and Sexual Activity   Alcohol use: Yes    Comment: very little; occasionally   Drug use: No   Sexual activity: Not on file  Other Topics Concern   Not on file  Social History Narrative   Mother lives with patient   Caffeine use- 3-4 glasses tea   Social Determinants of Health   Financial Resource Strain: Not on file  Food Insecurity: Not on file  Transportation Needs: Not on file  Physical Activity: Not on file  Stress: Not on file  Social Connections: Not on file  Intimate Partner Violence: Not on file    Family History  Problem Relation Age of Onset   Hypertension Mother    Hypertension Father    Hypertension Sister    Hypertension Brother     Current Outpatient Medications  Medication Sig Dispense Refill   diclofenac (VOLTAREN) 75 MG EC tablet Take by mouth.     gabapentin (NEURONTIN) 300 MG capsule Take 300 mg by mouth 3 (three) times daily.  lisinopril (PRINIVIL,ZESTRIL) 10 MG tablet Take 10 mg by mouth daily.  3   omeprazole (PRILOSEC) 40 MG capsule      oxyCODONE-acetaminophen (PERCOCET/ROXICET) 5-325 MG tablet Take by mouth.     traMADol (ULTRAM) 50 MG tablet Take 50 mg by mouth 4 (four) times daily.     Vitamin D, Ergocalciferol, (DRISDOL) 1.25 MG (50000 UNIT) CAPS capsule every 7 (seven) days.      No current facility-administered medications for this visit.   Facility-Administered Medications Ordered in Other Visits  Medication Dose Route Frequency Provider Last Rate Last Admin   gadopentetate dimeglumine (MAGNEVIST) injection 20 mL  20 mL Intravenous Once PRN Penumalli, Vikram R, MD        No Known Allergies   REVIEW OF SYSTEMS:  '[X]'$  denotes positive finding, '[ ]'$  denotes negative finding Cardiac  Comments:  Chest pain or chest pressure:    Shortness of breath upon  exertion:    Short of breath when lying flat:    Irregular heart rhythm:        Vascular    Pain in calf, thigh, or hip brought on by ambulation:    Pain in feet at night that wakes you up from your sleep:     Blood clot in your veins:    Leg swelling:         Pulmonary    Oxygen at home:    Productive cough:     Wheezing:         Neurologic    Sudden weakness in arms or legs:     Sudden numbness in arms or legs:     Sudden onset of difficulty speaking or slurred speech:    Temporary loss of vision in one eye:     Problems with dizziness:         Gastrointestinal    Blood in stool:     Vomited blood:         Genitourinary    Burning when urinating:     Blood in urine:        Psychiatric    Major depression:         Hematologic    Bleeding problems:    Problems with blood clotting too easily:        Skin    Rashes or ulcers:        Constitutional    Fever or chills:      PHYSICAL EXAMINATION:  Vitals:   03/10/22 0939 03/10/22 0940  BP: (!) 160/87 (!) 172/91  Pulse: (!) 51   Resp: 20   Temp: 97.6 F (36.4 C)   TempSrc: Temporal   SpO2: 98%   Weight: 206 lb (93.4 kg)   Height: 6' (1.829 m)     General:  very pleasant male; WDWN in NAD; vital signs documented above Gait: unsteady gate HENT: WNL, normocephalic Pulmonary: normal non-labored breathing , without wheezing Cardiac: regular HR, without  Murmurs with left carotid bruit Abdomen: soft, NT, no masses Vascular Exam/Pulses:2+ radial pulses bilaterally, 2+ DP and PT pulses bilaterally Extremities: without ischemic changes, without Gangrene , without cellulitis; without open wounds;  Musculoskeletal: no muscle wasting or atrophy  Neurologic: A&O X 3;  No focal weakness or paresthesias are detected Psychiatric:  The pt has Normal affect.   Non-Invasive Vascular Imaging:   Summary:  Right Carotid: Velocities in the right ICA are consistent with a 1-39% stenosis.   Left Carotid: Velocities in the  left ICA are consistent with  a 60-79% stenosis. End diastolic velocity is bordering on the 80-99% stenosis  category.   Vertebrals:  Bilateral vertebral arteries demonstrate antegrade flow. The right vertebral artery waveform is normal at the origin, blunted  more distally.  Subclavians: Normal flow hemodynamics were seen in bilateral subclavian arteries.     ASSESSMENT/PLAN:: 66 y.o. male here for follow up for carotid artery stenosis. He is not having typical amaurosis or visual loss but he is having some vision changes in left eye that are concerning. He otherwise does not have any new neurological symptoms. - Duplex today shows right ICA stenosis of 1-39% ( 50% on prior imaging not reproducible today). Left ICA stenosis in the 80-99% range. This has increased from prior duplex 6 month ago - He will continue Aspirin and Statin - Reviewed signs and symptoms of TIA/ Stroke and he understands should this occur to seek immediate medical attentiion - I have recommended further carotid evaluation with CTA angio neck. Will have this arranged over next several weeks and he will follow up with Dr. Donzetta Matters to discuss findings   Karoline Caldwell, PA-C [facility] [phone]  Clinic MD:   [facility]/ Donzetta Matters

## 2022-03-03 DIAGNOSIS — M5416 Radiculopathy, lumbar region: Secondary | ICD-10-CM | POA: Diagnosis not present

## 2022-03-03 DIAGNOSIS — Z6827 Body mass index (BMI) 27.0-27.9, adult: Secondary | ICD-10-CM | POA: Diagnosis not present

## 2022-03-03 DIAGNOSIS — M7130 Other bursal cyst, unspecified site: Secondary | ICD-10-CM | POA: Diagnosis not present

## 2022-03-03 DIAGNOSIS — M7138 Other bursal cyst, other site: Secondary | ICD-10-CM | POA: Diagnosis not present

## 2022-03-10 ENCOUNTER — Ambulatory Visit (INDEPENDENT_AMBULATORY_CARE_PROVIDER_SITE_OTHER): Payer: Medicare Other | Admitting: Physician Assistant

## 2022-03-10 ENCOUNTER — Ambulatory Visit (HOSPITAL_COMMUNITY)
Admission: RE | Admit: 2022-03-10 | Discharge: 2022-03-10 | Disposition: A | Discharge status: Home / Self Care | Payer: Medicare Other | Source: Ambulatory Visit | Attending: Vascular Surgery | Admitting: Vascular Surgery

## 2022-03-10 VITALS — BP 172/91 | HR 51 | Temp 97.6°F | Resp 20 | Ht 72.0 in | Wt 206.0 lb

## 2022-03-10 DIAGNOSIS — I6523 Occlusion and stenosis of bilateral carotid arteries: Secondary | ICD-10-CM

## 2022-03-16 ENCOUNTER — Other Ambulatory Visit: Payer: Self-pay

## 2022-03-16 DIAGNOSIS — I6523 Occlusion and stenosis of bilateral carotid arteries: Secondary | ICD-10-CM

## 2022-03-17 ENCOUNTER — Other Ambulatory Visit: Payer: Self-pay | Admitting: Neurological Surgery

## 2022-03-17 ENCOUNTER — Ambulatory Visit (HOSPITAL_COMMUNITY)
Admission: RE | Admit: 2022-03-17 | Discharge: 2022-03-17 | Disposition: A | Discharge status: Home / Self Care | Payer: Medicare Other | Source: Ambulatory Visit | Attending: Vascular Surgery | Admitting: Vascular Surgery

## 2022-03-17 DIAGNOSIS — I6523 Occlusion and stenosis of bilateral carotid arteries: Secondary | ICD-10-CM | POA: Diagnosis not present

## 2022-03-17 DIAGNOSIS — I6503 Occlusion and stenosis of bilateral vertebral arteries: Secondary | ICD-10-CM | POA: Diagnosis not present

## 2022-03-17 DIAGNOSIS — I771 Stricture of artery: Secondary | ICD-10-CM | POA: Diagnosis not present

## 2022-03-17 LAB — POCT I-STAT CREATININE: Creatinine, Ser: 1 mg/dL (ref 0.61–1.24)

## 2022-03-17 MED ORDER — IOHEXOL 350 MG/ML SOLN
75.0000 mL | Freq: Once | INTRAVENOUS | Status: AC | PRN
  Administered 2022-03-17: 75 mL via INTRAVENOUS

## 2022-03-23 ENCOUNTER — Encounter: Payer: Self-pay | Admitting: Vascular Surgery

## 2022-03-23 ENCOUNTER — Other Ambulatory Visit: Payer: Self-pay

## 2022-03-23 ENCOUNTER — Ambulatory Visit: Payer: Medicare Other | Admitting: Vascular Surgery

## 2022-03-23 ENCOUNTER — Ambulatory Visit (INDEPENDENT_AMBULATORY_CARE_PROVIDER_SITE_OTHER): Payer: Medicare Other | Admitting: Vascular Surgery

## 2022-03-23 VITALS — BP 152/94 | HR 59 | Temp 98.3°F | Resp 20 | Ht 72.0 in | Wt 204.8 lb

## 2022-03-23 DIAGNOSIS — I6523 Occlusion and stenosis of bilateral carotid arteries: Secondary | ICD-10-CM

## 2022-03-23 DIAGNOSIS — I6522 Occlusion and stenosis of left carotid artery: Secondary | ICD-10-CM

## 2022-03-23 NOTE — Progress Notes (Signed)
Patient ID: Leon Mitchell, male   DOB: [birth year], 66 y.o.   MRN: [id number]  Reason for Consult: Follow-up   Referred by Glenda Chroman, MD  Subjective:     HPI:  Leon Mitchell is a 66 y.o. male with known left ICA stenosis.  Denies previous history of stroke, TIA or amaurosis.  He has had some blurry vision issues particularly in the left eye which he relates to his cataracts.  He does have back pain is scheduled for back surgery in the near future but denies any claudication rest pain or tissue loss.  He does take aspirin daily.  Past Medical History:  Diagnosis Date   Bulging lumbar disc    GERD (gastroesophageal reflux disease)    Hypertension    Kidney stones    Family History  Problem Relation Age of Onset   Hypertension Mother    Hypertension Father    Hypertension Sister    Hypertension Brother    Past Surgical History:  Procedure Laterality Date   APPENDECTOMY     BACK SURGERY  01/12/16   T 5&6   BACK SURGERY  04/2016   T 6-9   COLONOSCOPY N/A 01/06/2016   Procedure: COLONOSCOPY;  Surgeon: Rogene Houston, MD;  Location: [facility];  Service: Endoscopy;  Laterality: N/A;  730   HAND SURGERY     KNEE ARTHROSCOPY Left 2006    Short Social History:  Social History   Tobacco Use   Smoking status: Every Day    Packs/day: 1.00    Years: 40.00    Total pack years: 40.00    Types: Cigarettes    Passive exposure: Never   Smokeless tobacco: Never  Substance Use Topics   Alcohol use: Yes    Comment: very little; occasionally    No Known Allergies  Current Outpatient Medications  Medication Sig Dispense Refill   diclofenac (VOLTAREN) 75 MG EC tablet Take by mouth.     gabapentin (NEURONTIN) 300 MG capsule Take 300 mg by mouth 3 (three) times daily.     lisinopril (PRINIVIL,ZESTRIL) 10 MG tablet Take 10 mg by mouth daily.  3   omeprazole (PRILOSEC) 40 MG capsule      oxyCODONE-acetaminophen (PERCOCET/ROXICET) 5-325 MG tablet Take by mouth.     traMADol  (ULTRAM) 50 MG tablet Take 50 mg by mouth 4 (four) times daily.     Vitamin D, Ergocalciferol, (DRISDOL) 1.25 MG (50000 UNIT) CAPS capsule every 7 (seven) days.      No current facility-administered medications for this visit.   Facility-Administered Medications Ordered in Other Visits  Medication Dose Route Frequency Provider Last Rate Last Admin   gadopentetate dimeglumine (MAGNEVIST) injection 20 mL  20 mL Intravenous Once PRN Penumalli, Earlean Polka, MD        Review of Systems  Constitutional:  Constitutional negative. HENT: HENT negative.  Eyes: Positive for visual disturbance.   Respiratory: Respiratory negative.  Cardiovascular: Cardiovascular negative.  GI: Gastrointestinal negative.  Musculoskeletal: Musculoskeletal negative.  Skin: Skin negative.  Neurological: Neurological negative. Hematologic: Hematologic/lymphatic negative.  Psychiatric: Psychiatric negative.        Objective:  Objective   Vitals:   03/23/22 1004 03/23/22 1006  BP: 123/84 (!) 152/94  Pulse: (!) 59   Resp: 20   Temp: 98.3 F (36.8 C)   SpO2: 97%   Weight: 204 lb 12.8 oz (92.9 kg)   Height: 6' (1.829 m)    Body mass index is 27.78 kg/m.  Physical Exam HENT:     Head: Normocephalic.     Nose: Nose normal.  Eyes:     Pupils: Pupils are equal, round, and reactive to light.  Neck:     Vascular: No carotid bruit.  Cardiovascular:     Rate and Rhythm: Normal rate.  Pulmonary:     Effort: Pulmonary effort is normal.  Abdominal:     General: Abdomen is flat.     Palpations: Abdomen is soft. There is no mass.  Skin:    General: Skin is warm.     Capillary Refill: Capillary refill takes less than 2 seconds.  Neurological:     General: No focal deficit present.     Mental Status: He is alert.  Psychiatric:        Mood and Affect: Mood normal.        Behavior: Behavior normal.        Thought Content: Thought content normal.        Judgment: Judgment normal.     Data: CT  IMPRESSION: 1. 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 Findings could be related to prior trauma and/or ulcerated atherosclerosis. 2. 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 3. Approximately 1.7 x 1.6 cm cavitary lesion in the right upper lobe, partially imaged and new since CT of the chest from 09/21/2021. Recommend dedicated chest CT (preferably with contrast) to fully evaluate.     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  He denies any history of trauma or surgical intervention to the left neck.  He continues on aspirin.  We have discussed smoking cessation.  We will plan for cardiac clearance and left carotid endarterectomy in the near future.  I discussed the risk of benefits of the procedure including the risk of stroke injury to surrounding structures including cranial nerves totaling approximately 5% in an asymptomatic patient he demonstrates good understanding.     Waynetta Sandy MD [facility]

## 2022-03-23 NOTE — H&P (View-Only) (Signed)
Patient ID: Leon Mitchell, male   DOB: 24-Oct-1955, 66 y.o.   MRN: [id number]  Reason for Consult: Follow-up   Referred by Glenda Chroman, MD  Subjective:     HPI:  Leon Mitchell is a 66 y.o. male with known left ICA stenosis.  Denies previous history of stroke, TIA or amaurosis.  He has had some blurry vision issues particularly in the left eye which he relates to his cataracts.  He does have back pain is scheduled for back surgery in the near future but denies any claudication rest pain or tissue loss.  He does take aspirin daily.  Past Medical History:  Diagnosis Date   Bulging lumbar disc    GERD (gastroesophageal reflux disease)    Hypertension    Kidney stones    Family History  Problem Relation Age of Onset   Hypertension Mother    Hypertension Father    Hypertension Sister    Hypertension Brother    Past Surgical History:  Procedure Laterality Date   APPENDECTOMY     BACK SURGERY  01/12/16   T 5&6   BACK SURGERY  04/2016   T 6-9   COLONOSCOPY N/A 01/06/2016   Procedure: COLONOSCOPY;  Surgeon: Rogene Houston, MD;  Location: [facility];  Service: Endoscopy;  Laterality: N/A;  730   HAND SURGERY     KNEE ARTHROSCOPY Left 2006    Short Social History:  Social History   Tobacco Use   Smoking status: Every Day    Packs/day: 1.00    Years: 40.00    Total pack years: 40.00    Types: Cigarettes    Passive exposure: Never   Smokeless tobacco: Never  Substance Use Topics   Alcohol use: Yes    Comment: very little; occasionally    No Known Allergies  Current Outpatient Medications  Medication Sig Dispense Refill   diclofenac (VOLTAREN) 75 MG EC tablet Take by mouth.     gabapentin (NEURONTIN) 300 MG capsule Take 300 mg by mouth 3 (three) times daily.     lisinopril (PRINIVIL,ZESTRIL) 10 MG tablet Take 10 mg by mouth daily.  3   omeprazole (PRILOSEC) 40 MG capsule      oxyCODONE-acetaminophen (PERCOCET/ROXICET) 5-325 MG tablet Take by mouth.     traMADol  (ULTRAM) 50 MG tablet Take 50 mg by mouth 4 (four) times daily.     Vitamin D, Ergocalciferol, (DRISDOL) 1.25 MG (50000 UNIT) CAPS capsule every 7 (seven) days.      No current facility-administered medications for this visit.   Facility-Administered Medications Ordered in Other Visits  Medication Dose Route Frequency Provider Last Rate Last Admin   gadopentetate dimeglumine (MAGNEVIST) injection 20 mL  20 mL Intravenous Once PRN Penumalli, Earlean Polka, MD        Review of Systems  Constitutional:  Constitutional negative. HENT: HENT negative.  Eyes: Positive for visual disturbance.   Respiratory: Respiratory negative.  Cardiovascular: Cardiovascular negative.  GI: Gastrointestinal negative.  Musculoskeletal: Musculoskeletal negative.  Skin: Skin negative.  Neurological: Neurological negative. Hematologic: Hematologic/lymphatic negative.  Psychiatric: Psychiatric negative.        Objective:  Objective   Vitals:   03/23/22 1004 03/23/22 1006  BP: 123/84 (!) 152/94  Pulse: (!) 59   Resp: 20   Temp: 98.3 F (36.8 C)   SpO2: 97%   Weight: 204 lb 12.8 oz (92.9 kg)   Height: 6' (1.829 m)    Body mass index is 27.78 kg/m.  Physical Exam HENT:     Head: Normocephalic.     Nose: Nose normal.  Eyes:     Pupils: Pupils are equal, round, and reactive to light.  Neck:     Vascular: No carotid bruit.  Cardiovascular:     Rate and Rhythm: Normal rate.  Pulmonary:     Effort: Pulmonary effort is normal.  Abdominal:     General: Abdomen is flat.     Palpations: Abdomen is soft. There is no mass.  Skin:    General: Skin is warm.     Capillary Refill: Capillary refill takes less than 2 seconds.  Neurological:     General: No focal deficit present.     Mental Status: He is alert.  Psychiatric:        Mood and Affect: Mood normal.        Behavior: Behavior normal.        Thought Content: Thought content normal.        Judgment: Judgment normal.     Data: CT  IMPRESSION: 1. 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 Findings could be related to prior trauma and/or ulcerated atherosclerosis. 2. 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 3. Approximately 1.7 x 1.6 cm cavitary lesion in the right upper lobe, partially imaged and new since CT of the chest from 09/21/2021. Recommend dedicated chest CT (preferably with contrast) to fully evaluate.     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  He denies any history of trauma or surgical intervention to the left neck.  He continues on aspirin.  We have discussed smoking cessation.  We will plan for cardiac clearance and left carotid endarterectomy in the near future.  I discussed the risk of benefits of the procedure including the risk of stroke injury to surrounding structures including cranial nerves totaling approximately 5% in an asymptomatic patient he demonstrates good understanding.     Waynetta Sandy MD [facility]

## 2022-03-25 ENCOUNTER — Ambulatory Visit (HOSPITAL_COMMUNITY)
Admission: RE | Admit: 2022-03-25 | Discharge: 2022-03-25 | Disposition: A | Discharge status: Home / Self Care | Payer: Medicare Other | Source: Ambulatory Visit | Attending: Pulmonary Disease | Admitting: Pulmonary Disease

## 2022-03-25 DIAGNOSIS — J984 Other disorders of lung: Secondary | ICD-10-CM | POA: Diagnosis not present

## 2022-03-25 DIAGNOSIS — J189 Pneumonia, unspecified organism: Secondary | ICD-10-CM | POA: Diagnosis not present

## 2022-03-28 ENCOUNTER — Ambulatory Visit (INDEPENDENT_AMBULATORY_CARE_PROVIDER_SITE_OTHER): Payer: Medicare Other | Admitting: Internal Medicine

## 2022-03-28 ENCOUNTER — Encounter: Payer: Self-pay | Admitting: Internal Medicine

## 2022-03-28 VITALS — BP 142/80 | HR 66 | Ht 72.0 in | Wt 204.6 lb

## 2022-03-28 DIAGNOSIS — Z01810 Encounter for preprocedural cardiovascular examination: Secondary | ICD-10-CM | POA: Diagnosis not present

## 2022-03-28 MED ORDER — LISINOPRIL 20 MG PO TABS: 20.0000 mg | ORAL_TABLET | Freq: Every day | ORAL | 3 refills | Status: DC

## 2022-03-28 NOTE — Progress Notes (Signed)
Cardiology Office Note:    Date:  03/28/2022   ID:  Leon Mitchell, DOB 1956/02/25, MRN [id number]  PCP:  Leon Chroman, MD   [facility] Providers Cardiologist:  None     Referring MD: Leon Mo*   No chief complaint on file. Pre-op Cardiac Risk Stratification  History of Present Illness:    Leon Mitchell is a 66 y.o. male with a hx of HTN, GERD, current smoker, amaurosis fugax, referral for pre-op for L carotid endarterectomy   He has no hx of cardiac dx. He is an active smoker. He tried chantix in the past that was helping. Blood pressures is not optimally controlled. He notes he has crestor bottles at home but is not taking them.   He can walk up 2  flight of stairs and denies SOB and no chest pressure. He has no cardiac hx.  He is continuously active.  He is planned tentatively for 7/11 for his procedure.  Cardiovascular Studies:  03/10/2022 Carotid Duplex  Summary:  Right Carotid: Velocities in the right ICA are consistent with a 1-39%  stenosis.  Left Carotid: Velocities in the left ICA are consistent with a 60-79%  stenosis.  End diastolic velocity is bordering on the 80-99% stenosis                category.   Vertebrals:  Bilateral vertebral arteries demonstrate antegrade flow. The  Right vertebral artery waveform is normal at the origin, blunted  more distally.  Subclavians: Normal flow hemodynamics were seen in bilateral subclavian               arteries.    CT angio neck 03/17/2022 CT IMPRESSION: 1. 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 Findings could be related to prior trauma and/or ulcerated atherosclerosis. 2. 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 3. Approximately 1.7 x 1.6 cm cavitary lesion in the right upper lobe, partially imaged and new  since CT of the chest from 09/21/2021. Recommend dedicated chest CT (preferably with contrast) to fully evaluate.  Past Medical History:  Diagnosis Date   Bulging lumbar disc    GERD (gastroesophageal reflux disease)    Hypertension    Kidney stones     Past Surgical History:  Procedure Laterality Date   APPENDECTOMY     BACK SURGERY  01/12/16   T 5&6   BACK SURGERY  04/2016   T 6-9   COLONOSCOPY N/A 01/06/2016   Procedure: COLONOSCOPY;  Surgeon: Rogene Houston, MD;  Location: [facility];  Service: Endoscopy;  Laterality: N/A;  730   HAND SURGERY     KNEE ARTHROSCOPY Left 2006    Current Medications: Current Meds  Medication Sig   aspirin EC 81 MG tablet Take 81 mg by mouth daily. Swallow whole.   diclofenac (VOLTAREN) 75 MG EC tablet Take 75 mg by mouth 2 (two) times daily.   gabapentin (NEURONTIN) 600 MG tablet Take 600 mg by mouth 3 (three) times daily.   oxyCODONE-acetaminophen (PERCOCET/ROXICET) 5-325 MG tablet Take 1 tablet by mouth every 6 (six) hours as needed for severe pain.   traMADol (ULTRAM) 50 MG tablet Take 50 mg by mouth every 6 (six) hours as needed for severe pain.   Vitamin D, Ergocalciferol, (DRISDOL) 1.25 MG (50000 UNIT) CAPS capsule Take 50,000 Units by mouth every Sunday.   [  DISCONTINUED] lisinopril (ZESTRIL) 2.5 MG tablet Take 2.5 mg by mouth daily.     Allergies:   Patient has no known allergies.   Social History   Socioeconomic History   Marital status: Widowed    Spouse name: Not on file   Number of children: 2   Years of education: 62   Highest education level: Not on file  Occupational History    Comment: Product manager, disability  Tobacco Use   Smoking status: Every Day    Packs/day: 1.00    Years: 40.00    Total pack years: 40.00    Types: Cigarettes    Passive exposure: Never   Smokeless tobacco: Never  Vaping Use   Vaping Use: Never used  Substance and Sexual Activity   Alcohol use: Yes    Comment: very little;  occasionally   Drug use: No   Sexual activity: Not on file  Other Topics Concern   Not on file  Social History Narrative   Mother lives with patient   Caffeine use- 3-4 glasses tea   Social Determinants of Health   Financial Resource Strain: Not on file  Food Insecurity: Not on file  Transportation Needs: Not on file  Physical Activity: Not on file  Stress: Not on file  Social Connections: Not on file     Family History: The patient's family history includes Hypertension in his brother, father, mother, and sister.  ROS:   Please see the history of present illness.     All other systems reviewed and are negative.  EKGs/Labs/Other Studies Reviewed:    The following studies were reviewed today:   EKG:  EKG is  ordered today.  The ekg ordered today demonstrates   03/28/2022- NSR  Recent Labs: 03/17/2022: Creatinine, Ser 1.00   Recent Lipid Panel No results found for: "CHOL", "TRIG", "HDL", "CHOLHDL", "VLDL", "LDLCALC", "LDLDIRECT"   Risk Assessment/Calculations:           Physical Exam:    VS:    Vitals:   03/28/22 1453  BP: (!) 142/80  Pulse: 66  SpO2: 98%     Wt Readings from Last 3 Encounters:  03/28/22 204 lb 9.6 oz (92.8 kg)  03/23/22 204 lb 12.8 oz (92.9 kg)  03/10/22 206 lb (93.4 kg)     GEN:  Well nourished, well developed in no acute distress HEENT: Normal NECK: No JVD; No audible carotid bruits LYMPHATICS: No lymphadenopathy CARDIAC: RRR, no murmurs, rubs, gallops RESPIRATORY:  Clear to auscultation without rales, wheezing or rhonchi  ABDOMEN: Soft, non-tender, non-distended MUSCULOSKELETAL:  No edema; No deformity  SKIN: Warm and dry NEUROLOGIC:  Alert and oriented x 3 PSYCHIATRIC:  Normal affect   ASSESSMENT:    Pre Operative Cardiac Risk Stratification: reports he can walk up a couple flights of stairs without symptoms. He denies angina or dyspnea on exertion. His 30 day mortality risk 2/2 cardiac event is low (RCRI  3.9%).  Carotid Artery Dx: he was planned to start crestor, notes it is at home. Don't see in our list. He is supposed to call back with the dose he has at home. Do recommend statin therapy.  Continue asa 81 mg daily.   Planned for L endarterectomy for significant dx and symptoms with vision changes c/f possible amaurosis fugax  HLD: LDL goal < 70 mg/dL Can recheck after he starts therapy  HTN: increased lisinopril to 20 mg daily from 25 mg. He is hesitant to start additional. If not well controlled can max  to 40 mg and had norvasc if needed.  Smoking cessation: He has chantix; we discussed continuing this. If this does not help, recommend patches  PLAN:    In order of problems listed above:  Acceptable cardiac risk for L carotid endarterectomy, no plans for further risk assessment Increase Lisinopril 20 mg daily Follow up in 6 months      Medication Adjustments/Labs and Tests Ordered: Current medicines are reviewed at length with the patient today.  Concerns regarding medicines are outlined above.  Orders Placed This Encounter  Procedures   EKG 12-Lead   Meds ordered this encounter  Medications   lisinopril (ZESTRIL) 20 MG tablet    Sig: Take 1 tablet (20 mg total) by mouth daily.    Dispense:  90 tablet    Refill:  3    Dose increase    Patient Instructions  Medication Instructions:  INCREASE lisinopril to 20 mg daily  --call to let us know your dose of rosuvastatin (Crestor)  *If you need a refill on your cardiac medications before your next appointment, please call your pharmacy*  Follow-Up: At [facility], you and your health needs are our priority.  As part of our continuing mission to provide you with exceptional heart care, we have created designated Provider Care Teams.  These Care Teams include your primary Cardiologist (physician) and Advanced Practice Providers (APPs -  Physician Assistants and Nurse Practitioners) who all work together to provide you with  the care you need, when you need it.  We recommend signing up for the patient portal called "MyChart".  Sign up information is provided on this After Visit Summary.  MyChart is used to connect with patients for Virtual Visits (Telemedicine).  Patients are able to view lab/test results, encounter notes, upcoming appointments, etc.  Non-urgent messages can be sent to your provider as well.   To learn more about what you can do with MyChart, go to [email or web address].    Your next appointment:   6 month(s)  The format for your next appointment:   In Person  Provider:   Dr. Harl Bowie   Important Information About Sugar         Signed, Janina Mayo, MD  03/28/2022 3:33 PM    [place]

## 2022-03-28 NOTE — Patient Instructions (Signed)
Medication Instructions:  INCREASE lisinopril to 20 mg daily  --call to let us know your dose of rosuvastatin (Crestor)  *If you need a refill on your cardiac medications before your next appointment, please call your pharmacy*  Follow-Up: At [facility], you and your health needs are our priority.  As part of our continuing mission to provide you with exceptional heart care, we have created designated Provider Care Teams.  These Care Teams include your primary Cardiologist (physician) and Advanced Practice Providers (APPs -  Physician Assistants and Nurse Practitioners) who all work together to provide you with the care you need, when you need it.  We recommend signing up for the patient portal called "MyChart".  Sign up information is provided on this After Visit Summary.  MyChart is used to connect with patients for Virtual Visits (Telemedicine).  Patients are able to view lab/test results, encounter notes, upcoming appointments, etc.  Non-urgent messages can be sent to your provider as well.   To learn more about what you can do with MyChart, go to [email or web address].    Your next appointment:   6 month(s)  The format for your next appointment:   In Person  Provider:   Dr. Harl Bowie   Important Information About Sugar

## 2022-03-29 NOTE — Pre-Procedure Instructions (Signed)
Surgical Instructions    Your procedure is scheduled on Thursday, June 29th.  Report to [facility] Main Entrance "A" at 05:30 A.M., then check in with the Admitting office.  Call this number if you have problems the morning of surgery:  [phone]   If you have any questions prior to your surgery date call [phone]: Open Monday-Friday 8am-4pm    Remember:  Do not eat after midnight the night before your surgery  You may drink clear liquids until 04:30 AM the morning of your surgery.   Clear liquids allowed are: Water, Non-Citrus Juices (without pulp), Carbonated Beverages, Clear Tea, Black Coffee Only (NO MILK, CREAM OR POWDERED CREAMER of any kind), and Gatorade.    Take these medicines the morning of surgery with A SIP OF WATER  gabapentin (NEURONTIN) oxyCODONE-acetaminophen (PERCOCET/ROXICET)- if needed traMADol (ULTRAM)- if needed   Follow your surgeon's instructions on when to stop Aspirin.  If no instructions were given by your surgeon then you will need to call the office to get those instructions.     As of today, STOP taking any Aspirin (unless otherwise instructed by your surgeon) Aleve, Naproxen, Ibuprofen, Motrin, Advil, Goody's, BC's, all herbal medications, fish oil, and all vitamins. This includes diclofenac (VOLTAREN).                     Do NOT Smoke (Tobacco/Vaping) for 24 hours prior to your procedure.  If you use a CPAP at night, you may bring your mask/headgear for your overnight stay.   Contacts, glasses, piercing's, hearing aid's, dentures or partials may not be worn into surgery, please bring cases for these belongings.    For patients admitted to the hospital, discharge time will be determined by your treatment team.   Patients discharged the day of surgery will not be allowed to drive home, and someone needs to stay with them for 24 hours.  SURGICAL WAITING ROOM VISITATION Patients having surgery or a procedure may have two support people in the  waiting room. These visitors may be switched out with other visitors if needed. Children under the age of 85 must have an adult accompany them who is not the patient. If the patient needs to stay at the hospital during part of their recovery, the visitor guidelines for inpatient rooms apply.  Please refer to the [facility] website for the visitor guidelines for Inpatients (after your surgery is over and you are in a regular room).    Special instructions:   [facility]- Preparing For Surgery  Before surgery, you can play an important role. Because skin is not sterile, your skin needs to be as free of germs as possible. You can reduce the number of germs on your skin by washing with CHG (chlorahexidine gluconate) Soap before surgery.  CHG is an antiseptic cleaner which kills germs and bonds with the skin to continue killing germs even after washing.    Oral Hygiene is also important to reduce your risk of infection.  Remember - BRUSH YOUR TEETH THE MORNING OF SURGERY WITH YOUR REGULAR TOOTHPASTE  Please do not use if you have an allergy to CHG or antibacterial soaps. If your skin becomes reddened/irritated stop using the CHG.  Do not shave (including legs and underarms) for at least 48 hours prior to first CHG shower. It is OK to shave your face.  Please follow these instructions carefully.   Shower the NIGHT BEFORE SURGERY and the MORNING OF SURGERY  If you chose to wash  your hair, wash your hair first as usual with your normal shampoo.  After you shampoo, rinse your hair and body thoroughly to remove the shampoo.  Use CHG Soap as you would any other liquid soap. You can apply CHG directly to the skin and wash gently with a scrungie or a clean washcloth.   Apply the CHG Soap to your body ONLY FROM THE NECK DOWN.  Do not use on open wounds or open sores. Avoid contact with your eyes, ears, mouth and genitals (private parts). Wash Face and genitals (private parts)  with your normal soap.    Wash thoroughly, paying special attention to the area where your surgery will be performed.  Thoroughly rinse your body with warm water from the neck down.  DO NOT shower/wash with your normal soap after using and rinsing off the CHG Soap.  Pat yourself dry with a CLEAN TOWEL.  Wear CLEAN PAJAMAS to bed the night before surgery  Place CLEAN SHEETS on your bed the night before your surgery  DO NOT SLEEP WITH PETS.   Day of Surgery: Take a shower with CHG soap. Do not wear jewelry or makeup Do not wear lotions, powders, colognes, or deodorant. Men may shave face and neck. Do not bring valuables to the hospital.  [facility] is not responsible for any belongings or valuables.  Wear Clean/Comfortable clothing the morning of surgery Remember to brush your teeth WITH YOUR REGULAR TOOTHPASTE.   Please read over the following fact sheets that you were given.    If you received a COVID test during your pre-op visit  it is requested that you wear a mask when out in public, stay away from anyone that may not be feeling well and notify your surgeon if you develop symptoms. If you have been in contact with anyone that has tested positive in the last 10 days please notify you surgeon.

## 2022-03-30 ENCOUNTER — Encounter (HOSPITAL_COMMUNITY)
Admission: RE | Admit: 2022-03-30 | Discharge: 2022-03-30 | Disposition: A | Discharge status: Home / Self Care | Payer: Medicare Other | Source: Ambulatory Visit | Attending: Neurological Surgery | Admitting: Neurological Surgery

## 2022-03-30 VITALS — BP 145/99 | HR 65 | Temp 97.6°F | Resp 18 | Ht 72.0 in | Wt 204.7 lb

## 2022-03-30 DIAGNOSIS — Z01818 Encounter for other preprocedural examination: Secondary | ICD-10-CM

## 2022-03-30 DIAGNOSIS — I10 Essential (primary) hypertension: Secondary | ICD-10-CM

## 2022-03-30 NOTE — Progress Notes (Signed)
Pt arrived to hospital for pre-op appointment. Patient states his surgery with Dr. Zada Finders is cancelled. Pt states he will call to re-schedule appointment for pre-op for surgery with Dr. Donzetta Matters.

## 2022-03-30 NOTE — Pre-Procedure Instructions (Signed)
Surgical Instructions    Your procedure is scheduled on Thursday, June 29th.  Report to [facility] Main Entrance "A" at 05:30 A.M., then check in with the Admitting office.  Call this number if you have problems the morning of surgery:  [phone]   If you have any questions prior to your surgery date call [phone]: Open Monday-Friday 8am-4pm    Remember:  Do not eat after midnight the night before your surgery  You may drink clear liquids until 04:30 AM the morning of your surgery.   Clear liquids allowed are: Water, Non-Citrus Juices (without pulp), Carbonated Beverages, Clear Tea, Black Coffee Only (NO MILK, CREAM OR POWDERED CREAMER of any kind), and Gatorade.    Take these medicines the morning of surgery with A SIP OF WATER  gabapentin (NEURONTIN) oxyCODONE-acetaminophen (PERCOCET/ROXICET)- if needed traMADol (ULTRAM)- if needed   Follow your surgeon's instructions on when to stop Aspirin.  If no instructions were given by your surgeon then you will need to call the office to get those instructions.     As of today, STOP taking any Aleve, Naproxen, Ibuprofen, Motrin, Advil, Goody's, BC's, all herbal medications, fish oil, and all vitamins. This includes diclofenac (VOLTAREN).                     Do NOT Smoke (Tobacco/Vaping) for 24 hours prior to your procedure.  If you use a CPAP at night, you may bring your mask/headgear for your overnight stay.   Contacts, glasses, piercing's, hearing aid's, dentures or partials may not be worn into surgery, please bring cases for these belongings.    For patients admitted to the hospital, discharge time will be determined by your treatment team.   Patients discharged the day of surgery will not be allowed to drive home, and someone needs to stay with them for 24 hours.  SURGICAL WAITING ROOM VISITATION Patients having surgery or a procedure may have two support people in the waiting room. These visitors may be switched out with  other visitors if needed. Children under the age of 39 must have an adult accompany them who is not the patient. If the patient needs to stay at the hospital during part of their recovery, the visitor guidelines for inpatient rooms apply.  Please refer to the [facility] website for the visitor guidelines for Inpatients (after your surgery is over and you are in a regular room).    Special instructions:   [place]- Preparing For Surgery  Before surgery, you can play an important role. Because skin is not sterile, your skin needs to be as free of germs as possible. You can reduce the number of germs on your skin by washing with CHG (chlorahexidine gluconate) Soap before surgery.  CHG is an antiseptic cleaner which kills germs and bonds with the skin to continue killing germs even after washing.    Oral Hygiene is also important to reduce your risk of infection.  Remember - BRUSH YOUR TEETH THE MORNING OF SURGERY WITH YOUR REGULAR TOOTHPASTE  Please do not use if you have an allergy to CHG or antibacterial soaps. If your skin becomes reddened/irritated stop using the CHG.  Do not shave (including legs and underarms) for at least 48 hours prior to first CHG shower. It is OK to shave your face.  Please follow these instructions carefully.   Shower the NIGHT BEFORE SURGERY and the MORNING OF SURGERY  If you chose to wash your hair, wash your hair first as  usual with your normal shampoo.  After you shampoo, rinse your hair and body thoroughly to remove the shampoo.  Use CHG Soap as you would any other liquid soap. You can apply CHG directly to the skin and wash gently with a scrungie or a clean washcloth.   Apply the CHG Soap to your body ONLY FROM THE NECK DOWN.  Do not use on open wounds or open sores. Avoid contact with your eyes, ears, mouth and genitals (private parts). Wash Face and genitals (private parts)  with your normal soap.   Wash thoroughly, paying special attention to the area  where your surgery will be performed.  Thoroughly rinse your body with warm water from the neck down.  DO NOT shower/wash with your normal soap after using and rinsing off the CHG Soap.  Pat yourself dry with a CLEAN TOWEL.  Wear CLEAN PAJAMAS to bed the night before surgery  Place CLEAN SHEETS on your bed the night before your surgery  DO NOT SLEEP WITH PETS.   Day of Surgery: Take a shower with CHG soap. Do not wear jewelry or makeup Do not wear lotions, powders, colognes, or deodorant. Men may shave face and neck. Do not bring valuables to the hospital.  [facility] is not responsible for any belongings or valuables.  Wear Clean/Comfortable clothing the morning of surgery Remember to brush your teeth WITH YOUR REGULAR TOOTHPASTE.   Please read over the following fact sheets that you were given.    If you received a COVID test during your pre-op visit  it is requested that you wear a mask when out in public, stay away from anyone that may not be feeling well and notify your surgeon if you develop symptoms. If you have been in contact with anyone that has tested positive in the last 10 days please notify you surgeon.

## 2022-04-05 ENCOUNTER — Ambulatory Visit (INDEPENDENT_AMBULATORY_CARE_PROVIDER_SITE_OTHER): Payer: Medicare Other | Admitting: Acute Care

## 2022-04-05 ENCOUNTER — Encounter: Payer: Self-pay | Admitting: Acute Care

## 2022-04-05 VITALS — BP 128/80 | HR 76 | Ht 72.0 in | Wt 201.4 lb

## 2022-04-05 DIAGNOSIS — R911 Solitary pulmonary nodule: Secondary | ICD-10-CM

## 2022-04-05 DIAGNOSIS — I6523 Occlusion and stenosis of bilateral carotid arteries: Secondary | ICD-10-CM | POA: Diagnosis not present

## 2022-04-05 DIAGNOSIS — Z720 Tobacco use: Secondary | ICD-10-CM

## 2022-04-05 DIAGNOSIS — F17210 Nicotine dependence, cigarettes, uncomplicated: Secondary | ICD-10-CM

## 2022-04-05 DIAGNOSIS — J329 Chronic sinusitis, unspecified: Secondary | ICD-10-CM | POA: Diagnosis not present

## 2022-04-05 MED ORDER — AZITHROMYCIN 250 MG PO TABS: ORAL_TABLET | ORAL | 0 refills | Status: DC

## 2022-04-06 ENCOUNTER — Ambulatory Visit: Payer: Medicare Other | Admitting: Vascular Surgery

## 2022-04-06 ENCOUNTER — Other Ambulatory Visit (HOSPITAL_COMMUNITY): Payer: Self-pay | Admitting: *Deleted

## 2022-04-06 NOTE — Progress Notes (Signed)
Surgical Instructions    Your procedure is scheduled on July 11th Tuesday.  Report to [facility] Main Entrance "A" at 5:30 A.M., then check in with the Admitting office.  Call this number if you have problems the morning of surgery:  [phone]   If you have any questions prior to your surgery date call [phone]: Open Monday-Friday 8am-4pm    Remember:  Do not eat or drink after midnight the night before your surgery    Take these medicines the morning of surgery with A SIP OF WATER:  Gabapentin Percocet if needed Tramadol if needed  Follow your surgeon's instructions on when to stop Aspirin.  If no instructions were given by your surgeon then you will need to call the office to get those instructions.    As of today, STOP taking any Aspirin (unless otherwise instructed by your surgeon) Aleve, Naproxen, Ibuprofen, Motrin, Advil, Goody's, BC's, all herbal medications, fish oil, and all vitamins.           Do not wear jewelry  Do not wear lotions, powders, colognes, or deodorant. Do not shave 48 hours prior to surgery.  Men may shave face and neck. Do not bring valuables to the hospital. Do not wear nail polish  [place] is not responsible for any belongings or valuables. .   Do NOT Smoke (Tobacco/Vaping)  24 hours prior to your procedure  If you use a CPAP at night, you may bring your mask for your overnight stay.   Contacts, glasses, hearing aids, dentures or partials may not be worn into surgery, please bring cases for these belongings   For patients admitted to the hospital, discharge time will be determined by your treatment team.   Patients discharged the day of surgery will not be allowed to drive home, and someone needs to stay with them for 24 hours.   SURGICAL WAITING ROOM VISITATION Patients having surgery or a procedure in a hospital may have two support people. Children under the age of 43 must have an adult with them who is not the patient. They may  stay in the waiting area during the procedure and may switch out with other visitors. If the patient needs to stay at the hospital during part of their recovery, the visitor guidelines for inpatient rooms apply.  Please refer to the [facility] website for the visitor guidelines for Inpatients (after your surgery is over and you are in a regular room).       Special instructions:    Oral Hygiene is also important to reduce your risk of infection.  Remember - BRUSH YOUR TEETH THE MORNING OF SURGERY WITH YOUR REGULAR TOOTHPASTE   [place]- Preparing For Surgery  Before surgery, you can play an important role. Because skin is not sterile, your skin needs to be as free of germs as possible. You can reduce the number of germs on your skin by washing with CHG (chlorahexidine gluconate) Soap before surgery.  CHG is an antiseptic cleaner which kills germs and bonds with the skin to continue killing germs even after washing.     Please do not use if you have an allergy to CHG or antibacterial soaps. If your skin becomes reddened/irritated stop using the CHG.  Do not shave (including legs and underarms) for at least 48 hours prior to first CHG shower. It is OK to shave your face.  Please follow these instructions carefully.     Shower the NIGHT BEFORE SURGERY and the MORNING OF SURGERY  with CHG Soap.   If you chose to wash your hair, wash your hair first as usual with your normal shampoo. After you shampoo, rinse your hair and body thoroughly to remove the shampoo.  Then [employer] and genitals (private parts) with your normal soap and rinse thoroughly to remove soap.  After that Use CHG Soap as you would any other liquid soap. You can apply CHG directly to the skin and wash gently with a scrungie or a clean washcloth.   Apply the CHG Soap to your body ONLY FROM THE NECK DOWN.  Do not use on open wounds or open sores. Avoid contact with your eyes, ears, mouth and genitals (private parts). Wash  Face and genitals (private parts)  with your normal soap.   Wash thoroughly, paying special attention to the area where your surgery will be performed.  Thoroughly rinse your body with warm water from the neck down.  DO NOT shower/wash with your normal soap after using and rinsing off the CHG Soap.  Pat yourself dry with a CLEAN TOWEL.  Wear CLEAN PAJAMAS to bed the night before surgery  Place CLEAN SHEETS on your bed the night before your surgery  DO NOT SLEEP WITH PETS.   Day of Surgery:  Take a shower with CHG soap. Wear Clean/Comfortable clothing the morning of surgery Do not apply any deodorants/lotions.   Remember to brush your teeth WITH YOUR REGULAR TOOTHPASTE.    If you received a COVID test during your pre-op visit, it is requested that you wear a mask when out in public, stay away from anyone that may not be feeling well, and notify your surgeon if you develop symptoms. If you have been in contact with anyone that has tested positive in the last 10 days, please notify your surgeon.    Please read over the following fact sheets that you were given.

## 2022-04-07 ENCOUNTER — Other Ambulatory Visit (HOSPITAL_COMMUNITY): Payer: Self-pay | Admitting: Urology

## 2022-04-07 ENCOUNTER — Encounter (HOSPITAL_COMMUNITY): Payer: Self-pay

## 2022-04-07 ENCOUNTER — Encounter (HOSPITAL_COMMUNITY)
Admission: RE | Admit: 2022-04-07 | Discharge: 2022-04-07 | Disposition: A | Discharge status: Home / Self Care | Payer: Medicare Other | Source: Ambulatory Visit | Attending: Vascular Surgery | Admitting: Vascular Surgery

## 2022-04-07 ENCOUNTER — Other Ambulatory Visit: Payer: Self-pay

## 2022-04-07 ENCOUNTER — Inpatient Hospital Stay (HOSPITAL_COMMUNITY): Admission: RE | Admit: 2022-04-07 | Payer: Medicare Other | Source: Ambulatory Visit | Admitting: Neurological Surgery

## 2022-04-07 VITALS — BP 127/89 | HR 56 | Temp 97.6°F | Resp 18 | Ht 72.0 in | Wt 202.5 lb

## 2022-04-07 DIAGNOSIS — Z01818 Encounter for other preprocedural examination: Secondary | ICD-10-CM

## 2022-04-07 DIAGNOSIS — I6522 Occlusion and stenosis of left carotid artery: Secondary | ICD-10-CM | POA: Insufficient documentation

## 2022-04-07 DIAGNOSIS — Z01812 Encounter for preprocedural laboratory examination: Secondary | ICD-10-CM | POA: Diagnosis not present

## 2022-04-07 HISTORY — DX: Pneumonia, unspecified organism: J18.9

## 2022-04-07 LAB — COMPREHENSIVE METABOLIC PANEL
ALT: 15 U/L (ref 0–44)
AST: 19 U/L (ref 15–41)
Albumin: 3.5 g/dL (ref 3.5–5.0)
Alkaline Phosphatase: 88 U/L (ref 38–126)
Anion gap: 8 (ref 5–15)
BUN: 30 mg/dL — ABNORMAL HIGH (ref 8–23)
CO2: 26 mmol/L (ref 22–32)
Calcium: 9.5 mg/dL (ref 8.9–10.3)
Chloride: 106 mmol/L (ref 98–111)
Creatinine, Ser: 0.96 mg/dL (ref 0.61–1.24)
GFR, Estimated: >60 mL/min (ref >60)
Glucose, Bld: 101 mg/dL — ABNORMAL HIGH (ref 70–99)
Potassium: 4.3 mmol/L (ref 3.5–5.1)
Sodium: 140 mmol/L (ref 135–145)
Total Bilirubin: 0.5 mg/dL (ref 0.3–1.2)
Total Protein: 6.7 g/dL (ref 6.5–8.1)

## 2022-04-07 LAB — TYPE AND SCREEN
ABO/RH(D): O POS
Antibody Screen: NEGATIVE

## 2022-04-07 LAB — CBC
HCT: 47.7 % (ref 39.0–52.0)
Hemoglobin: 16.4 g/dL (ref 13.0–17.0)
MCH: 30.7 pg (ref 26.0–34.0)
MCHC: 34.4 g/dL (ref 30.0–36.0)
MCV: 89.3 fL (ref 80.0–100.0)
Platelets: 342 10*3/uL (ref 150–400)
RBC: 5.34 MIL/uL (ref 4.22–5.81)
RDW: 15.9 % — ABNORMAL HIGH (ref 11.5–15.5)
WBC: 9 10*3/uL (ref 4.0–10.5)
nRBC: 0 % (ref 0.0–0.2)

## 2022-04-07 LAB — URINALYSIS, ROUTINE W REFLEX MICROSCOPIC
Bilirubin Urine: NEGATIVE
Glucose, UA: NEGATIVE mg/dL
Hgb urine dipstick: NEGATIVE
Ketones, ur: NEGATIVE mg/dL
Leukocytes,Ua: NEGATIVE
Nitrite: NEGATIVE
Protein, ur: NEGATIVE mg/dL
Specific Gravity, Urine: 1.024 (ref 1.005–1.030)
pH: 5 (ref 5.0–8.0)

## 2022-04-07 LAB — PROTIME-INR
INR: 1 (ref 0.8–1.2)
Prothrombin Time: 13.5 seconds (ref 11.4–15.2)

## 2022-04-07 LAB — SURGICAL PCR SCREEN
MRSA, PCR: NEGATIVE
Staphylococcus aureus: NEGATIVE

## 2022-04-07 LAB — APTT: aPTT: 32 seconds (ref 24–36)

## 2022-04-07 SURGERY — MINIMALLY INVASIVE (MIS) TRANSFORAMINAL LUMBAR INTERBODY FUSION (TLIF) 2 LEVEL
Anesthesia: General

## 2022-04-07 NOTE — Progress Notes (Signed)
Surgical Instructions    Your procedure is scheduled on July 11th Tuesday.  Report to [facility] Main Entrance "A" at 5:30 A.M., then check in with the Admitting office.  Call this number if you have problems the morning of surgery:  [phone]   If you have any questions prior to your surgery date call [phone]: Open Monday-Friday 8am-4pm    Remember:  Do not eat or drink after midnight the night before your surgery    Take these medicines the morning of surgery with A SIP OF WATER:  Gabapentin Percocet if needed Tramadol if needed  Follow your surgeon's instructions on whether to stop or continue taking Aspirin.  If no instructions were given by your surgeon then you will need to call the office to get those instructions.    7 days prior to surgery, STOP taking any diclofenac (VOLTAREN), Aleve, Naproxen, Ibuprofen, Motrin, Advil, Goody's, BC's, all herbal medications, fish oil, and all vitamins.            [place] is not responsible for any belongings or valuables. .   Do NOT Smoke (Tobacco/Vaping)  24 hours prior to your procedure  If you use a CPAP at night, you may bring your mask for your overnight stay.   Contacts, glasses, hearing aids, dentures or partials may not be worn into surgery, please bring cases for these belongings   For patients admitted to the hospital, discharge time will be determined by your treatment team.   Patients discharged the day of surgery will not be allowed to drive home, and someone needs to stay with them for 24 hours.   SURGICAL WAITING ROOM VISITATION Patients having surgery or a procedure in a hospital may have two support people. Children under the age of 84 must have an adult with them who is not the patient. They may stay in the waiting area during the procedure and may switch out with other visitors. If the patient needs to stay at the hospital during part of their recovery, the visitor guidelines for inpatient rooms  apply.  Please refer to the [facility] website for the visitor guidelines for Inpatients (after your surgery is over and you are in a regular room).       Special instructions:    Oral Hygiene is also important to reduce your risk of infection.  Remember - BRUSH YOUR TEETH THE MORNING OF SURGERY WITH YOUR REGULAR TOOTHPASTE   [place]- Preparing For Surgery  Before surgery, you can play an important role. Because skin is not sterile, your skin needs to be as free of germs as possible. You can reduce the number of germs on your skin by washing with CHG (chlorahexidine gluconate) Soap before surgery.  CHG is an antiseptic cleaner which kills germs and bonds with the skin to continue killing germs even after washing.     Please do not use if you have an allergy to CHG or antibacterial soaps. If your skin becomes reddened/irritated stop using the CHG.  Do not shave (including legs and underarms) for at least 48 hours prior to first CHG shower. It is OK to shave your face.  Please follow these instructions carefully.     Shower the NIGHT BEFORE SURGERY and the MORNING OF SURGERY with CHG Soap.   If you chose to wash your hair, wash your hair first as usual with your normal shampoo. After you shampoo, rinse your hair and body thoroughly to remove the shampoo.  Then [employer] and  genitals (private parts) with your normal soap and rinse thoroughly to remove soap.  After that Use CHG Soap as you would any other liquid soap. You can apply CHG directly to the skin and wash gently with a scrungie or a clean washcloth.   Apply the CHG Soap to your body ONLY FROM THE NECK DOWN.  Do not use on open wounds or open sores. Avoid contact with your eyes, ears, mouth and genitals (private parts). Wash Face and genitals (private parts)  with your normal soap.   Wash thoroughly, paying special attention to the area where your surgery will be performed.  Thoroughly rinse your body with warm water from  the neck down.  DO NOT shower/wash with your normal soap after using and rinsing off the CHG Soap.  Pat yourself dry with a CLEAN TOWEL.  Wear CLEAN PAJAMAS to bed the night before surgery  Place CLEAN SHEETS on your bed the night before your surgery  DO NOT SLEEP WITH PETS.   Day of Surgery:  Take a shower with CHG soap. Wear Clean/Comfortable clothing the morning of surgery Do not wear jewelry  Do not wear lotions, powders, colognes, or deodorant. Do not shave 48 hours prior to surgery.  Men may shave face and neck. Do not bring valuables to the hospital. Do not wear nail polish Remember to brush your teeth WITH YOUR REGULAR TOOTHPASTE.    If you received a COVID test during your pre-op visit, it is requested that you wear a mask when out in public, stay away from anyone that may not be feeling well, and notify your surgeon if you develop symptoms. If you have been in contact with anyone that has tested positive in the last 10 days, please notify your surgeon.    Please read over the following fact sheets that you were given.

## 2022-04-07 NOTE — Progress Notes (Signed)
PCP -   Glenda Chroman, MD   Cardiologist - Phineas Inches, MD  PPM/ICD - denies  Chest x-ray - N/A Chest CT- 03/25/22 EKG - 03/28/22 Stress Test - denies ECHO - denies Cardiac Cath - denies   Aspirin Instructions: Follow your surgeon's instructions on when to stop Aspirin.  If no instructions were given by your surgeon then you will need to call the office to get those instructions.  - Per vascular note pt to continue ASA   ERAS Protcol -NPO order   COVID TEST- N/A   Anesthesia review: no  Patient denies shortness of breath, fever, cough and chest pain at PAT appointment   All instructions explained to the patient, with a verbal understanding of the material. Patient agrees to go over the instructions while at home for a better understanding. Patient also instructed to self quarantine after being tested for COVID-19. The opportunity to ask questions was provided.

## 2022-04-10 NOTE — Progress Notes (Signed)
Ty, fyi results were reviewed already in last office visit with SG.   Thanks,  BLI  Garner Nash, DO [facility] 04/10/2022 1:30 PM

## 2022-04-18 NOTE — Anesthesia Preprocedure Evaluation (Signed)
Anesthesia Evaluation  Patient identified by MRN, date of birth, ID band Patient awake    Reviewed: Allergy & Precautions, NPO status , Patient's Chart, lab work & pertinent test results  Airway Mallampati: II  TM Distance: >3 FB Neck ROM: Full    Dental no notable dental hx. (+) Teeth Intact, Dental Advisory Given   Pulmonary COPD, Current Smoker and Patient abstained from smoking.,    Pulmonary exam normal breath sounds clear to auscultation       Cardiovascular hypertension, Pt. on medications Normal cardiovascular exam Rhythm:Regular Rate:Normal     Neuro/Psych negative neurological ROS  negative psych ROS   GI/Hepatic Neg liver ROS, GERD  ,  Endo/Other  negative endocrine ROS  Renal/GU negative Renal ROS  negative genitourinary   Musculoskeletal  (+) Arthritis ,   Abdominal   Peds  Hematology negative hematology ROS (+)   Anesthesia Other Findings   Reproductive/Obstetrics                            Anesthesia Physical Anesthesia Plan  ASA: 2  Anesthesia Plan: General   Post-op Pain Management: Tylenol PO (pre-op)*   Induction: Intravenous  PONV Risk Score and Plan: 1 and Midazolam, Dexamethasone and Ondansetron  Airway Management Planned: Oral ETT  Additional Equipment: Arterial line  Intra-op Plan:   Post-operative Plan: Extubation in OR  Informed Consent: I have reviewed the patients History and Physical, chart, labs and discussed the procedure including the risks, benefits and alternatives for the proposed anesthesia with the patient or authorized representative who has indicated his/her understanding and acceptance.     Dental advisory given  Plan Discussed with: CRNA  Anesthesia Plan Comments: (2 IVs)        Anesthesia Quick Evaluation

## 2022-04-19 ENCOUNTER — Inpatient Hospital Stay (HOSPITAL_COMMUNITY)
Admission: RE | Admit: 2022-04-19 | Discharge: 2022-04-20 | DRG: 039 | Disposition: A | Discharge status: Home / Self Care | Payer: Medicare Other | Attending: Vascular Surgery | Admitting: Vascular Surgery

## 2022-04-19 ENCOUNTER — Inpatient Hospital Stay (HOSPITAL_COMMUNITY): Payer: Medicare Other | Admitting: Certified Registered Nurse Anesthetist

## 2022-04-19 ENCOUNTER — Encounter (HOSPITAL_COMMUNITY)
Admission: RE | Disposition: A | Discharge status: Home / Self Care | Payer: Self-pay | Source: Home / Self Care | Attending: Vascular Surgery

## 2022-04-19 ENCOUNTER — Other Ambulatory Visit: Payer: Self-pay

## 2022-04-19 ENCOUNTER — Encounter (HOSPITAL_COMMUNITY): Payer: Self-pay | Admitting: Vascular Surgery

## 2022-04-19 DIAGNOSIS — Z8249 Family history of ischemic heart disease and other diseases of the circulatory system: Secondary | ICD-10-CM | POA: Diagnosis not present

## 2022-04-19 DIAGNOSIS — F17210 Nicotine dependence, cigarettes, uncomplicated: Secondary | ICD-10-CM | POA: Diagnosis not present

## 2022-04-19 DIAGNOSIS — J449 Chronic obstructive pulmonary disease, unspecified: Secondary | ICD-10-CM | POA: Diagnosis present

## 2022-04-19 DIAGNOSIS — H269 Unspecified cataract: Secondary | ICD-10-CM | POA: Diagnosis not present

## 2022-04-19 DIAGNOSIS — I10 Essential (primary) hypertension: Secondary | ICD-10-CM | POA: Diagnosis not present

## 2022-04-19 DIAGNOSIS — I6522 Occlusion and stenosis of left carotid artery: Secondary | ICD-10-CM

## 2022-04-19 DIAGNOSIS — K219 Gastro-esophageal reflux disease without esophagitis: Secondary | ICD-10-CM | POA: Diagnosis not present

## 2022-04-19 DIAGNOSIS — H538 Other visual disturbances: Secondary | ICD-10-CM | POA: Diagnosis not present

## 2022-04-19 DIAGNOSIS — Z7982 Long term (current) use of aspirin: Secondary | ICD-10-CM

## 2022-04-19 DIAGNOSIS — Z79899 Other long term (current) drug therapy: Secondary | ICD-10-CM

## 2022-04-19 DIAGNOSIS — Z87442 Personal history of urinary calculi: Secondary | ICD-10-CM | POA: Diagnosis not present

## 2022-04-19 DIAGNOSIS — I6529 Occlusion and stenosis of unspecified carotid artery: Principal | ICD-10-CM | POA: Diagnosis present

## 2022-04-19 HISTORY — PX: PATCH ANGIOPLASTY: SHX6230

## 2022-04-19 HISTORY — PX: ENDARTERECTOMY: SHX5162

## 2022-04-19 LAB — POCT ACTIVATED CLOTTING TIME: Activated Clotting Time: 269 seconds

## 2022-04-19 LAB — ABO/RH: ABO/RH(D): O POS

## 2022-04-19 SURGERY — ENDARTERECTOMY, CAROTID
Anesthesia: General | Site: Neck | Laterality: Left

## 2022-04-19 MED ORDER — HYDRALAZINE HCL 20 MG/ML IJ SOLN: 5.0000 mg | INTRAMUSCULAR | Status: DC | PRN

## 2022-04-19 MED ORDER — CEFAZOLIN SODIUM 1 G IJ SOLR
INTRAMUSCULAR | Status: AC
  Filled 2022-04-19: qty 20

## 2022-04-19 MED ORDER — PROPOFOL 10 MG/ML IV BOLUS
INTRAVENOUS | Status: AC
Start: 2022-04-19 — End: ?
  Filled 2022-04-19: qty 20

## 2022-04-19 MED ORDER — LACTATED RINGERS IV SOLN: INTRAVENOUS | Status: DC | PRN

## 2022-04-19 MED ORDER — HEPARIN SODIUM (PORCINE) 1000 UNIT/ML IJ SOLN
INTRAMUSCULAR | Status: DC | PRN
  Administered 2022-04-19: 10000 [IU] via INTRAVENOUS

## 2022-04-19 MED ORDER — GLYCOPYRROLATE 0.2 MG/ML IJ SOLN
INTRAMUSCULAR | Status: DC | PRN
  Administered 2022-04-19: .2 mg via INTRAVENOUS

## 2022-04-19 MED ORDER — PROPOFOL 10 MG/ML IV BOLUS
INTRAVENOUS | Status: DC | PRN
  Administered 2022-04-19: 20 mg via INTRAVENOUS
  Administered 2022-04-19: 120 mg via INTRAVENOUS

## 2022-04-19 MED ORDER — PANTOPRAZOLE SODIUM 40 MG PO TBEC
40.0000 mg | DELAYED_RELEASE_TABLET | Freq: Every day | ORAL | Status: DC
  Administered 2022-04-19 – 2022-04-20 (×2): 40 mg via ORAL
  Filled 2022-04-19 (×2): qty 1

## 2022-04-19 MED ORDER — ACETAMINOPHEN 500 MG PO TABS
ORAL_TABLET | ORAL | Status: AC
  Administered 2022-04-19: 1000 mg via ORAL
  Filled 2022-04-19: qty 2

## 2022-04-19 MED ORDER — ACETAMINOPHEN 325 MG PO TABS: 325.0000 mg | ORAL_TABLET | ORAL | Status: DC | PRN

## 2022-04-19 MED ORDER — OXYCODONE-ACETAMINOPHEN 5-325 MG PO TABS
ORAL_TABLET | ORAL | Status: AC
  Filled 2022-04-19: qty 1

## 2022-04-19 MED ORDER — LACTATED RINGERS IV SOLN: INTRAVENOUS | Status: DC

## 2022-04-19 MED ORDER — PHENYLEPHRINE HCL-NACL 20-0.9 MG/250ML-% IV SOLN
INTRAVENOUS | Status: DC | PRN
  Administered 2022-04-19: 50 ug/min via INTRAVENOUS
  Administered 2022-04-19: 100 ug/min via INTRAVENOUS

## 2022-04-19 MED ORDER — LIDOCAINE 2% (20 MG/ML) 5 ML SYRINGE
INTRAMUSCULAR | Status: DC | PRN
  Administered 2022-04-19: 80 mg via INTRAVENOUS

## 2022-04-19 MED ORDER — ASPIRIN 81 MG PO TBEC
81.0000 mg | DELAYED_RELEASE_TABLET | Freq: Every day | ORAL | Status: DC
  Administered 2022-04-20: 81 mg via ORAL
  Filled 2022-04-19: qty 1

## 2022-04-19 MED ORDER — SODIUM CHLORIDE 0.9 % IV SOLN: INTRAVENOUS | Status: DC

## 2022-04-19 MED ORDER — DEXAMETHASONE SODIUM PHOSPHATE 10 MG/ML IJ SOLN
INTRAMUSCULAR | Status: AC
  Filled 2022-04-19: qty 1

## 2022-04-19 MED ORDER — CEFAZOLIN SODIUM-DEXTROSE 2-4 GM/100ML-% IV SOLN
2.0000 g | INTRAVENOUS | Status: AC
  Administered 2022-04-19: 2 g via INTRAVENOUS

## 2022-04-19 MED ORDER — HEPARIN 6000 UNIT IRRIGATION SOLUTION
Status: DC | PRN
  Administered 2022-04-19: 1

## 2022-04-19 MED ORDER — OXYCODONE-ACETAMINOPHEN 5-325 MG PO TABS
1.0000 | ORAL_TABLET | ORAL | Status: DC | PRN
  Administered 2022-04-19: 1 via ORAL

## 2022-04-19 MED ORDER — PHENYLEPHRINE 80 MCG/ML (10ML) SYRINGE FOR IV PUSH (FOR BLOOD PRESSURE SUPPORT)
PREFILLED_SYRINGE | INTRAVENOUS | Status: DC | PRN
  Administered 2022-04-19 (×2): 160 ug via INTRAVENOUS

## 2022-04-19 MED ORDER — LIDOCAINE HCL (PF) 1 % IJ SOLN
INTRAMUSCULAR | Status: AC
  Filled 2022-04-19: qty 30

## 2022-04-19 MED ORDER — CHLORHEXIDINE GLUCONATE 0.12 % MT SOLN
OROMUCOSAL | Status: AC
  Administered 2022-04-19: 15 mL via OROMUCOSAL
  Filled 2022-04-19: qty 15

## 2022-04-19 MED ORDER — ONDANSETRON HCL 4 MG/2ML IJ SOLN: 4.0000 mg | Freq: Four times a day (QID) | INTRAMUSCULAR | Status: DC | PRN

## 2022-04-19 MED ORDER — MIDAZOLAM HCL 2 MG/2ML IJ SOLN
INTRAMUSCULAR | Status: AC
  Filled 2022-04-19: qty 2

## 2022-04-19 MED ORDER — ALUM & MAG HYDROXIDE-SIMETH 200-200-20 MG/5ML PO SUSP: 15.0000 mL | ORAL | Status: DC | PRN

## 2022-04-19 MED ORDER — GUAIFENESIN-DM 100-10 MG/5ML PO SYRP: 15.0000 mL | ORAL_SOLUTION | ORAL | Status: DC | PRN

## 2022-04-19 MED ORDER — LIDOCAINE 2% (20 MG/ML) 5 ML SYRINGE
INTRAMUSCULAR | Status: AC
Start: 2022-04-19 — End: ?
  Filled 2022-04-19: qty 5

## 2022-04-19 MED ORDER — ONDANSETRON HCL 4 MG/2ML IJ SOLN
INTRAMUSCULAR | Status: AC
  Filled 2022-04-19: qty 2

## 2022-04-19 MED ORDER — METOPROLOL TARTRATE 5 MG/5ML IV SOLN: 2.0000 mg | INTRAVENOUS | Status: DC | PRN

## 2022-04-19 MED ORDER — PROTAMINE SULFATE 10 MG/ML IV SOLN
INTRAVENOUS | Status: AC
  Filled 2022-04-19: qty 25

## 2022-04-19 MED ORDER — CHLORHEXIDINE GLUCONATE 0.12 % MT SOLN: 15.0000 mL | Freq: Once | OROMUCOSAL | Status: AC

## 2022-04-19 MED ORDER — LABETALOL HCL 5 MG/ML IV SOLN: 10.0000 mg | INTRAVENOUS | Status: DC | PRN

## 2022-04-19 MED ORDER — CEFAZOLIN SODIUM-DEXTROSE 2-4 GM/100ML-% IV SOLN
INTRAVENOUS | Status: AC
  Filled 2022-04-19: qty 100

## 2022-04-19 MED ORDER — PHENOL 1.4 % MT LIQD: 1.0000 | OROMUCOSAL | Status: DC | PRN

## 2022-04-19 MED ORDER — MORPHINE SULFATE (PF) 2 MG/ML IV SOLN: 2.0000 mg | INTRAVENOUS | Status: DC | PRN

## 2022-04-19 MED ORDER — ORAL CARE MOUTH RINSE: 15.0000 mL | Freq: Once | OROMUCOSAL | Status: AC

## 2022-04-19 MED ORDER — HEMOSTATIC AGENTS (NO CHARGE) OPTIME
TOPICAL | Status: DC | PRN
  Administered 2022-04-19: 1 via TOPICAL

## 2022-04-19 MED ORDER — ACETAMINOPHEN 650 MG RE SUPP: 325.0000 mg | RECTAL | Status: DC | PRN

## 2022-04-19 MED ORDER — MAGNESIUM SULFATE 2 GM/50ML IV SOLN: 2.0000 g | Freq: Every day | INTRAVENOUS | Status: DC | PRN

## 2022-04-19 MED ORDER — PROTAMINE SULFATE 10 MG/ML IV SOLN
INTRAVENOUS | Status: DC | PRN
  Administered 2022-04-19: 10 mg via INTRAVENOUS
  Administered 2022-04-19: 20 mg via INTRAVENOUS
  Administered 2022-04-19 (×2): 10 mg via INTRAVENOUS

## 2022-04-19 MED ORDER — FENTANYL CITRATE (PF) 250 MCG/5ML IJ SOLN
INTRAMUSCULAR | Status: AC
  Filled 2022-04-19: qty 5

## 2022-04-19 MED ORDER — ATORVASTATIN CALCIUM 10 MG PO TABS
20.0000 mg | ORAL_TABLET | Freq: Every day | ORAL | Status: DC
  Administered 2022-04-19 – 2022-04-20 (×2): 20 mg via ORAL
  Filled 2022-04-19 (×2): qty 2

## 2022-04-19 MED ORDER — FENTANYL CITRATE (PF) 100 MCG/2ML IJ SOLN: 25.0000 ug | INTRAMUSCULAR | Status: DC | PRN

## 2022-04-19 MED ORDER — 0.9 % SODIUM CHLORIDE (POUR BTL) OPTIME
TOPICAL | Status: DC | PRN
  Administered 2022-04-19 (×2): 1000 mL

## 2022-04-19 MED ORDER — SODIUM CHLORIDE 0.9 % IV SOLN
0.0125 ug/kg/min | INTRAVENOUS | Status: AC
  Administered 2022-04-19: .2 ug/kg/min via INTRAVENOUS
  Filled 2022-04-19: qty 2000

## 2022-04-19 MED ORDER — ROCURONIUM BROMIDE 10 MG/ML (PF) SYRINGE
PREFILLED_SYRINGE | INTRAVENOUS | Status: AC
Start: 2022-04-19 — End: ?
  Filled 2022-04-19: qty 10

## 2022-04-19 MED ORDER — ACETAMINOPHEN 500 MG PO TABS: 1000.0000 mg | ORAL_TABLET | Freq: Once | ORAL | Status: AC

## 2022-04-19 MED ORDER — HEPARIN 6000 UNIT IRRIGATION SOLUTION
Status: AC
  Filled 2022-04-19: qty 500

## 2022-04-19 MED ORDER — CEFAZOLIN SODIUM-DEXTROSE 2-4 GM/100ML-% IV SOLN
2.0000 g | Freq: Three times a day (TID) | INTRAVENOUS | Status: AC
  Administered 2022-04-19 – 2022-04-20 (×2): 2 g via INTRAVENOUS
  Filled 2022-04-19 (×2): qty 100

## 2022-04-19 MED ORDER — ONDANSETRON HCL 4 MG/2ML IJ SOLN
INTRAMUSCULAR | Status: DC | PRN
  Administered 2022-04-19: 4 mg via INTRAVENOUS

## 2022-04-19 MED ORDER — DEXTRAN 40 IN SALINE 10-0.9 % IV SOLN
INTRAVENOUS | Status: AC | PRN
  Administered 2022-04-19: 500 mL

## 2022-04-19 MED ORDER — CHLORHEXIDINE GLUCONATE CLOTH 2 % EX PADS: 6.0000 | MEDICATED_PAD | Freq: Once | CUTANEOUS | Status: DC

## 2022-04-19 MED ORDER — MIDAZOLAM HCL 2 MG/2ML IJ SOLN
INTRAMUSCULAR | Status: DC | PRN
  Administered 2022-04-19: 2 mg via INTRAVENOUS

## 2022-04-19 MED ORDER — GABAPENTIN 600 MG PO TABS
600.0000 mg | ORAL_TABLET | Freq: Three times a day (TID) | ORAL | Status: DC
  Administered 2022-04-19 – 2022-04-20 (×3): 600 mg via ORAL
  Filled 2022-04-19 (×3): qty 1

## 2022-04-19 MED ORDER — LISINOPRIL 20 MG PO TABS
20.0000 mg | ORAL_TABLET | Freq: Every day | ORAL | Status: DC
  Administered 2022-04-20: 20 mg via ORAL
  Filled 2022-04-19: qty 1

## 2022-04-19 MED ORDER — POTASSIUM CHLORIDE CRYS ER 20 MEQ PO TBCR: 20.0000 meq | EXTENDED_RELEASE_TABLET | Freq: Every day | ORAL | Status: DC | PRN

## 2022-04-19 MED ORDER — DEXAMETHASONE SODIUM PHOSPHATE 10 MG/ML IJ SOLN
INTRAMUSCULAR | Status: DC | PRN
  Administered 2022-04-19: 10 mg via INTRAVENOUS

## 2022-04-19 MED ORDER — SODIUM CHLORIDE 0.9 % IV SOLN: 500.0000 mL | Freq: Once | INTRAVENOUS | Status: DC | PRN

## 2022-04-19 MED ORDER — ROCURONIUM BROMIDE 10 MG/ML (PF) SYRINGE
PREFILLED_SYRINGE | INTRAVENOUS | Status: DC | PRN
  Administered 2022-04-19: 100 mg via INTRAVENOUS

## 2022-04-19 MED ORDER — SUGAMMADEX SODIUM 200 MG/2ML IV SOLN
INTRAVENOUS | Status: DC | PRN
  Administered 2022-04-19: 200 mg via INTRAVENOUS

## 2022-04-19 MED ORDER — DOCUSATE SODIUM 100 MG PO CAPS
100.0000 mg | ORAL_CAPSULE | Freq: Every day | ORAL | Status: DC
  Administered 2022-04-20: 100 mg via ORAL
  Filled 2022-04-19: qty 1

## 2022-04-19 SURGICAL SUPPLY — 55 items
ADH SKN CLS APL DERMABOND .7 (GAUZE/BANDAGES/DRESSINGS) ×2
ADPR TBG 2 MALE LL ART (MISCELLANEOUS)
BAG COUNTER SPONGE SURGICOUNT (BAG) ×4 IMPLANT
BAG DECANTER FOR FLEXI CONT (MISCELLANEOUS) ×4 IMPLANT
BAG SPNG CNTER NS LX DISP (BAG) ×2
BLANKET WARM CARDIAC ADLT BAIR (MISCELLANEOUS) ×1 IMPLANT
CANISTER SUCT 3000ML PPV (MISCELLANEOUS) ×4 IMPLANT
CANNULA VESSEL 3MM 2 BLNT TIP (CANNULA) ×2 IMPLANT
CATH ROBINSON RED A/P 18FR (CATHETERS) ×4 IMPLANT
CLIP LIGATING EXTRA MED SLVR (CLIP) ×4 IMPLANT
CLIP LIGATING EXTRA SM BLUE (MISCELLANEOUS) ×4 IMPLANT
COVER PROBE W GEL 5X96 (DRAPES) ×4 IMPLANT
DERMABOND ADVANCED (GAUZE/BANDAGES/DRESSINGS) ×1
DERMABOND ADVANCED .7 DNX12 (GAUZE/BANDAGES/DRESSINGS) ×3 IMPLANT
DRAIN CHANNEL 15F RND FF W/TCR (WOUND CARE) IMPLANT
ELECT REM PT RETURN 9FT ADLT (ELECTROSURGICAL) ×3
ELECTRODE REM PT RTRN 9FT ADLT (ELECTROSURGICAL) ×3 IMPLANT
EVACUATOR SILICONE 100CC (DRAIN) IMPLANT
GLOVE BIO SURGEON STRL SZ7.5 (GLOVE) ×4 IMPLANT
GLOVE BIOGEL PI IND STRL 6.5 (GLOVE) IMPLANT
GLOVE BIOGEL PI INDICATOR 6.5 (GLOVE) ×2
GOWN STRL REUS W/ TWL LRG LVL3 (GOWN DISPOSABLE) ×6 IMPLANT
GOWN STRL REUS W/ TWL XL LVL3 (GOWN DISPOSABLE) ×3 IMPLANT
GOWN STRL REUS W/TWL LRG LVL3 (GOWN DISPOSABLE) ×9
GOWN STRL REUS W/TWL XL LVL3 (GOWN DISPOSABLE) ×3
HEMOSTAT SNOW SURGICEL 2X4 (HEMOSTASIS) IMPLANT
INSERT FOGARTY SM (MISCELLANEOUS) IMPLANT
IV ADAPTER SYR DOUBLE MALE LL (MISCELLANEOUS) IMPLANT
KIT BASIN OR (CUSTOM PROCEDURE TRAY) ×4 IMPLANT
KIT SHUNT ARGYLE CAROTID ART 6 (VASCULAR PRODUCTS) ×4 IMPLANT
KIT TURNOVER KIT B (KITS) ×4 IMPLANT
NDL HYPO 25GX1X1/2 BEV (NEEDLE) IMPLANT
NDL SPNL 20GX3.5 QUINCKE YW (NEEDLE) IMPLANT
NEEDLE HYPO 25GX1X1/2 BEV (NEEDLE) IMPLANT
NEEDLE SPNL 20GX3.5 QUINCKE YW (NEEDLE) IMPLANT
NS IRRIG 1000ML POUR BTL (IV SOLUTION) ×12 IMPLANT
PACK CAROTID (CUSTOM PROCEDURE TRAY) ×4 IMPLANT
PAD ARMBOARD 7.5X6 YLW CONV (MISCELLANEOUS) ×8 IMPLANT
PATCH VASC XENOSURE 1CMX6CM (Vascular Products) ×3 IMPLANT
PATCH VASC XENOSURE 1X6 (Vascular Products) IMPLANT
POSITIONER HEAD DONUT 9IN (MISCELLANEOUS) ×4 IMPLANT
POWDER SURGICEL 3.0 GRAM (HEMOSTASIS) ×1 IMPLANT
STOPCOCK 4 WAY LG BORE MALE ST (IV SETS) IMPLANT
SUT ETHILON 3 0 PS 1 (SUTURE) IMPLANT
SUT MNCRL AB 4-0 PS2 18 (SUTURE) ×4 IMPLANT
SUT PROLENE 6 0 BV (SUTURE) ×5 IMPLANT
SUT SILK 3 0 (SUTURE)
SUT SILK 3-0 18XBRD TIE 12 (SUTURE) IMPLANT
SUT VIC AB 3-0 SH 27 (SUTURE) ×3
SUT VIC AB 3-0 SH 27X BRD (SUTURE) ×3 IMPLANT
SYR 20ML LL LF (SYRINGE) ×2 IMPLANT
SYR CONTROL 10ML LL (SYRINGE) IMPLANT
TOWEL GREEN STERILE (TOWEL DISPOSABLE) ×4 IMPLANT
TUBING ART PRESS 48 MALE/FEM (TUBING) IMPLANT
WATER STERILE IRR 1000ML POUR (IV SOLUTION) ×4 IMPLANT

## 2022-04-19 NOTE — Interval H&P Note (Signed)
History and Physical Interval Note:  04/19/2022 7:24 AM  Leon Mitchell  has presented today for surgery, with the diagnosis of Left carotid artery stenosis.  The various methods of treatment have been discussed with the patient and family. After consideration of risks, benefits and other options for treatment, the patient has consented to  Procedure(s): LEFT CAROTID ENDARTERECTOMY (Left) as a surgical intervention.  The patient's history has been reviewed, patient examined, no change in status, stable for surgery.  I have reviewed the patient's chart and labs.  Questions were answered to the patient's satisfaction.     Servando Snare

## 2022-04-19 NOTE — Anesthesia Procedure Notes (Signed)
Procedure Name: Intubation Date/Time: 04/19/2022 7:45 AM  Performed by: Claris Che, CRNAPre-anesthesia Checklist: Patient identified, Emergency Drugs available, Suction available and Patient being monitored Patient Re-evaluated:Patient Re-evaluated prior to induction Oxygen Delivery Method: Circle system utilized Preoxygenation: Pre-oxygenation with 100% oxygen Induction Type: IV induction Ventilation: Mask ventilation without difficulty Grade View: Grade I Tube type: Oral Tube size: 7.0 mm Number of attempts: 1 Airway Equipment and Method: Stylet Placement Confirmation: ETT inserted through vocal cords under direct vision, positive ETCO2 and breath sounds checked- equal and bilateral Secured at: 23 cm Tube secured with: Tape Dental Injury: Teeth and Oropharynx as per pre-operative assessment

## 2022-04-19 NOTE — TOC Initial Note (Signed)
Transition of Care [facility]) - Initial/Assessment Note    Patient Details  Name: Leon Mitchell MRN: [id number] Date of Birth: Jun 25, 1956  [facility]) CM/SW Contact:    Ninfa Meeker, RN Phone Number: 04/19/2022, 3:42 PM  Clinical Narrative:                 Transition of Care Screening Note:  Transition of Care Department [facility]) has reviewed patient and no TOC needs have been identified at this time. We will continue to monitor patient advancement through Interdisciplinary progressions. If new patient transition needs arise, please place a consult.          Patient Goals and CMS Choice        Expected Discharge Plan and Services                                                Prior Living Arrangements/Services                       Activities of Daily Living      Permission Sought/Granted                  Emotional Assessment              Admission diagnosis:  Carotid artery stenosis [I65.29] Patient Active Problem List   Diagnosis Date Noted   Carotid artery stenosis 04/19/2022   COPD GOLD 0  / active smoker  06/17/2020   Solitary pulmonary nodule on lung CT 06/17/2020   Cigarette smoker 06/17/2020   Essential hypertension 06/17/2020   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 [place] [address] Phone: [phone] Fax: [phone]     Social Determinants of Health (SDOH) Interventions    Readmission Risk Interventions     No data to display

## 2022-04-19 NOTE — Anesthesia Procedure Notes (Signed)
Arterial Line Insertion Start/End7/08/2022 6:45 AM, 04/19/2022 7:05 AM Performed by: Freddrick March, MD, Claris Che, CRNA, CRNA  Patient location: Pre-op. Preanesthetic checklist: patient identified Lidocaine 1% used for infiltration Right, radial was placed Catheter size: 20 G Hand hygiene performed , maximum sterile barriers used  and Seldinger technique used Allen's test indicative of satisfactory collateral circulation Attempts: 1 Procedure performed without using ultrasound guided technique. Following insertion, dressing applied and Biopatch. Post procedure assessment: normal and unchanged  Patient tolerated the procedure well with no immediate complications.

## 2022-04-19 NOTE — Op Note (Signed)
Patient name: Leon Mitchell MRN: [id number] DOB: 1955/12/06 Sex: male  04/19/2022 Pre-operative Diagnosis: asymptomatic left carotid stenosis Post-operative diagnosis:  Same Surgeon:  Erlene Quan C. 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  We have reviewed his ultrasound and CT scan and are not planning for left carotid endarterectomy with patch angioplasty.  Findings: Common carotid artery was disease at the level of the bifurcation.  There was no evidence of pseudoaneurysm but there were multiple channels and the artery actually appeared subtotally occluded with calcified and soft plaque.  Higher up the carotid was very tortuous.  There was very strong backbleeding I elected not to place a shunt given the tortuosity and the strong backbleeding.  At completion there was a very strong signal with low resistance flow in the internal carotid artery and high resistance flow in the external carotid artery as expected.  The patient was neurologically intact upon awakening from anesthesia.   Procedure:  The patient was identified in the holding area and taken to the operating where is placed supine after table and general anesthesia was induced.  He was sterilely prepped and draped in the left neck and chest in usual fashion, antibiotics were administered timeout was called.  We began with longitudinal incision along the anterior border the sternocleidomastoid dissected down through the skin and platysma.  The sternocleidomastoid was retracted laterally.  The common carotid artery was identified patient was fully heparinized and ACT returned greater than 260.  We encircled the common carotid artery with umbilical tape.  The facial vein was identified and divided between  ties.  We dissected up higher to the external carotid artery and placed a vessel loop around this as well as the superior thyroidal branch.  We then identified the hypoglossal nerve and protected this and divided small veins through clips around it.  The internal carotid artery was traced high until he was very tortuous we placed a vessel loop around it just before the tortuosity as it appeared normal externally.  We prepared a 10 [place] shunt and bovine pericardial patch.  We then clamped the ICA followed by the common carotid artery and external carotid arteries.  We opened the vessel longitudinally where there was significant plaque in multiple pathways appeared subtotally occlusive.  We proceeded with endarterectomy and there was smooth tapering distally.  We flushed the carotid bulb with dextran heparinized saline.  Endarterectomy was completed including eversion of the external carotid artery.  Satisfied with the endarterectomy we proceeded with patch angioplasty with bovine pericardium.  Prior to completion of this we thoroughly irrigated carotid bulb again with heparinized saline and dextran.  We also allowed antegrade and backbleeding and flushing techniques.  Upon completion we then released the clamp on the external carotid artery followed by the common carotid artery after several cardiac cycles the internal carotid artery.  We did to place a few repair stitches.  There was an expected signal in the internal carotid artery and 50 mg of protamine was administered.  We then obtained hemostasis in the wound and then closed in layers with Vicryl and Monocryl.  Dermabond was placed at the skin level.  All counts were correct at completion.  He was awakened from anesthesia and noted be neurologically intact transferred to the recovery area in stable condition.    Given the  complexity of the case,  the assistant was necessary in order to expedient the procedure and safely perform the technical aspects of  the operation.  The assistant provided traction and countertraction to assist with exposure of the common carotid artery, external carotid artery, and internal carotid artery.  They also assisted with suture ligatures and dividing the facial vein and multiple small venous branches tethering the hypoglossal nerve.  In addition they were necessary to provide adequate traction and countertraction to perform a precise endarterectomy and precise closure.  These skills, especially following the Prolene suture for the anastomosis, could not have been adequately performed by a scrub tech assistant.   EBL: 100 cc   Leon Mitchell C. Donzetta Matters, MD [facility] Office: [phone] Pager: [phone]

## 2022-04-19 NOTE — Progress Notes (Signed)
  Progress Note    04/19/2022 3:06 PM Day of Surgery  Subjective:  no complaints. Mild neck soreness. Denies any speech difficulty, trouble swallowing, visual changes, headache, or dizziness. Moving all extremities without deficits. Voiding without issues. Has not eaten yet   Vitals:   04/19/22 1215 04/19/22 1426  BP: 107/69 121/84  Pulse:  65  Resp:  16  Temp:  97.7 F (36.5 C)  SpO2:  97%   Physical Exam: Cardiac:  regular Lungs:  non labored Incisions:  left neck incision is c/d/I without swelling or hematoma Extremities:  moving all extremities without deficits Neurologic: CN intact. Speech coherent. Smile symmetric.  CBC    Component Value Date/Time   WBC 9.0 04/07/2022 0827   RBC 5.34 04/07/2022 0827   HGB 16.4 04/07/2022 0827   HCT 47.7 04/07/2022 0827   PLT 342 04/07/2022 0827   MCV 89.3 04/07/2022 0827   MCH 30.7 04/07/2022 0827   MCHC 34.4 04/07/2022 0827   RDW 15.9 (H) 04/07/2022 0827    BMET    Component Value Date/Time   NA 140 04/07/2022 0827   K 4.3 04/07/2022 0827   CL 106 04/07/2022 0827   CO2 26 04/07/2022 0827   GLUCOSE 101 (H) 04/07/2022 0827   BUN 30 (H) 04/07/2022 0827   CREATININE 0.96 04/07/2022 0827   CALCIUM 9.5 04/07/2022 0827   GFRNONAA >60 04/07/2022 0827    INR    Component Value Date/Time   INR 1.0 04/07/2022 0827     Intake/Output Summary (Last 24 hours) at 04/19/2022 1506 Last data filed at 04/19/2022 0916 Gross per 24 hour  Intake 1200 ml  Output 20 ml  Net 1180 ml     Assessment/Plan:  66 y.o. male is s/p left CEA Day of Surgery   Doing well post op Incision is c/d/I without swelling or hematoma No neurological deficits Hemodynamically stable Anticipate discharge tomorrow if no overnight issues   Karoline Caldwell, PA-C [facility] [phone] 04/19/2022 3:06 PM

## 2022-04-19 NOTE — Anesthesia Postprocedure Evaluation (Signed)
Anesthesia Post Note  Patient: Leon Mitchell  Procedure(s) Performed: LEFT CAROTID ENDARTERECTOMY (Left) PATCH ANGIOPLASTY using Xenosure Biologic Patch (Left: Neck)     Patient location during evaluation: PACU Anesthesia Type: General Level of consciousness: awake and alert Pain management: pain level controlled Vital Signs Assessment: post-procedure vital signs reviewed and stable Respiratory status: spontaneous breathing, nonlabored ventilation, respiratory function stable and patient connected to nasal cannula oxygen Cardiovascular status: blood pressure returned to baseline and stable Postop Assessment: no apparent nausea or vomiting Anesthetic complications: no   No notable events documented.  Last Vitals:  Vitals:   04/19/22 1015 04/19/22 1030  BP: (!) 142/85 99/62  Pulse: 76 77  Resp: 20 17  Temp:    SpO2:  94%    Last Pain:  Vitals:   04/19/22 1030  TempSrc:   PainSc: 2                  Pegge Cumberledge L Deb Loudin

## 2022-04-19 NOTE — Transfer of Care (Signed)
Immediate Anesthesia Transfer of Care Note  Patient: Leon Mitchell  Procedure(s) Performed: LEFT CAROTID ENDARTERECTOMY (Left) PATCH ANGIOPLASTY using Xenosure Biologic Patch (Left: Neck)  Patient Location: PACU  Anesthesia Type:General  Level of Consciousness: awake, alert , oriented and patient cooperative  Airway & Oxygen Therapy: Patient Spontanous Breathing and Patient connected to face mask oxygen  Post-op Assessment: Report given to RN, Post -op Vital signs reviewed and stable and Patient moving all extremities X 4  Post vital signs: Reviewed and stable  Last Vitals:  Vitals Value Taken Time  BP 144/102 04/19/22 0949  Temp    Pulse 83 04/19/22 0951  Resp 24 04/19/22 0951  SpO2 92 % 04/19/22 0951  Vitals shown include unvalidated device data.  Last Pain:  Vitals:   04/19/22 0609  TempSrc:   PainSc: 7          Complications: No notable events documented.

## 2022-04-20 ENCOUNTER — Other Ambulatory Visit (HOSPITAL_COMMUNITY): Payer: Self-pay

## 2022-04-20 ENCOUNTER — Encounter (HOSPITAL_COMMUNITY): Payer: Self-pay | Admitting: Vascular Surgery

## 2022-04-20 ENCOUNTER — Other Ambulatory Visit: Payer: Self-pay

## 2022-04-20 LAB — BASIC METABOLIC PANEL
Anion gap: 6 (ref 5–15)
BUN: 25 mg/dL — ABNORMAL HIGH (ref 8–23)
CO2: 25 mmol/L (ref 22–32)
Calcium: 8.8 mg/dL — ABNORMAL LOW (ref 8.9–10.3)
Chloride: 103 mmol/L (ref 98–111)
Creatinine, Ser: 0.98 mg/dL (ref 0.61–1.24)
GFR, Estimated: >60 mL/min (ref >60)
Glucose, Bld: 114 mg/dL — ABNORMAL HIGH (ref 70–99)
Potassium: 4.8 mmol/L (ref 3.5–5.1)
Sodium: 134 mmol/L — ABNORMAL LOW (ref 135–145)

## 2022-04-20 LAB — CBC
HCT: 41.8 % (ref 39.0–52.0)
Hemoglobin: 14.5 g/dL (ref 13.0–17.0)
MCH: 30.9 pg (ref 26.0–34.0)
MCHC: 34.7 g/dL (ref 30.0–36.0)
MCV: 88.9 fL (ref 80.0–100.0)
Platelets: 295 10*3/uL (ref 150–400)
RBC: 4.7 MIL/uL (ref 4.22–5.81)
RDW: 15.6 % — ABNORMAL HIGH (ref 11.5–15.5)
WBC: 13.2 10*3/uL — ABNORMAL HIGH (ref 4.0–10.5)
nRBC: 0 % (ref 0.0–0.2)

## 2022-04-20 LAB — LIPID PANEL
Cholesterol: 192 mg/dL (ref 0–200)
HDL: 34 mg/dL — ABNORMAL LOW (ref >40)
LDL Cholesterol: 137 mg/dL — ABNORMAL HIGH (ref 0–99)
Total CHOL/HDL Ratio: 5.6 RATIO
Triglycerides: 104 mg/dL (ref <150)
VLDL: 21 mg/dL (ref 0–40)

## 2022-04-20 MED ORDER — ATORVASTATIN CALCIUM 20 MG PO TABS
20.0000 mg | ORAL_TABLET | Freq: Every day | ORAL | 0 refills | Status: DC
  Filled 2022-04-20: qty 60, 60d supply, fill #0

## 2022-04-20 NOTE — Progress Notes (Signed)
Patient and family given discharge instructions, medication list and follow up appointments. All questions answered. IV and tele were removed will discharge home as ordered. Transported to exit via wheelchair and nursing staff. Amelianna Meller, Bettina Gavia  RN

## 2022-04-20 NOTE — Plan of Care (Signed)
  Problem: Health Behavior/Discharge Planning: Goal: Ability to manage health-related needs will improve Outcome: Progressing   Problem: Clinical Measurements: Goal: Ability to maintain clinical measurements within normal limits will improve Outcome: Progressing Goal: Will remain free from infection Outcome: Progressing   Problem: Activity: Goal: Risk for activity intolerance will decrease Outcome: Progressing   

## 2022-04-20 NOTE — Plan of Care (Signed)
  Problem: Education: Goal: Knowledge of General Education information will improve Description: Including pain rating scale, medication(s)/side effects and non-pharmacologic comfort measures Outcome: Progressing   Problem: Nutrition: Goal: Adequate nutrition will be maintained Outcome: Progressing   

## 2022-04-20 NOTE — Progress Notes (Addendum)
      Subjective  - Doing well without new complaints   Objective 139/90 63 98.2 F (36.8 C) 15 96%  Intake/Output Summary (Last 24 hours) at 04/20/2022 0817 Last data filed at 04/20/2022 0700 Gross per 24 hour  Intake 2146.89 ml  Output 1720 ml  Net 426.89 ml    Left neck incision healing well without frank hematoma Moving all 4 extremities without weakness No tongue deviation or facial droop Lungs non labored breathing  Assessment/Planning: POD # 1 left CEA  No neurologic deficits Tol PO's, voided and pain well controlled with PO medications.  He is on chronic pain medication and has some at home.   No new narcotics prescribed today Discharge home in stable condition F/U in 2 weeks for incision checks.  Roxy Horseman 04/20/2022 8:17 AM --  Laboratory Lab Results: Recent Labs    04/20/22 0321  WBC 13.2*  HGB 14.5  HCT 41.8  PLT 295   BMET Recent Labs    04/20/22 0321  NA 134*  K 4.8  CL 103  CO2 25  GLUCOSE 114*  BUN 25*  CREATININE 0.98  CALCIUM 8.8*    COAG Lab Results  Component Value Date   INR 1.0 04/07/2022   No results found for: "PTT"  I have independently interviewed and examined patient and agree with PA assessment and plan above. Will release for spine surgery after seeing him back in the office.  Ivonne Freeburg C. Donzetta Matters, MD [facility] Office: [phone] Pager: [phone]

## 2022-04-20 NOTE — Discharge Instructions (Signed)
   [facility]  Discharge Instructions   Carotid Endarterectomy (CEA)  Please refer to the following instructions for your post-procedure care. Your surgeon or physician assistant will discuss any changes with you.  Activity  You are encouraged to walk as much as you can. You can slowly return to normal activities but must avoid strenuous activity and heavy lifting until your doctor tell you it's OK. Avoid activities such as vacuuming or swinging a golf club. You can drive after one week if you are comfortable and you are no longer taking prescription pain medications. It is normal to feel tired for serval weeks after your surgery. It is also normal to have difficulty with sleep habits, eating, and bowel movements after surgery. These will go away with time.  Bathing/Showering  You may shower after you come home. Do not soak in a bathtub, hot tub, or swim until the incision heals completely.  Incision Care  Shower every day. Clean your incision with mild soap and water. Pat the area dry with a clean towel. You do not need a bandage unless otherwise instructed. Do not apply any ointments or creams to your incision. You may have skin glue on your incision. Do not peel it off. It will come off on its own in about one week. Your incision may feel thickened and raised for several weeks after your surgery. This is normal and the skin will soften over time. For Men Only: It's OK to shave around the incision but do not shave the incision itself for 2 weeks. It is common to have numbness under your chin that could last for several months.  Diet  Resume your normal diet. There are no special food restrictions following this procedure. A low fat/low cholesterol diet is recommended for all patients with vascular disease. In order to heal from your surgery, it is CRITICAL to get adequate nutrition. Your body requires vitamins, minerals, and protein. Vegetables are the best  source of vitamins and minerals. Vegetables also provide the perfect balance of protein. Processed food has little nutritional value, so try to avoid this.        Medications  Resume taking all of your medications unless your doctor or physician assistant tells you not to. If your incision is causing pain, you may take over-the- counter pain relievers such as acetaminophen (Tylenol). If you were prescribed a stronger pain medication, please be aware these medications can cause nausea and constipation. Prevent nausea by taking the medication with a snack or meal. Avoid constipation by drinking plenty of fluids and eating foods with a high amount of fiber, such as fruits, vegetables, and grains. Do not take Tylenol if you are taking prescription pain medications.  Follow Up  Our office will schedule a follow up appointment 2-3 weeks following discharge.  Please call us immediately for any of the following conditions  Increased pain, redness, drainage (pus) from your incision site. Fever of 101 degrees or higher. If you should develop stroke (slurred speech, difficulty swallowing, weakness on one side of your body, loss of vision) you should call 911 and go to the nearest emergency room.  Reduce your risk of vascular disease:  Stop smoking. If you would like help call [facility] at 1-800-QUIT-NOW (1-800-784-8669) or [place] at 336-586-4000. Manage your cholesterol Maintain a desired weight Control your diabetes Keep your blood pressure down  If you have any questions, please call the office at [phone].   

## 2022-04-20 NOTE — Progress Notes (Signed)
PHARMACIST LIPID MONITORING   Leon Mitchell is a 66 y.o. male admitted on 04/19/2022 for elective CEA.  Pharmacy has been consulted to optimize lipid-lowering therapy with the indication of secondary prevention for clinical ASCVD.  Recent Labs:  Lipid Panel (last 6 months):   Lab Results  Component Value Date   CHOL 192 04/20/2022   TRIG 104 04/20/2022   HDL 34 (L) 04/20/2022   CHOLHDL 5.6 04/20/2022   VLDL 21 04/20/2022   LDLCALC 137 (H) 04/20/2022    Hepatic function panel (last 6 months):   Lab Results  Component Value Date   AST 19 04/07/2022   ALT 15 04/07/2022   ALKPHOS 88 04/07/2022   BILITOT 0.5 04/07/2022    SCr (since admission):   Serum creatinine: 0.98 mg/dL 04/20/22 0321 Estimated creatinine clearance: 87.2 mL/min  Current therapy and lipid therapy tolerance Current lipid-lowering therapy: started this admit on atorvastatin '20mg'$  Previous lipid-lowering therapies (if applicable): none Documented or reported allergies or intolerances to lipid-lowering therapies (if applicable): none  Assessment:   Atorvastatin '20mg'$  just initiated, patient to follow up   Plan:    1.Statin intensity (high intensity recommended for all patients regardless of the LDL):  No change at this time, follow up lab recheck  2.Add ezetimibe (if any one of the following):   Not indicated at this time.  3.Refer to lipid clinic:   No  4.Follow-up with:  Primary care provider - Glenda Chroman, MD  5.Follow-up labs after discharge:  Changes in lipid therapy were made. Check a lipid panel in 8-12 weeks then annually.      Erin Hearing PharmD., BCPS Clinical Pharmacist 04/20/2022 8:44 AM

## 2022-04-21 DIAGNOSIS — Z87442 Personal history of urinary calculi: Secondary | ICD-10-CM | POA: Diagnosis not present

## 2022-04-21 DIAGNOSIS — Z7982 Long term (current) use of aspirin: Secondary | ICD-10-CM | POA: Diagnosis not present

## 2022-04-21 DIAGNOSIS — K219 Gastro-esophageal reflux disease without esophagitis: Secondary | ICD-10-CM | POA: Diagnosis not present

## 2022-04-21 DIAGNOSIS — I10 Essential (primary) hypertension: Secondary | ICD-10-CM | POA: Diagnosis not present

## 2022-04-21 DIAGNOSIS — Z48812 Encounter for surgical aftercare following surgery on the circulatory system: Secondary | ICD-10-CM | POA: Diagnosis not present

## 2022-04-25 ENCOUNTER — Other Ambulatory Visit: Payer: Self-pay | Admitting: Neurological Surgery

## 2022-04-25 DIAGNOSIS — Z7982 Long term (current) use of aspirin: Secondary | ICD-10-CM | POA: Diagnosis not present

## 2022-04-25 DIAGNOSIS — I10 Essential (primary) hypertension: Secondary | ICD-10-CM | POA: Diagnosis not present

## 2022-04-25 DIAGNOSIS — K219 Gastro-esophageal reflux disease without esophagitis: Secondary | ICD-10-CM | POA: Diagnosis not present

## 2022-04-25 DIAGNOSIS — Z87442 Personal history of urinary calculi: Secondary | ICD-10-CM | POA: Diagnosis not present

## 2022-04-25 DIAGNOSIS — Z48812 Encounter for surgical aftercare following surgery on the circulatory system: Secondary | ICD-10-CM | POA: Diagnosis not present

## 2022-04-26 NOTE — Discharge Summary (Signed)
[facility] Discharge Summary   Patient ID:  Leon Mitchell MRN: [id number] DOB/AGE: 1956/03/19 66 y.o.  Admit date: 04/19/2022 Discharge date: 04/20/22 Date of Surgery: 04/19/2022 Surgeon: Surgeon(s): Waynetta Sandy, MD  Admission Diagnosis: Carotid artery stenosis [I65.29]  Discharge Diagnoses:  Carotid artery stenosis [I65.29]  Secondary Diagnoses: Past Medical History:  Diagnosis Date   Bulging lumbar disc    GERD (gastroesophageal reflux disease)    Hypertension    Kidney stones    Pneumonia    May 2023    Procedure(s): LEFT CAROTID ENDARTERECTOMY PATCH ANGIOPLASTY using Xenosure Biologic Patch  Discharged Condition: good  HPI: Leon Mitchell is a 66 y.o. 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  He was scheduled for left  CEA.   Hospital Course:  Leon Mitchell is a 66 y.o. male is S/P Left Procedure(s): LEFT CAROTID ENDARTERECTOMY PATCH ANGIOPLASTY using Xenosure Biologic Patch Post op no neurologic deficits, no tongue deviation or facial droop.  He was discharged home in stable condition.  F/U in 2 weeks for incision check.    Significant Diagnostic Studies: CBC Lab Results  Component Value Date   WBC 13.2 (H) 04/20/2022   HGB 14.5 04/20/2022   HCT 41.8 04/20/2022   MCV 88.9 04/20/2022   PLT 295 04/20/2022    BMET    Component Value Date/Time   NA 134 (L) 04/20/2022 0321   K 4.8 04/20/2022 0321   CL 103 04/20/2022 0321   CO2 25 04/20/2022 0321   GLUCOSE 114 (H) 04/20/2022 0321   BUN 25 (H) 04/20/2022 0321   CREATININE 0.98 04/20/2022 0321   CALCIUM 8.8 (L) 04/20/2022 0321   GFRNONAA >60 04/20/2022 0321   COAG Lab Results  Component Value Date   INR 1.0 04/07/2022     Disposition:  Discharge to :Home Discharge Instructions     Call MD for:  redness, tenderness, or signs of infection (pain, swelling, bleeding, redness, odor or green/yellow discharge around incision site)    Complete by: As directed    Call MD for:  severe or increased pain, loss or decreased feeling  in affected limb(s)   Complete by: As directed    Call MD for:  temperature >100.5   Complete by: As directed    Resume previous diet   Complete by: As directed       Allergies as of 04/20/2022   No Known Allergies      Medication List     TAKE these medications    aspirin EC 81 MG tablet Take 81 mg by mouth daily. Swallow whole.   atorvastatin 20 MG tablet Commonly known as: LIPITOR Take 1 tablet (20 mg total) by mouth daily.   azithromycin 250 MG tablet Commonly known as: ZITHROMAX Take 2 tablets today, then one tablet daily x 4 days. Notes to patient: Go by instructions on the medicine bottle   diclofenac 75 MG EC tablet Commonly known as: VOLTAREN Take 75 mg by mouth 2 (two) times daily.   gabapentin 600 MG tablet Commonly known as: NEURONTIN Take 600 mg by mouth 3 (three) times daily.   lisinopril 20 MG tablet Commonly known as: ZESTRIL Take 1 tablet (20 mg total) by mouth daily.   oxyCODONE-acetaminophen 5-325 MG tablet Commonly known as: PERCOCET/ROXICET Take 1 tablet by mouth every 6 (six) hours as needed for severe pain.   traMADol 50 MG tablet Commonly known as: ULTRAM Take 50 mg by  mouth every 6 (six) hours as needed for severe pain.   Vitamin D (Ergocalciferol) 1.25 MG (50000 UNIT) Caps capsule Commonly known as: DRISDOL Take 50,000 Units by mouth every Sunday. Notes to patient: Take as you were prior to admission        Verbal and written Discharge instructions given to the patient. Wound care per Discharge AVS  Follow-up Information     Waynetta Sandy, MD Follow up in 2 week(s).   Specialties: Vascular Surgery, Cardiology Why: Office will call you to arrange your appt (sent) Contact information: [address] [place] [address] [phone]                 Signed: Roxy Horseman 04/26/2022, 9:45 AM  --- For  VQI Registry use --- Instructions: Press F2 to tab through selections.  Delete question if not applicable.   Modified Rankin score at D/C (0-6): Rankin Score=0  IV medication needed for:  1. Hypertension: No 2. Hypotension: No  Post-op Complications: No  1. Post-op CVA or TIA: No  If yes: Event classification (right eye, left eye, right cortical, left cortical, verterobasilar, other):   If yes: Timing of event (intra-op, <6 hrs post-op, >=6 hrs post-op, unknown):   2. CN injury: No  If yes: CN  injuried   3. Myocardial infarction: No  If yes: Dx by (EKG or clinical, Troponin):   4.  CHF: No  5.  Dysrhythmia (new): No  6. Wound infection: No  7. Reperfusion symptoms: No  8. Return to OR: No  If yes: return to OR for (bleeding, neurologic, other CEA incision, other):   Discharge medications: Statin use:  Yes ASA use:  Yes Beta blocker use:  No  for medical reason   ACE-Inhibitor use:  Yes P2Y12 Antagonist use: '[ ]'$  None, '[ ]'$  Plavix, '[ ]'$  Plasugrel, '[ ]'$  Ticlopinine, '[ ]'$  Ticagrelor, '[ ]'$  Other, '[ ]'$  No for medical reason, '[ ]'$  Non-compliant, '[ ]'$  Not-indicated Anti-coagulant use:  '[ ]'$  None, '[ ]'$  Warfarin, '[ ]'$  Rivaroxaban, '[ ]'$  Dabigatran, '[ ]'$  Other, '[ ]'$  No for medical reason, '[ ]'$  Non-compliant, '[ ]'$  Not-indicated

## 2022-04-27 DIAGNOSIS — Z299 Encounter for prophylactic measures, unspecified: Secondary | ICD-10-CM | POA: Diagnosis not present

## 2022-04-27 DIAGNOSIS — J439 Emphysema, unspecified: Secondary | ICD-10-CM | POA: Diagnosis not present

## 2022-04-27 DIAGNOSIS — I10 Essential (primary) hypertension: Secondary | ICD-10-CM | POA: Diagnosis not present

## 2022-04-27 DIAGNOSIS — J441 Chronic obstructive pulmonary disease with (acute) exacerbation: Secondary | ICD-10-CM | POA: Diagnosis not present

## 2022-04-27 DIAGNOSIS — I779 Disorder of arteries and arterioles, unspecified: Secondary | ICD-10-CM | POA: Diagnosis not present

## 2022-04-28 DIAGNOSIS — K219 Gastro-esophageal reflux disease without esophagitis: Secondary | ICD-10-CM | POA: Diagnosis not present

## 2022-04-28 DIAGNOSIS — Z7982 Long term (current) use of aspirin: Secondary | ICD-10-CM | POA: Diagnosis not present

## 2022-04-28 DIAGNOSIS — Z87442 Personal history of urinary calculi: Secondary | ICD-10-CM | POA: Diagnosis not present

## 2022-04-28 DIAGNOSIS — I10 Essential (primary) hypertension: Secondary | ICD-10-CM | POA: Diagnosis not present

## 2022-04-28 DIAGNOSIS — Z48812 Encounter for surgical aftercare following surgery on the circulatory system: Secondary | ICD-10-CM | POA: Diagnosis not present

## 2022-05-02 DIAGNOSIS — K219 Gastro-esophageal reflux disease without esophagitis: Secondary | ICD-10-CM | POA: Diagnosis not present

## 2022-05-02 DIAGNOSIS — Z87442 Personal history of urinary calculi: Secondary | ICD-10-CM | POA: Diagnosis not present

## 2022-05-02 DIAGNOSIS — Z48812 Encounter for surgical aftercare following surgery on the circulatory system: Secondary | ICD-10-CM | POA: Diagnosis not present

## 2022-05-02 DIAGNOSIS — Z7982 Long term (current) use of aspirin: Secondary | ICD-10-CM | POA: Diagnosis not present

## 2022-05-02 DIAGNOSIS — I10 Essential (primary) hypertension: Secondary | ICD-10-CM | POA: Diagnosis not present

## 2022-05-02 NOTE — Progress Notes (Unsigned)
  POST OPERATIVE OFFICE NOTE    CC:  F/u for surgery  HPI:  This is a 66 y.o. male who is s/p left CEA on 04/19/2022 by Dr. Donzetta Matters for high grade asymptomatic carotid artery stenosis.  The right was in the 1-39% range.   Pt returns today for follow up.  Pt states he has done well since he has been home from surgery.  He denies any visual changes, speech trouble or unilateral weakness, numbness or paralysis.  He denies any trouble swallowing.  He is compliant with his asa and statin.     No Known Allergies  Current Outpatient Medications  Medication Sig Dispense Refill   aspirin EC 81 MG tablet Take 81 mg by mouth daily. Swallow whole.     atorvastatin (LIPITOR) 20 MG tablet Take 1 tablet (20 mg total) by mouth daily. 60 tablet 0   azithromycin (ZITHROMAX) 250 MG tablet Take 2 tablets today, then one tablet daily x 4 days. 6 tablet 0   diclofenac (VOLTAREN) 75 MG EC tablet Take 75 mg by mouth 2 (two) times daily.     gabapentin (NEURONTIN) 600 MG tablet Take 600 mg by mouth 3 (three) times daily.     lisinopril (ZESTRIL) 20 MG tablet Take 1 tablet (20 mg total) by mouth daily. 90 tablet 3   oxyCODONE-acetaminophen (PERCOCET/ROXICET) 5-325 MG tablet Take 1 tablet by mouth every 6 (six) hours as needed for severe pain.     traMADol (ULTRAM) 50 MG tablet Take 50 mg by mouth every 6 (six) hours as needed for severe pain.     Vitamin D, Ergocalciferol, (DRISDOL) 1.25 MG (50000 UNIT) CAPS capsule Take 50,000 Units by mouth every Sunday.     No current facility-administered medications for this visit.   Facility-Administered Medications Ordered in Other Visits  Medication Dose Route Frequency Provider Last Rate Last Admin   gadopentetate dimeglumine (MAGNEVIST) injection 20 mL  20 mL Intravenous Once PRN Penumalli, Earlean Polka, MD         ROS:  See HPI  Physical Exam:  Today's Vitals   05/04/22 0932 05/04/22 0933  BP: (!) 140/94 (!) 154/85  Pulse: (!) 59   Resp: 20   Temp: 97.7 F (36.5  C)   TempSrc: Temporal   SpO2: 97%   Weight: 204 lb 6.4 oz (92.7 kg)   Height: 6' (1.829 m)    Body mass index is 27.72 kg/m.   Incision:  well  healed Extremities:  moving all extremities equally Neuro: in tact    Assessment/Plan:  This is a 66 y.o. male who is s/p: left CEA on 04/19/2022 by Dr. Donzetta Matters for high grade asymptomatic carotid artery stenosis  Carotid artery stenosis -pt doing well without neurological sx -pt will f/u in 9 months with carotid duplex-he will call sooner if he has any issues before then -he knows to call 911 if he develops any neurological sx -continue asa/statin-discussed the importance of continuing the statin.  Hypertension -discussed checking BP in the am and pm and keeping a log to take to his PCP to determine if he needs any adjustments to his medications.   Current smoker He continues to smoke - discussed the importance of smoking cessation.      Leontine Locket, [facility] [phone]   Clinic MD:  Scot Dock

## 2022-05-04 ENCOUNTER — Ambulatory Visit (INDEPENDENT_AMBULATORY_CARE_PROVIDER_SITE_OTHER): Payer: Medicare Other | Admitting: Physician Assistant

## 2022-05-04 VITALS — BP 154/85 | HR 59 | Temp 97.7°F | Resp 20 | Ht 72.0 in | Wt 204.4 lb

## 2022-05-04 DIAGNOSIS — I6523 Occlusion and stenosis of bilateral carotid arteries: Secondary | ICD-10-CM

## 2022-05-05 ENCOUNTER — Other Ambulatory Visit: Payer: Self-pay

## 2022-05-05 DIAGNOSIS — Z7982 Long term (current) use of aspirin: Secondary | ICD-10-CM | POA: Diagnosis not present

## 2022-05-05 DIAGNOSIS — Z48812 Encounter for surgical aftercare following surgery on the circulatory system: Secondary | ICD-10-CM | POA: Diagnosis not present

## 2022-05-05 DIAGNOSIS — K219 Gastro-esophageal reflux disease without esophagitis: Secondary | ICD-10-CM | POA: Diagnosis not present

## 2022-05-05 DIAGNOSIS — I6523 Occlusion and stenosis of bilateral carotid arteries: Secondary | ICD-10-CM

## 2022-05-05 DIAGNOSIS — Z87442 Personal history of urinary calculi: Secondary | ICD-10-CM | POA: Diagnosis not present

## 2022-05-05 DIAGNOSIS — I10 Essential (primary) hypertension: Secondary | ICD-10-CM | POA: Diagnosis not present

## 2022-05-10 DIAGNOSIS — Z79899 Other long term (current) drug therapy: Secondary | ICD-10-CM | POA: Diagnosis not present

## 2022-05-10 DIAGNOSIS — F17210 Nicotine dependence, cigarettes, uncomplicated: Secondary | ICD-10-CM | POA: Diagnosis not present

## 2022-05-10 DIAGNOSIS — Z0000 Encounter for general adult medical examination without abnormal findings: Secondary | ICD-10-CM | POA: Diagnosis not present

## 2022-05-10 DIAGNOSIS — R5383 Other fatigue: Secondary | ICD-10-CM | POA: Diagnosis not present

## 2022-05-10 DIAGNOSIS — I10 Essential (primary) hypertension: Secondary | ICD-10-CM | POA: Diagnosis not present

## 2022-05-10 DIAGNOSIS — Z125 Encounter for screening for malignant neoplasm of prostate: Secondary | ICD-10-CM | POA: Diagnosis not present

## 2022-05-10 DIAGNOSIS — Z299 Encounter for prophylactic measures, unspecified: Secondary | ICD-10-CM | POA: Diagnosis not present

## 2022-05-10 DIAGNOSIS — E7800 Pure hypercholesterolemia, unspecified: Secondary | ICD-10-CM | POA: Diagnosis not present

## 2022-05-10 DIAGNOSIS — Z7189 Other specified counseling: Secondary | ICD-10-CM | POA: Diagnosis not present

## 2022-05-10 DIAGNOSIS — Z6828 Body mass index (BMI) 28.0-28.9, adult: Secondary | ICD-10-CM | POA: Diagnosis not present

## 2022-05-10 DIAGNOSIS — Z1331 Encounter for screening for depression: Secondary | ICD-10-CM | POA: Diagnosis not present

## 2022-05-10 DIAGNOSIS — N529 Male erectile dysfunction, unspecified: Secondary | ICD-10-CM | POA: Diagnosis not present

## 2022-05-16 NOTE — Pre-Procedure Instructions (Signed)
Surgical Instructions    Your procedure is scheduled on Monday, August 14.  Report to [facility] Main Entrance "A" at 5:30 A.M., then check in with the Admitting office.  Call this number if you have problems the morning of surgery:  [phone]   If you have any questions prior to your surgery date call [phone]: Open Monday-Friday 8am-4pm    Remember:  Do not eat after midnight the night before your surgery  You may drink clear liquids until 4:30AM the morning of your surgery.   Clear liquids allowed are: Water, Non-Citrus Juices (without pulp), Carbonated Beverages, Clear Tea, Black Coffee ONLY (NO MILK, CREAM OR POWDERED CREAMER of any kind), and Gatorade    Take these medicines the morning of surgery with A SIP OF WATER:  gabapentin (NEURONTIN) oxyCODONE-acetaminophen (PERCOCET/ROXICET) if  needed or traMADol (ULTRAM) If needed  As of today, STOP taking any diclofenac (VOLTAREN), Aleve, Naproxen, Ibuprofen, Motrin, Advil, Goody's, BC's, all herbal medications, fish oil, and all vitamins.  Follow your surgeon's instructions on when to stop Aspirin.  If no instructions were given by your surgeon then you will need to call the office to get those instructions.     [facility] is not responsible for any belongings or valuables.    Do NOT Smoke (Tobacco/Vaping)  24 hours prior to your procedure  If you use a CPAP at night, you may bring your mask for your overnight stay.   Contacts, glasses, hearing aids, dentures or partials may not be worn into surgery, please bring cases for these belongings   For patients admitted to the hospital, discharge time will be determined by your treatment team.   Patients discharged the day of surgery will not be allowed to drive home, and someone needs to stay with them for 24 hours.   SURGICAL WAITING ROOM VISITATION Patients having surgery or a procedure may have no more than 2 support people in the waiting area - these visitors may  rotate.   Children under the age of 86 must have an adult with them who is not the patient. If the patient needs to stay at the hospital during part of their recovery, the visitor guidelines for inpatient rooms apply. Pre-op nurse will coordinate an appropriate time for 1 support person to accompany patient in pre-op.  This support person may not rotate.   Please refer to the [facility] website for the visitor guidelines for Inpatients (after your surgery is over and you are in a regular room).    Special instructions:    Oral Hygiene is also important to reduce your risk of infection.  Remember - BRUSH YOUR TEETH THE MORNING OF SURGERY WITH YOUR REGULAR TOOTHPASTE   [place]- Preparing For Surgery  Before surgery, you can play an important role. Because skin is not sterile, your skin needs to be as free of germs as possible. You can reduce the number of germs on your skin by washing with CHG (chlorahexidine gluconate) Soap before surgery.  CHG is an antiseptic cleaner which kills germs and bonds with the skin to continue killing germs even after washing.     Please do not use if you have an allergy to CHG or antibacterial soaps. If your skin becomes reddened/irritated stop using the CHG.  Do not shave (including legs and underarms) for at least 48 hours prior to first CHG shower. It is OK to shave your face.  Please follow these instructions carefully.     Shower the [employer]  SURGERY and the MORNING OF SURGERY with CHG Soap.   If you chose to wash your hair, wash your hair first as usual with your normal shampoo. After you shampoo, rinse your hair and body thoroughly to remove the shampoo.  Then [employer] and genitals (private parts) with your normal soap and rinse thoroughly to remove soap.  After that Use CHG Soap as you would any other liquid soap. You can apply CHG directly to the skin and wash gently with a scrungie or a clean washcloth.   Apply the CHG Soap to your body  ONLY FROM THE NECK DOWN.  Do not use on open wounds or open sores. Avoid contact with your eyes, ears, mouth and genitals (private parts). Wash Face and genitals (private parts)  with your normal soap.   Wash thoroughly, paying special attention to the area where your surgery will be performed.  Thoroughly rinse your body with warm water from the neck down.  DO NOT shower/wash with your normal soap after using and rinsing off the CHG Soap.  Pat yourself dry with a CLEAN TOWEL.  Wear CLEAN PAJAMAS to bed the night before surgery  Place CLEAN SHEETS on your bed the night before your surgery  DO NOT SLEEP WITH PETS.   Day of Surgery:  Take a shower with CHG soap. Wear Clean/Comfortable clothing the morning of surgery Do not wear jewelry or makeup. Do not wear lotions, powders, perfumes/cologne or deodorant. Do not shave 48 hours prior to surgery.  Men may shave face and neck. Do not bring valuables to the hospital. Do not wear nail polish, gel polish, artificial nails, or any other type of covering on natural nails (fingers and toes) If you have artificial nails or gel coating that need to be removed by a nail salon, please have this removed prior to surgery. Artificial nails or gel coating may interfere with anesthesia's ability to adequately monitor your vital signs. Remember to brush your teeth WITH YOUR REGULAR TOOTHPASTE.    If you received a COVID test during your pre-op visit, it is requested that you wear a mask when out in public, stay away from anyone that may not be feeling well, and notify your surgeon if you develop symptoms. If you have been in contact with anyone that has tested positive in the last 10 days, please notify your surgeon.    Please read over the following fact sheets that you were given.

## 2022-05-17 ENCOUNTER — Other Ambulatory Visit: Payer: Self-pay

## 2022-05-17 ENCOUNTER — Encounter (HOSPITAL_COMMUNITY)
Admission: RE | Admit: 2022-05-17 | Discharge: 2022-05-17 | Disposition: A | Discharge status: Home / Self Care | Payer: Medicare Other | Source: Ambulatory Visit | Attending: Neurological Surgery | Admitting: Neurological Surgery

## 2022-05-17 ENCOUNTER — Encounter (HOSPITAL_COMMUNITY): Payer: Self-pay

## 2022-05-17 VITALS — BP 98/68 | HR 64 | Temp 97.7°F | Resp 17 | Ht 72.0 in | Wt 208.0 lb

## 2022-05-17 DIAGNOSIS — I2510 Atherosclerotic heart disease of native coronary artery without angina pectoris: Secondary | ICD-10-CM | POA: Diagnosis not present

## 2022-05-17 DIAGNOSIS — Z01818 Encounter for other preprocedural examination: Secondary | ICD-10-CM | POA: Diagnosis not present

## 2022-05-17 LAB — SURGICAL PCR SCREEN
MRSA, PCR: NEGATIVE
Staphylococcus aureus: NEGATIVE

## 2022-05-17 LAB — BASIC METABOLIC PANEL
Anion gap: 8 (ref 5–15)
BUN: 35 mg/dL — ABNORMAL HIGH (ref 8–23)
CO2: 27 mmol/L (ref 22–32)
Calcium: 9 mg/dL (ref 8.9–10.3)
Chloride: 105 mmol/L (ref 98–111)
Creatinine, Ser: 1.06 mg/dL (ref 0.61–1.24)
GFR, Estimated: >60 mL/min (ref >60)
Glucose, Bld: 87 mg/dL (ref 70–99)
Potassium: 3.7 mmol/L (ref 3.5–5.1)
Sodium: 140 mmol/L (ref 135–145)

## 2022-05-17 LAB — TYPE AND SCREEN
ABO/RH(D): O POS
Antibody Screen: NEGATIVE

## 2022-05-17 LAB — CBC
HCT: 46.3 % (ref 39.0–52.0)
Hemoglobin: 15.6 g/dL (ref 13.0–17.0)
MCH: 31.1 pg (ref 26.0–34.0)
MCHC: 33.7 g/dL (ref 30.0–36.0)
MCV: 92.4 fL (ref 80.0–100.0)
Platelets: 275 10*3/uL (ref 150–400)
RBC: 5.01 MIL/uL (ref 4.22–5.81)
RDW: 15 % (ref 11.5–15.5)
WBC: 8.3 10*3/uL (ref 4.0–10.5)
nRBC: 0 % (ref 0.0–0.2)

## 2022-05-17 NOTE — Progress Notes (Signed)
PCP - Dr. Jerene Bears Cardiologist - Dr. Phineas Inches  PPM/ICD - n/s  Chest x-ray - n/a EKG - 04/20/22 Stress Test - denies ECHO - denies Cardiac Cath - denies  Sleep Study - denies CPAP - denies  Blood Thinner Instructions: n/a Aspirin Instructions: Pt states he was instructed to stop 5 days pre-op.  ERAS Protcol -Clear liquids until 0430 DOS PRE-SURGERY Ensure or G2- none ordered  COVID TEST- n/a  Anesthesia review: No  Patient denies shortness of breath, fever, cough and chest pain at PAT appointment   All instructions explained to the patient, with a verbal understanding of the material. Patient agrees to go over the instructions while at home for a better understanding. Patient also instructed to self quarantine after being tested for COVID-19. The opportunity to ask questions was provided.

## 2022-06-15 ENCOUNTER — Ambulatory Visit (HOSPITAL_COMMUNITY)
Admission: RE | Admit: 2022-06-15 | Discharge: 2022-06-15 | Disposition: A | Discharge status: Home / Self Care | Payer: Medicare Other | Source: Ambulatory Visit | Attending: Acute Care | Admitting: Acute Care

## 2022-06-15 DIAGNOSIS — J439 Emphysema, unspecified: Secondary | ICD-10-CM | POA: Diagnosis not present

## 2022-06-15 DIAGNOSIS — R911 Solitary pulmonary nodule: Secondary | ICD-10-CM | POA: Insufficient documentation

## 2022-06-15 DIAGNOSIS — R918 Other nonspecific abnormal finding of lung field: Secondary | ICD-10-CM | POA: Diagnosis not present

## 2022-06-16 ENCOUNTER — Encounter (HOSPITAL_COMMUNITY): Payer: Self-pay | Admitting: Neurological Surgery

## 2022-06-17 ENCOUNTER — Other Ambulatory Visit: Payer: Self-pay

## 2022-06-17 NOTE — Progress Notes (Signed)
Spoke with pt for pre-op call. Pt had a PAT appt in August for this surgery but surgery got rescheduled. Pt states he has the pre-op instructions that he was given along with the CHG soap and understands to follow those instructions. I did give him the time of arrival for this surgery date.

## 2022-06-19 NOTE — Anesthesia Preprocedure Evaluation (Signed)
Anesthesia Evaluation  Patient identified by MRN, date of birth, ID band Patient awake    Reviewed: Allergy & Precautions, NPO status , Patient's Chart, lab work & pertinent test results  Airway Mallampati: II  TM Distance: >3 FB Neck ROM: Full    Dental  (+) Teeth Intact, Dental Advisory Given, Caps   Pulmonary COPD, Current SmokerPatient did not abstain from smoking.,    Pulmonary exam normal breath sounds clear to auscultation       Cardiovascular hypertension, Pt. on medications Normal cardiovascular exam Rhythm:Regular Rate:Normal     Neuro/Psych Lumbar radiculopathy  negative psych ROS   GI/Hepatic Neg liver ROS, GERD  ,  Endo/Other  negative endocrine ROS  Renal/GU negative Renal ROS     Musculoskeletal  (+) Arthritis ,   Abdominal   Peds  Hematology negative hematology ROS (+)   Anesthesia Other Findings   Reproductive/Obstetrics                            Anesthesia Physical Anesthesia Plan  ASA: 2  Anesthesia Plan: General   Post-op Pain Management: Tylenol PO (pre-op)* and Ketamine IV*   Induction: Intravenous  PONV Risk Score and Plan: 2 and Midazolam, Propofol infusion, Dexamethasone and Ondansetron  Airway Management Planned: Oral ETT  Additional Equipment:   Intra-op Plan:   Post-operative Plan: Extubation in OR  Informed Consent: I have reviewed the patients History and Physical, chart, labs and discussed the procedure including the risks, benefits and alternatives for the proposed anesthesia with the patient or authorized representative who has indicated his/her understanding and acceptance.     Dental advisory given  Plan Discussed with: CRNA  Anesthesia Plan Comments: (2nd PIV after induction)     Anesthesia Quick Evaluation

## 2022-06-20 ENCOUNTER — Encounter (HOSPITAL_COMMUNITY): Payer: Self-pay | Admitting: Neurological Surgery

## 2022-06-20 ENCOUNTER — Inpatient Hospital Stay (HOSPITAL_COMMUNITY): Payer: Medicare Other

## 2022-06-20 ENCOUNTER — Observation Stay (HOSPITAL_COMMUNITY)
Admission: RE | Admit: 2022-06-20 | Discharge: 2022-06-21 | Disposition: A | Discharge status: Home / Self Care | Payer: Medicare Other | Source: Ambulatory Visit | Attending: Neurological Surgery | Admitting: Neurological Surgery

## 2022-06-20 ENCOUNTER — Other Ambulatory Visit: Payer: Self-pay

## 2022-06-20 ENCOUNTER — Ambulatory Visit (HOSPITAL_COMMUNITY)
Admission: RE | Disposition: A | Discharge status: Home / Self Care | Payer: Self-pay | Source: Ambulatory Visit | Attending: Neurological Surgery

## 2022-06-20 DIAGNOSIS — Z981 Arthrodesis status: Secondary | ICD-10-CM | POA: Diagnosis not present

## 2022-06-20 DIAGNOSIS — M4316 Spondylolisthesis, lumbar region: Secondary | ICD-10-CM

## 2022-06-20 DIAGNOSIS — M4326 Fusion of spine, lumbar region: Secondary | ICD-10-CM | POA: Diagnosis not present

## 2022-06-20 DIAGNOSIS — F17210 Nicotine dependence, cigarettes, uncomplicated: Secondary | ICD-10-CM | POA: Insufficient documentation

## 2022-06-20 DIAGNOSIS — M7138 Other bursal cyst, other site: Secondary | ICD-10-CM | POA: Insufficient documentation

## 2022-06-20 DIAGNOSIS — M48061 Spinal stenosis, lumbar region without neurogenic claudication: Secondary | ICD-10-CM | POA: Diagnosis not present

## 2022-06-20 DIAGNOSIS — I10 Essential (primary) hypertension: Secondary | ICD-10-CM | POA: Diagnosis not present

## 2022-06-20 HISTORY — PX: TRANSFORAMINAL LUMBAR INTERBODY FUSION W/ MIS 2 LEVEL: SHX6146

## 2022-06-20 HISTORY — DX: Personal history of urinary calculi: Z87.442

## 2022-06-20 LAB — BASIC METABOLIC PANEL
Anion gap: 7 (ref 5–15)
BUN: 22 mg/dL (ref 8–23)
CO2: 25 mmol/L (ref 22–32)
Calcium: 8.9 mg/dL (ref 8.9–10.3)
Chloride: 104 mmol/L (ref 98–111)
Creatinine, Ser: 0.82 mg/dL (ref 0.61–1.24)
GFR, Estimated: >60 mL/min (ref >60)
Glucose, Bld: 91 mg/dL (ref 70–99)
Potassium: 3.6 mmol/L (ref 3.5–5.1)
Sodium: 136 mmol/L (ref 135–145)

## 2022-06-20 LAB — CBC
HCT: 45.6 % (ref 39.0–52.0)
Hemoglobin: 15.5 g/dL (ref 13.0–17.0)
MCH: 32.1 pg (ref 26.0–34.0)
MCHC: 34 g/dL (ref 30.0–36.0)
MCV: 94.4 fL (ref 80.0–100.0)
Platelets: 253 10*3/uL (ref 150–400)
RBC: 4.83 MIL/uL (ref 4.22–5.81)
RDW: 14.1 % (ref 11.5–15.5)
WBC: 6.6 10*3/uL (ref 4.0–10.5)
nRBC: 0 % (ref 0.0–0.2)

## 2022-06-20 LAB — SURGICAL PCR SCREEN
MRSA, PCR: NEGATIVE
Staphylococcus aureus: NEGATIVE

## 2022-06-20 LAB — TYPE AND SCREEN
ABO/RH(D): O POS
Antibody Screen: NEGATIVE

## 2022-06-20 SURGERY — MINIMALLY INVASIVE (MIS) TRANSFORAMINAL LUMBAR INTERBODY FUSION (TLIF) 2 LEVEL
Anesthesia: General

## 2022-06-20 MED ORDER — ACETAMINOPHEN 10 MG/ML IV SOLN
INTRAVENOUS | Status: DC | PRN
  Administered 2022-06-20: 1000 mg via INTRAVENOUS

## 2022-06-20 MED ORDER — LIDOCAINE-EPINEPHRINE 1 %-1:100000 IJ SOLN
INTRAMUSCULAR | Status: DC | PRN
  Administered 2022-06-20: 10 mL

## 2022-06-20 MED ORDER — ROCURONIUM BROMIDE 10 MG/ML (PF) SYRINGE
PREFILLED_SYRINGE | INTRAVENOUS | Status: DC | PRN
  Administered 2022-06-20 (×3): 10 mg via INTRAVENOUS
  Administered 2022-06-20: 60 mg via INTRAVENOUS
  Administered 2022-06-20 (×2): 20 mg via INTRAVENOUS

## 2022-06-20 MED ORDER — ORAL CARE MOUTH RINSE: 15.0000 mL | Freq: Once | OROMUCOSAL | Status: AC

## 2022-06-20 MED ORDER — PHENOL 1.4 % MT LIQD: 1.0000 | OROMUCOSAL | Status: DC | PRN

## 2022-06-20 MED ORDER — CHLORHEXIDINE GLUCONATE CLOTH 2 % EX PADS: 6.0000 | MEDICATED_PAD | Freq: Once | CUTANEOUS | Status: DC

## 2022-06-20 MED ORDER — POLYETHYLENE GLYCOL 3350 17 G PO PACK: 17.0000 g | PACK | Freq: Every day | ORAL | Status: DC | PRN

## 2022-06-20 MED ORDER — SODIUM CHLORIDE 0.9% FLUSH
3.0000 mL | Freq: Two times a day (BID) | INTRAVENOUS | Status: DC
  Administered 2022-06-20: 3 mL via INTRAVENOUS

## 2022-06-20 MED ORDER — OXYCODONE HCL 5 MG PO TABS: 5.0000 mg | ORAL_TABLET | ORAL | Status: DC | PRN

## 2022-06-20 MED ORDER — CYCLOBENZAPRINE HCL 10 MG PO TABS
10.0000 mg | ORAL_TABLET | Freq: Three times a day (TID) | ORAL | Status: DC | PRN
  Administered 2022-06-20: 10 mg via ORAL
  Filled 2022-06-20: qty 1

## 2022-06-20 MED ORDER — LACTATED RINGERS IV SOLN: INTRAVENOUS | Status: DC | PRN

## 2022-06-20 MED ORDER — ONDANSETRON HCL 4 MG/2ML IJ SOLN
INTRAMUSCULAR | Status: AC
  Filled 2022-06-20: qty 2

## 2022-06-20 MED ORDER — MIDAZOLAM HCL 2 MG/2ML IJ SOLN
INTRAMUSCULAR | Status: DC | PRN
  Administered 2022-06-20: 2 mg via INTRAVENOUS

## 2022-06-20 MED ORDER — PHENYLEPHRINE 80 MCG/ML (10ML) SYRINGE FOR IV PUSH (FOR BLOOD PRESSURE SUPPORT)
PREFILLED_SYRINGE | INTRAVENOUS | Status: DC | PRN
  Administered 2022-06-20: 160 ug via INTRAVENOUS
  Administered 2022-06-20 (×3): 80 ug via INTRAVENOUS
  Administered 2022-06-20: 160 ug via INTRAVENOUS
  Administered 2022-06-20 (×2): 80 ug via INTRAVENOUS

## 2022-06-20 MED ORDER — PHENYLEPHRINE HCL-NACL 20-0.9 MG/250ML-% IV SOLN
INTRAVENOUS | Status: AC
  Filled 2022-06-20: qty 250

## 2022-06-20 MED ORDER — PROPOFOL 1000 MG/100ML IV EMUL
INTRAVENOUS | Status: AC
Start: 2022-06-20 — End: ?
  Filled 2022-06-20: qty 200

## 2022-06-20 MED ORDER — PROPOFOL 500 MG/50ML IV EMUL
INTRAVENOUS | Status: DC | PRN
  Administered 2022-06-20: 20 ug/kg/min via INTRAVENOUS

## 2022-06-20 MED ORDER — EPHEDRINE 5 MG/ML INJ
INTRAVENOUS | Status: AC
  Filled 2022-06-20: qty 5

## 2022-06-20 MED ORDER — FENTANYL CITRATE (PF) 250 MCG/5ML IJ SOLN
INTRAMUSCULAR | Status: DC | PRN
  Administered 2022-06-20: 100 ug via INTRAVENOUS
  Administered 2022-06-20 (×3): 50 ug via INTRAVENOUS

## 2022-06-20 MED ORDER — ATORVASTATIN CALCIUM 10 MG PO TABS
20.0000 mg | ORAL_TABLET | Freq: Every evening | ORAL | Status: DC
  Administered 2022-06-20: 20 mg via ORAL
  Filled 2022-06-20: qty 2

## 2022-06-20 MED ORDER — ACETAMINOPHEN 500 MG PO TABS
1000.0000 mg | ORAL_TABLET | Freq: Once | ORAL | Status: AC
  Administered 2022-06-20: 1000 mg via ORAL
  Filled 2022-06-20: qty 2

## 2022-06-20 MED ORDER — ONDANSETRON HCL 4 MG/2ML IJ SOLN: 4.0000 mg | Freq: Four times a day (QID) | INTRAMUSCULAR | Status: DC | PRN

## 2022-06-20 MED ORDER — FENTANYL CITRATE (PF) 250 MCG/5ML IJ SOLN
INTRAMUSCULAR | Status: AC
  Filled 2022-06-20: qty 5

## 2022-06-20 MED ORDER — SODIUM CHLORIDE 0.9 % IV SOLN: 250.0000 mL | INTRAVENOUS | Status: DC

## 2022-06-20 MED ORDER — SUGAMMADEX SODIUM 200 MG/2ML IV SOLN
INTRAVENOUS | Status: DC | PRN
  Administered 2022-06-20: 200 mg via INTRAVENOUS

## 2022-06-20 MED ORDER — LIDOCAINE-EPINEPHRINE 1 %-1:100000 IJ SOLN
INTRAMUSCULAR | Status: AC
  Filled 2022-06-20: qty 1

## 2022-06-20 MED ORDER — ORAL CARE MOUTH RINSE: 15.0000 mL | Freq: Once | OROMUCOSAL | Status: DC

## 2022-06-20 MED ORDER — ROCURONIUM BROMIDE 10 MG/ML (PF) SYRINGE
PREFILLED_SYRINGE | INTRAVENOUS | Status: AC
  Filled 2022-06-20: qty 10

## 2022-06-20 MED ORDER — LACTATED RINGERS IV SOLN: INTRAVENOUS | Status: DC

## 2022-06-20 MED ORDER — KETAMINE HCL 50 MG/5ML IJ SOSY
PREFILLED_SYRINGE | INTRAMUSCULAR | Status: AC
Start: 2022-06-20 — End: ?
  Filled 2022-06-20: qty 5

## 2022-06-20 MED ORDER — KETAMINE HCL 10 MG/ML IJ SOLN
INTRAMUSCULAR | Status: DC | PRN
  Administered 2022-06-20 (×2): 25 mg via INTRAVENOUS

## 2022-06-20 MED ORDER — GLYCOPYRROLATE PF 0.2 MG/ML IJ SOSY
PREFILLED_SYRINGE | INTRAMUSCULAR | Status: DC | PRN
  Administered 2022-06-20: .1 mg via INTRAVENOUS

## 2022-06-20 MED ORDER — CEFAZOLIN SODIUM-DEXTROSE 2-4 GM/100ML-% IV SOLN
2.0000 g | INTRAVENOUS | Status: AC
  Administered 2022-06-20 (×2): 2 g via INTRAVENOUS
  Filled 2022-06-20: qty 100

## 2022-06-20 MED ORDER — ONDANSETRON HCL 4 MG PO TABS: 4.0000 mg | ORAL_TABLET | Freq: Four times a day (QID) | ORAL | Status: DC | PRN

## 2022-06-20 MED ORDER — LIDOCAINE 2% (20 MG/ML) 5 ML SYRINGE
INTRAMUSCULAR | Status: DC | PRN
  Administered 2022-06-20: 80 mg via INTRAVENOUS

## 2022-06-20 MED ORDER — FENTANYL CITRATE (PF) 100 MCG/2ML IJ SOLN: 25.0000 ug | INTRAMUSCULAR | Status: DC | PRN

## 2022-06-20 MED ORDER — THROMBIN 5000 UNITS EX SOLR
CUTANEOUS | Status: AC
Start: 2022-06-20 — End: ?
  Filled 2022-06-20: qty 5000

## 2022-06-20 MED ORDER — MENTHOL 3 MG MT LOZG: 1.0000 | LOZENGE | OROMUCOSAL | Status: DC | PRN

## 2022-06-20 MED ORDER — PHENYLEPHRINE HCL-NACL 20-0.9 MG/250ML-% IV SOLN
INTRAVENOUS | Status: DC | PRN
  Administered 2022-06-20: 20 ug/min via INTRAVENOUS

## 2022-06-20 MED ORDER — ONDANSETRON HCL 4 MG/2ML IJ SOLN: 4.0000 mg | Freq: Once | INTRAMUSCULAR | Status: DC | PRN

## 2022-06-20 MED ORDER — ACETAMINOPHEN 10 MG/ML IV SOLN
INTRAVENOUS | Status: AC
  Filled 2022-06-20: qty 100

## 2022-06-20 MED ORDER — OXYCODONE HCL 5 MG PO TABS
10.0000 mg | ORAL_TABLET | ORAL | Status: DC | PRN
  Administered 2022-06-20 – 2022-06-21 (×4): 10 mg via ORAL
  Filled 2022-06-20 (×4): qty 2

## 2022-06-20 MED ORDER — ONDANSETRON HCL 4 MG/2ML IJ SOLN
INTRAMUSCULAR | Status: DC | PRN
  Administered 2022-06-20: 4 mg via INTRAVENOUS

## 2022-06-20 MED ORDER — CHLORHEXIDINE GLUCONATE 0.12 % MT SOLN: 15.0000 mL | Freq: Once | OROMUCOSAL | Status: DC

## 2022-06-20 MED ORDER — THROMBIN 5000 UNITS EX SOLR
CUTANEOUS | Status: AC
  Filled 2022-06-20: qty 5000

## 2022-06-20 MED ORDER — 0.9 % SODIUM CHLORIDE (POUR BTL) OPTIME
TOPICAL | Status: DC | PRN
  Administered 2022-06-20: 1000 mL

## 2022-06-20 MED ORDER — ACETAMINOPHEN 325 MG PO TABS: 650.0000 mg | ORAL_TABLET | ORAL | Status: DC | PRN

## 2022-06-20 MED ORDER — THROMBIN 5000 UNITS EX SOLR
OROMUCOSAL | Status: DC | PRN
  Administered 2022-06-20 (×2): 5 mL via TOPICAL

## 2022-06-20 MED ORDER — CEFAZOLIN SODIUM-DEXTROSE 2-4 GM/100ML-% IV SOLN
2.0000 g | Freq: Three times a day (TID) | INTRAVENOUS | Status: AC
  Administered 2022-06-20 – 2022-06-21 (×2): 2 g via INTRAVENOUS
  Filled 2022-06-20 (×2): qty 100

## 2022-06-20 MED ORDER — LISINOPRIL 20 MG PO TABS
20.0000 mg | ORAL_TABLET | Freq: Every day | ORAL | Status: DC
  Administered 2022-06-21: 20 mg via ORAL
  Filled 2022-06-20: qty 1

## 2022-06-20 MED ORDER — ALBUMIN HUMAN 5 % IV SOLN: INTRAVENOUS | Status: DC | PRN

## 2022-06-20 MED ORDER — ACETAMINOPHEN 650 MG RE SUPP: 650.0000 mg | RECTAL | Status: DC | PRN

## 2022-06-20 MED ORDER — PHENYLEPHRINE 80 MCG/ML (10ML) SYRINGE FOR IV PUSH (FOR BLOOD PRESSURE SUPPORT)
PREFILLED_SYRINGE | INTRAVENOUS | Status: AC
  Filled 2022-06-20: qty 10

## 2022-06-20 MED ORDER — DEXAMETHASONE SODIUM PHOSPHATE 10 MG/ML IJ SOLN
INTRAMUSCULAR | Status: DC | PRN
  Administered 2022-06-20: 10 mg via INTRAVENOUS

## 2022-06-20 MED ORDER — PROPOFOL 10 MG/ML IV BOLUS
INTRAVENOUS | Status: DC | PRN
  Administered 2022-06-20: 50 mg via INTRAVENOUS
  Administered 2022-06-20: 100 mg via INTRAVENOUS

## 2022-06-20 MED ORDER — CHLORHEXIDINE GLUCONATE 0.12 % MT SOLN
15.0000 mL | Freq: Once | OROMUCOSAL | Status: AC
Start: 2022-06-20 — End: 2022-06-20
  Administered 2022-06-20: 15 mL via OROMUCOSAL
  Filled 2022-06-20: qty 15

## 2022-06-20 MED ORDER — SODIUM CHLORIDE 0.9% FLUSH: 3.0000 mL | INTRAVENOUS | Status: DC | PRN

## 2022-06-20 MED ORDER — LIDOCAINE 2% (20 MG/ML) 5 ML SYRINGE
INTRAMUSCULAR | Status: AC
  Filled 2022-06-20: qty 5

## 2022-06-20 MED ORDER — DOCUSATE SODIUM 100 MG PO CAPS
100.0000 mg | ORAL_CAPSULE | Freq: Two times a day (BID) | ORAL | Status: DC
  Administered 2022-06-20 – 2022-06-21 (×2): 100 mg via ORAL
  Filled 2022-06-20 (×2): qty 1

## 2022-06-20 MED ORDER — MIDAZOLAM HCL 2 MG/2ML IJ SOLN
INTRAMUSCULAR | Status: AC
  Filled 2022-06-20: qty 2

## 2022-06-20 MED ORDER — HYDROMORPHONE HCL 1 MG/ML IJ SOLN: 1.0000 mg | INTRAMUSCULAR | Status: DC | PRN

## 2022-06-20 MED ORDER — GABAPENTIN 300 MG PO CAPS
600.0000 mg | ORAL_CAPSULE | Freq: Three times a day (TID) | ORAL | Status: DC
  Administered 2022-06-20 – 2022-06-21 (×2): 600 mg via ORAL
  Filled 2022-06-20 (×2): qty 2

## 2022-06-20 MED ORDER — EPHEDRINE SULFATE-NACL 50-0.9 MG/10ML-% IV SOSY
PREFILLED_SYRINGE | INTRAVENOUS | Status: DC | PRN
  Administered 2022-06-20 (×4): 5 mg via INTRAVENOUS

## 2022-06-20 MED ORDER — DEXAMETHASONE SODIUM PHOSPHATE 10 MG/ML IJ SOLN
INTRAMUSCULAR | Status: AC
  Filled 2022-06-20: qty 1

## 2022-06-20 SURGICAL SUPPLY — 76 items
ADH SKN CLS APL DERMABOND .7 (GAUZE/BANDAGES/DRESSINGS) ×2
BAG COUNTER SPONGE SURGICOUNT (BAG) ×2 IMPLANT
BAG SPNG CNTER NS LX DISP (BAG) ×1
BAND INSRT 18 STRL LF DISP RB (MISCELLANEOUS) ×2
BAND RUBBER #18 3X1/16 STRL (MISCELLANEOUS) ×4 IMPLANT
BASKET BONE COLLECTION (BASKET) ×2 IMPLANT
BLADE CLIPPER SURG (BLADE) IMPLANT
BLADE SURG 11 STRL SS (BLADE) ×2 IMPLANT
BUR 14 MATCH 3 (BUR) IMPLANT
BUR MATCHSTICK NEURO 3.0 LAGG (BURR) IMPLANT
BUR MR8 14CM BALL SYMTRI 5 (BUR) IMPLANT
BUR ROUND FLUTED 4 SOFT TCH (BURR) ×2 IMPLANT
BUR ROUND PRECISION 4.0 (BURR) IMPLANT
BURR 14 MATCH 3 (BUR)
BURR MR8 14CM BALL SYMTRI 5 (BUR) ×1
CAGE EXP CATALYFT SHORT 9X22.5 (Cage) IMPLANT
CAGE INTERBODY PL LG 7X26.5X24 (Cage) IMPLANT
CNTNR URN SCR LID CUP LEK RST (MISCELLANEOUS) ×2 IMPLANT
CONT SPEC 4OZ STRL OR WHT (MISCELLANEOUS) ×1
COVER BACK TABLE 60X90IN (DRAPES) ×8 IMPLANT
COVERAGE SUPPORT O-ARM STEALTH (MISCELLANEOUS) ×1 IMPLANT
DERMABOND ADVANCED .7 DNX12 (GAUZE/BANDAGES/DRESSINGS) ×2 IMPLANT
DRAPE C-ARM 42X72 X-RAY (DRAPES) ×2 IMPLANT
DRAPE C-ARMOR (DRAPES) ×2 IMPLANT
DRAPE LAPAROTOMY 100X72X124 (DRAPES) ×2 IMPLANT
DRAPE MICROSCOPE SLANT 54X150 (MISCELLANEOUS) ×2 IMPLANT
DRAPE SCAN PATIENT (DRAPES) ×2 IMPLANT
DRAPE SURG 17X23 STRL (DRAPES) ×4 IMPLANT
ELECT BLADE 6.5 EXT (BLADE) ×2 IMPLANT
ELECT REM PT RETURN 9FT ADLT (ELECTROSURGICAL) ×1
ELECTRODE REM PT RTRN 9FT ADLT (ELECTROSURGICAL) ×2 IMPLANT
EXTENDER TAB GUIDE SV 5.5/6.0 (INSTRUMENTS) IMPLANT
FEE COVERAGE SUPPORT O-ARM (MISCELLANEOUS) IMPLANT
GAUZE 4X4 16PLY ~~LOC~~+RFID DBL (SPONGE) IMPLANT
GAUZE SPONGE 4X4 12PLY STRL (GAUZE/BANDAGES/DRESSINGS) ×2 IMPLANT
GAUZE SPONGE 4X4 16PLY XRAY LF (GAUZE/BANDAGES/DRESSINGS) IMPLANT
GLOVE BIOGEL PI IND STRL 7.0 (GLOVE) IMPLANT
GLOVE BIOGEL PI IND STRL 7.5 (GLOVE) ×2 IMPLANT
GLOVE BIOGEL PI IND STRL 8 (GLOVE) IMPLANT
GLOVE ECLIPSE 7.5 STRL STRAW (GLOVE) ×2 IMPLANT
GLOVE SURG SS PI 6.5 STRL IVOR (GLOVE) IMPLANT
GOWN STRL REUS W/ TWL LRG LVL3 (GOWN DISPOSABLE) ×2 IMPLANT
GOWN STRL REUS W/ TWL XL LVL3 (GOWN DISPOSABLE) IMPLANT
GOWN STRL REUS W/TWL 2XL LVL3 (GOWN DISPOSABLE) IMPLANT
GOWN STRL REUS W/TWL LRG LVL3 (GOWN DISPOSABLE) ×4
GOWN STRL REUS W/TWL XL LVL3 (GOWN DISPOSABLE) ×1
HEMOSTAT POWDER KIT SURGIFOAM (HEMOSTASIS) ×2 IMPLANT
KIT BASIN OR (CUSTOM PROCEDURE TRAY) ×2 IMPLANT
KIT POSITION SURG JACKSON T1 (MISCELLANEOUS) ×2 IMPLANT
KIT TURNOVER KIT B (KITS) IMPLANT
MARKER SPHERE PSV REFLC NDI (MISCELLANEOUS) ×10 IMPLANT
NDL HYPO 18GX1.5 BLUNT FILL (NEEDLE) IMPLANT
NDL SPNL 18GX3.5 QUINCKE PK (NEEDLE) IMPLANT
NEEDLE HYPO 18GX1.5 BLUNT FILL (NEEDLE) IMPLANT
NEEDLE HYPO 22GX1.5 SAFETY (NEEDLE) ×2 IMPLANT
NEEDLE SPNL 18GX3.5 QUINCKE PK (NEEDLE) IMPLANT
NS IRRIG 1000ML POUR BTL (IV SOLUTION) ×2 IMPLANT
PACK LAMINECTOMY NEURO (CUSTOM PROCEDURE TRAY) ×2 IMPLANT
PAD ARMBOARD 7.5X6 YLW CONV (MISCELLANEOUS) ×4 IMPLANT
PIN BONE FIX 100 (PIN) IMPLANT
ROD PERC CCM 5.5X60 (Rod) IMPLANT
SCREW SET 5.5/6.0MM SOLERA (Screw) IMPLANT
SCREW SPINAL IFIX 6.5X45 (Screw) IMPLANT
SPIKE FLUID TRANSFER (MISCELLANEOUS) ×2 IMPLANT
SPONGE T-LAP 4X18 ~~LOC~~+RFID (SPONGE) IMPLANT
SUPPORT TECH COVERAGE MED NAV (MISCELLANEOUS) ×1
SUT MNCRL AB 3-0 PS2 18 (SUTURE) ×2 IMPLANT
SUT MNCRL AB 3-0 PS2 27 (SUTURE) IMPLANT
SUT VIC AB 0 CT1 18XCR BRD8 (SUTURE) IMPLANT
SUT VIC AB 0 CT1 8-18 (SUTURE)
SUT VIC AB 2-0 CP2 18 (SUTURE) ×2 IMPLANT
SYR 3ML LL SCALE MARK (SYRINGE) IMPLANT
TOWEL GREEN STERILE (TOWEL DISPOSABLE) ×2 IMPLANT
TOWEL GREEN STERILE FF (TOWEL DISPOSABLE) ×2 IMPLANT
TRAY FOLEY MTR SLVR 16FR STAT (SET/KITS/TRAYS/PACK) IMPLANT
WATER STERILE IRR 1000ML POUR (IV SOLUTION) ×2 IMPLANT

## 2022-06-20 NOTE — Anesthesia Postprocedure Evaluation (Signed)
Anesthesia Post Note  Patient: Leon Mitchell  Procedure(s) Performed: Lumbar three-four, Lumbar four-five Laminectomies/resection of synovial cyst Minimally Invasive Surgery ,Transforaminal Lumbar interbody Fusion lumbar three-four lumbar four-five Application of O-Arm     Patient location during evaluation: PACU Anesthesia Type: General Level of consciousness: awake and alert Pain management: pain level controlled Vital Signs Assessment: post-procedure vital signs reviewed and stable Respiratory status: spontaneous breathing, nonlabored ventilation, respiratory function stable and patient connected to nasal cannula oxygen Cardiovascular status: blood pressure returned to baseline and stable Postop Assessment: no apparent nausea or vomiting Anesthetic complications: no   No notable events documented.  Last Vitals:  Vitals:   06/20/22 1845 06/20/22 1908  BP: (!) 144/77 (!) 158/90  Pulse: 67 69  Resp: 14 18  Temp:    SpO2: 95% 92%    Last Pain:  Vitals:   06/20/22 2000  TempSrc:   PainSc: [place] Chantia Amalfitano

## 2022-06-20 NOTE — H&P (Signed)
Surgical H&P Update  HPI: 66 y.o. with a history of prior SCI, low back and BLE pain. 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 No changes in health since they were last seen except continued progression of severe symptoms. Still having the above and wishes to proceed with surgery.  PMHx:  Past Medical History:  Diagnosis Date   Bulging lumbar disc    GERD (gastroesophageal reflux disease)    History of kidney stones    Hypertension    Pneumonia    May 2023   FamHx:  Family History  Problem Relation Age of Onset   Hypertension Mother    Hypertension Father    Hypertension Sister    Hypertension Brother    SocHx:  reports that he has been smoking cigarettes. He has a 40.00 pack-year smoking history. He has never been exposed to tobacco smoke. He has never used smokeless tobacco. He reports current alcohol use. He reports that he does not use drugs.  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 Risks, benefits, and alternatives discussed and the patient would like to continue with surgery.  -OR today -3C post-op  Judith Part, MD 06/20/22 10:15 AM

## 2022-06-20 NOTE — Anesthesia Procedure Notes (Addendum)
Procedure Name: Intubation Date/Time: 06/20/2022 10:34 AM  Performed by: Hewitt Blade, CRNAPre-anesthesia Checklist: Patient identified, Emergency Drugs available, Suction available and Patient being monitored Patient Re-evaluated:Patient Re-evaluated prior to induction Oxygen Delivery Method: Circle system utilized Preoxygenation: Pre-oxygenation with 100% oxygen Induction Type: IV induction Ventilation: Mask ventilation without difficulty Laryngoscope Size: Mac and 4 Grade View: Grade I Tube type: Oral Tube size: 7.5 mm Number of attempts: 1 Airway Equipment and Method: Stylet and Oral airway Placement Confirmation: ETT inserted through vocal cords under direct vision, positive ETCO2 and breath sounds checked- equal and bilateral Secured at: 22 cm Tube secured with: Tape Dental Injury: Teeth and Oropharynx as per pre-operative assessment  Future Recommendations: Recommend- induction with short-acting agent, and alternative techniques readily available Comments: Intubated by Tracy

## 2022-06-20 NOTE — Op Note (Signed)
PATIENT: Leon Mitchell  DAY OF SURGERY: 06/20/22   PRE-OPERATIVE DIAGNOSIS:  Lumbar spondylolisthesis, lumbar synovial cyst, lumbar stenosis   POST-OPERATIVE DIAGNOSIS:  Same   PROCEDURE:  L3-4, L4-5 minimally invasive repeat laminectomy and transforaminal lumbar interbody fusion, use of frameless stereotaxy and intra-operative CT scan   SURGEON:  Surgeon(s) and Role:    Judith Part, MD - Primary   ANESTHESIA: ETGA   BRIEF HISTORY: This is a 66 year old man with a prior thoracic SCI that presented with worsening low back and BLE pain. He previously had a decompressive laminectomy with improvement in his claudication symptoms, but they returned along with low back pain. 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 I therefore recommended surgical treatment with decompression and fusion at those levels. This was discussed with the patient as well as risks, benefits, and alternatives and wished to proceed with surgery.   OPERATIVE DETAIL:  The patient was taken to the operating room and placed on the OR table in the prone position. A formal time out was performed with two patient identifiers and confirmed the operative site. Anesthesia was induced by the anesthesia team. The operative site was marked, hair was clipped with surgical clippers, the area was then prepped and draped in a sterile fashion.   A small incision was made over the PSIS and a percutaneous hip pain was placed into the pelvis and connected to a reference array for use with frameless navigation. The field was covered and the O-arm was brought into the field. An intraoperative CT was obtained and transferred to the Stealth station, which was registered to the patient's anatomy using the registration frame. The fit appeared to be acceptable. Stereotactic spinal navigation was utilized throughout the procedure for planning and placement of pedicle screw trajectories. The  pedicles were marked and used to create skin incisions bilaterally. Contralateral to the TLIF, percutaneous pedicle screws were placed at L3, L4 and L5 using stereotactic navigation. These were placed by creating a navigated pilot hole then using a navigated awl-tap, palpated, and the screws were placed. Ipsilateral to the TLIF, this was repeated but the trajectories were created, palpated, tapped, but screws were not placed to keep the screw heads from obscuring visualization during the decompression.  Using stereotactic guidance, a MetRx tube was then docked to the right L3-4 facet and decompression was performed.  An L3-L4 laminectomy was performed by removing the lamina ipsilaterally followed by ligamentum flavum, following this contralaterally to the contralateral lateral recess, and then inferiorly to the ligamentous insertion on the inferior lamina. This was quite difficult, given scar tissue from prior surgery. I did get a durotomy in the right lateral recess, but intact arachnoid without any CSF leak. Of note, this was obviously more decompression than would be needed for instrumentation alone.  A right L3-L4 facetectomy was then performed and the right L3 nerve root was decompressed along its entire course. The disc space was identified, incised, and a discectomy was performed in the standard fashion. Using navigated instruments, the endplates were prepped, bone graft was packed into the disc space, and an expandable cage ([employer]tr[employer]) was packed with autograft and placed into the disc space using navigation. The tube was removed and hemostasis was obtained during its removal.   Using stereotactic navigation, a MetRx tube was then docked to the right L4-5 facet and the above described technique was used to perform a laminectomy at L4-5. Scarring was significantly worse at  L4-5 and quite severe. Due to the strength of the collagen and level of adhesion, I had to resort to using a #11 blade to cut  tissue off of the dura sharply with great care. I was able to do so without a durotomy and resected the scar tissue along with the the synovial cyst, which was again impressively scarred, even internally. I was able to get a good decompression of the thecal sac and fairly good lateral recess decompression. I attempted a contralateral foraminotomy but the scar tissue was too extensive and relied instead on indirect decompression.   The tube was then wanded medially and the above technique was repeated at L4-5 to perform a right sided L4-5 TLIF.   Ipsilateral instrumentation was then completed by placing the screws into the previously created trajectories on the right at L3, L4, and L5. A rod was sized and introduced on both sides then final tightened. A final xray was used to confirm that the hardware was in good position. Hemostasis was again confirmed for both incisions, they were copiously irrigated, and then closed in layers. The percutaneous PSIS pin was removed using a slap-hammer and this incision was irrigated and closed in layers as well.   EBL:  321m   DRAINS: none   SPECIMENS: none   TJudith Part MD 06/20/22 10:31 AM

## 2022-06-20 NOTE — Transfer of Care (Signed)
Immediate Anesthesia Transfer of Care Note  Patient: Leon Mitchell  Procedure(s) Performed: Lumbar three-four, Lumbar four-five Laminectomies/resection of synovial cyst Minimally Invasive Surgery ,Transforaminal Lumbar interbody Fusion lumbar three-four lumbar four-five Application of O-Arm  Patient Location: PACU  Anesthesia Type:General  Level of Consciousness: drowsy  Airway & Oxygen Therapy: Patient Spontanous Breathing and Patient connected to face mask oxygen  Post-op Assessment: Report given to RN and Post -op Vital signs reviewed and stable  Post vital signs: Reviewed and stable  Last Vitals:  Vitals Value Taken Time  BP 131/75 06/20/22 1630  Temp    Pulse 57 06/20/22 1632  Resp 17 06/20/22 1632  SpO2 100 % 06/20/22 1632  Vitals shown include unvalidated device data.  Last Pain:  Vitals:   06/20/22 0808  TempSrc:   PainSc: 5       Patients Stated Pain Goal: 0 (83/72/90 2111)  Complications: No notable events documented.

## 2022-06-21 ENCOUNTER — Encounter (HOSPITAL_COMMUNITY): Payer: Self-pay | Admitting: Neurological Surgery

## 2022-06-21 DIAGNOSIS — I10 Essential (primary) hypertension: Secondary | ICD-10-CM | POA: Diagnosis not present

## 2022-06-21 DIAGNOSIS — F17210 Nicotine dependence, cigarettes, uncomplicated: Secondary | ICD-10-CM | POA: Diagnosis not present

## 2022-06-21 DIAGNOSIS — M4316 Spondylolisthesis, lumbar region: Secondary | ICD-10-CM | POA: Diagnosis not present

## 2022-06-21 DIAGNOSIS — M7138 Other bursal cyst, other site: Secondary | ICD-10-CM | POA: Diagnosis not present

## 2022-06-21 DIAGNOSIS — M48061 Spinal stenosis, lumbar region without neurogenic claudication: Secondary | ICD-10-CM | POA: Diagnosis not present

## 2022-06-21 MED ORDER — PHENYLEPHRINE HCL-NACL 20-0.9 MG/250ML-% IV SOLN
INTRAVENOUS | Status: AC
  Filled 2022-06-21: qty 250

## 2022-06-21 MED ORDER — CYCLOBENZAPRINE HCL 10 MG PO TABS: 10.0000 mg | ORAL_TABLET | Freq: Three times a day (TID) | ORAL | 0 refills | Status: DC | PRN

## 2022-06-21 MED ORDER — OXYCODONE HCL 5 MG PO TABS: 5.0000 mg | ORAL_TABLET | ORAL | 0 refills | Status: DC | PRN

## 2022-06-21 MED FILL — Thrombin For Soln 5000 Unit: CUTANEOUS | Qty: 5000 | Status: AC

## 2022-06-21 NOTE — Plan of Care (Signed)
Pt doing well. Pt and wife given D/C instructions with verbal understanding. Rx's were sent to the pharmacy by MD. Pt's incision is clean and dry with no sign of infection. Pt's IV was removed prior to D/C. Pt D/C'd home via wheelchair per MD order. Pt is stable @ D/C and has no other needs at this time. Joslyn Ramos, RN  

## 2022-06-21 NOTE — Care Management CC44 (Signed)
Condition Code 44 Documentation Completed  Patient Details  Name: Leon Mitchell MRN: [id number] Date of Birth: [birth year]   Condition Code 44 given:  Yes Patient signature on Condition Code 44 notice:  Yes Documentation of 2 MD's agreement:  Yes Code 44 added to claim:  Yes    Pollie Friar, RN 06/21/2022, 8:44 AM

## 2022-06-21 NOTE — Evaluation (Signed)
Physical Therapy Evaluation and Discharge Patient Details Name: Leon Mitchell MRN: [id number] DOB: May 23, 1956 Today's Date: 06/21/2022  History of Present Illness  66 yo male presenting with worsening lower back and BLE pain. 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 S/p L3-5 lami and TLIF on 9/11. PMH including HTN and prior SCI.  Clinical Impression   Patient evaluated by Physical Therapy with no further acute PT needs identified. All education has been completed and the patient has no further questions. PT is signing off. Thank you for this referral.        Recommendations for follow up therapy are one component of a multi-disciplinary discharge planning process, led by the attending physician.  Recommendations may be updated based on patient status, additional functional criteria and insurance authorization.  Follow Up Recommendations Follow physician's recommendations for discharge plan and follow up therapies      Assistance Recommended at Discharge Intermittent Supervision/Assistance  Patient can return home with the following  Help with stairs or ramp for entrance    Equipment Recommendations None recommended by PT  Recommendations for Other Services       Functional Status Assessment Patient has had a recent decline in their functional status and demonstrates the ability to make significant improvements in function in a reasonable and predictable amount of time.     Precautions / Restrictions Precautions Precautions: Fall;Back Precaution Booklet Issued: Yes (comment) Precaution Comments: Reviewed precautions and basic mobility Required Braces or Orthoses: Other Brace Other Brace: no brace per MD Restrictions Weight Bearing Restrictions: No      Mobility  Bed Mobility Overal bed mobility: Needs Assistance Bed Mobility: Rolling, Sidelying to Sit Rolling: Supervision Sidelying to sit: Modified independent  (Device/Increase time)       General bed mobility comments: Reviewing log roll technique and cues when rolling    Transfers Overall transfer level: Needs assistance Equipment used: Rolling walker (2 wheels) Transfers: Sit to/from Stand Sit to Stand: Min guard           General transfer comment: safety    Ambulation/Gait Ambulation/Gait assistance: Min guard Gait Distance (Feet): 100 Feet Assistive device: Rolling walker (2 wheels) Gait Pattern/deviations: Step-through pattern, Knee flexed in stance - right, Decreased stride length, Trunk flexed   Gait velocity interpretation: 1.31 - 2.62 ft/sec, indicative of limited community ambulator   General Gait Details: vc for upright posture  Stairs Stairs: Yes Stairs assistance: Min guard Stair Management: Two rails, Step to pattern, Forwards Number of Stairs: 10 General stair comments: pt ascends with his left leg and descends with left leg leading due to arthritis and fear of it buckling  Wheelchair Mobility    Modified Rankin (Stroke Patients Only)       Balance Overall balance assessment: Needs assistance Sitting-balance support: No upper extremity supported, Feet supported Sitting balance-Leahy Scale: Fair     Standing balance support: Bilateral upper extremity supported, During functional activity, Reliant on assistive device for balance Standing balance-Leahy Scale: Poor                               Pertinent Vitals/Pain Pain Assessment Pain Assessment: 0-10 Pain Score: 7  Pain Location: Back Pain Descriptors / Indicators: Discomfort, Grimacing Pain Intervention(s): Limited activity within patient's tolerance, Monitored during session    Home Living Family/patient expects to be discharged to:: Private residence Living Arrangements: Alone Available Help at Discharge: Friend(s);Available  24 hours/day (for one week) Type of Home: House Home Access: Stairs to enter Entrance Stairs-Rails:  Can reach both Entrance Stairs-Number of Steps: 6 Alternate Level Stairs-Number of Steps: flight Home Layout: Two level;Bed/bath upstairs Home Equipment: Rolling Walker (2 wheels);Rollator (4 wheels);Cane - single point;Shower seat;Adaptive equipment;BSC/3in1 Additional Comments: Has cats    Prior Function Prior Level of Function : Independent/Modified Independent                     Hand Dominance        Extremity/Trunk Assessment   Upper Extremity Assessment Upper Extremity Assessment: Defer to OT evaluation RUE Deficits / Details: Baseline limited shoulder ROM RUE Coordination: decreased gross motor    Lower Extremity Assessment Lower Extremity Assessment: RLE deficits/detail;LLE deficits/detail RLE Deficits / Details: incr tone; grossly 4/5 strength LLE Deficits / Details: incr tone; grossly 4+/5    Cervical / Trunk Assessment Cervical / Trunk Assessment: Back Surgery  Communication   Communication: No difficulties  Cognition Arousal/Alertness: Awake/alert Behavior During Therapy: WFL for tasks assessed/performed Overall Cognitive Status: Within Functional Limits for tasks assessed                                          General Comments General comments (skin integrity, edema, etc.): Girlfriend arriving at ned of session    Exercises     Assessment/Plan    PT Assessment Patient does not need any further PT services  PT Problem List         PT Treatment Interventions      PT Goals (Current goals can be found in the Care Plan section)  Acute Rehab PT Goals Patient Stated Goal: go home today PT Goal Formulation: All assessment and education complete, DC therapy    Frequency       Co-evaluation               AM-PAC PT "6 Clicks" Mobility  Outcome Measure Help needed turning from your back to your side while in a flat bed without using bedrails?: A Little Help needed moving from lying on your back to sitting on the  side of a flat bed without using bedrails?: A Little Help needed moving to and from a bed to a chair (including a wheelchair)?: A Little Help needed standing up from a chair using your arms (e.g., wheelchair or bedside chair)?: A Little Help needed to walk in hospital room?: A Little Help needed climbing 3-5 steps with a railing? : A Little 6 Click Score: 18    End of Session Equipment Utilized During Treatment: Gait belt Activity Tolerance: Patient tolerated treatment well Patient left: in bed;Other (comment) (sitting EOB with OT present) Nurse Communication: Mobility status;Other (comment) (ok to discharge) PT Visit Diagnosis: Difficulty in walking, not elsewhere classified (R26.2)    Time: 1610-9604 PT Time Calculation (min) (ACUTE ONLY): 16 min   Charges:   PT Evaluation $PT Eval Low Complexity: [place], PT Acute Rehabilitation Services  Office [phone]   Rexanne Mano 06/21/2022, 11:36 AM

## 2022-06-21 NOTE — Progress Notes (Signed)
Neurosurgery Service Progress Note  Subjective: No acute events overnight, back pain dramatically improved form preop   Objective: Vitals:   06/20/22 1908 06/20/22 2320 06/21/22 0352 06/21/22 0800  BP: (!) 158/90 (!) 140/87 132/72 119/68  Pulse: 69 70 79 84  Resp: '18 18 20 18  '$ Temp:  98.5 F (36.9 C) 98.4 F (36.9 C) 99.1 F (37.3 C)  TempSrc:  Oral Oral Oral  SpO2: 92% 99% 95% 97%  Weight:      Height:        Physical Exam: Strength 5/5 x4 and SILTx4   Assessment & Plan: 66 y.o. man s/p L3-4/4-5 MIS lami / synovial cyst resection / TLIF, recovering well.  -discharge home today  Judith Part  06/21/22 8:18 AM

## 2022-06-21 NOTE — Care Management Obs Status (Signed)
[facility] NOTIFICATION   Patient Details  Name: HANSEL DEVAN MRN: [id number] Date of Birth: May 17, 1956   Medicare Observation Status Notification Given:  Yes    Pollie Friar, RN 06/21/2022, 8:44 AM

## 2022-06-21 NOTE — Evaluation (Signed)
Occupational Therapy Evaluation Patient Details Name: Leon Mitchell MRN: [id number] DOB: 1956/08/05 Today's Date: 06/21/2022   History of Present Illness 66 yo male presenting with worsening lower back and BLE pain. 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 S/p L3-5 lami and TLIF on 9/11. PMH including HTN and prior SCI.   Clinical Impression   PTA, pt was living with alone and was independent; significant other staying at dc for increased support. Currently, pt requires Supervision-Min Guard A for ADLs and functional mobility using RW. Provided education and handout on back precautions, bed mobility, grooming, LB ADLs with AE, toileting, and shower transfer; pt demonstrated understanding. Answered all pt questions. Recommend dc home once medically stable per physician. All acute OT needs met and will sign off. Thank you.    Recommendations for follow up therapy are one component of a multi-disciplinary discharge planning process, led by the attending physician.  Recommendations may be updated based on patient status, additional functional criteria and insurance authorization.   Follow Up Recommendations  No OT follow up    Assistance Recommended at Discharge Frequent or constant Supervision/Assistance  Patient can return home with the following      Functional Status Assessment  Patient has had a recent decline in their functional status and demonstrates the ability to make significant improvements in function in a reasonable and predictable amount of time.  Equipment Recommendations  None recommended by OT    Recommendations for Other Services PT consult     Precautions / Restrictions Precautions Precautions: Fall;Back Precaution Booklet Issued: Yes (comment) Precaution Comments: Reviewed in full Required Braces or Orthoses: Other Brace Other Brace: no brace per MD Restrictions Weight Bearing Restrictions: No       Mobility Bed Mobility               General bed mobility comments: Reviewing log roll technique    Transfers Overall transfer level: Needs assistance Equipment used: Rolling walker (2 wheels) Transfers: Sit to/from Stand Sit to Stand: Min guard           General transfer comment: safety      Balance Overall balance assessment: Needs assistance Sitting-balance support: No upper extremity supported, Feet supported Sitting balance-Leahy Scale: Fair     Standing balance support: Bilateral upper extremity supported, During functional activity Standing balance-Leahy Scale: Poor                             ADL either performed or assessed with clinical judgement   ADL Overall ADL's : Needs assistance/impaired Eating/Feeding: Set up;Sitting   Grooming: Set up;Sitting   Upper Body Bathing: Set up;Sitting   Lower Body Bathing: Min guard;Sit to/from stand   Upper Body Dressing : Set up;Sitting   Lower Body Dressing: Min guard;Sit to/from stand;Cueing for back precautions;With adaptive equipment Lower Body Dressing Details (indicate cue type and reason): Pt unable to peform figure four. Pt demonstrating understanding of reacher, sock aide, and shoe horn. Toilet Transfer: Min guard;Ambulation;Rolling walker (2 wheels)     Toileting - Clothing Manipulation Details (indicate cue type and reason): Educating pt on toilet hygiene     Functional mobility during ADLs: Min guard;Rolling walker (2 wheels) General ADL Comments: Providing education and handout on back precautions, bed mobility, grooming, LB ADLs with AE, toileting, and shower transfer.     Vision         Perception  Praxis      Pertinent Vitals/Pain Pain Assessment Pain Assessment: 0-10 Pain Score: 7  Pain Location: Back Pain Descriptors / Indicators: Discomfort, Grimacing Pain Intervention(s): Monitored during session, Repositioned, Limited activity within patient's tolerance      Hand Dominance     Extremity/Trunk Assessment Upper Extremity Assessment Upper Extremity Assessment: Overall WFL for tasks assessed;RUE deficits/detail RUE Deficits / Details: Baseline limited shoulder ROM RUE Coordination: decreased gross motor   Lower Extremity Assessment Lower Extremity Assessment: Defer to PT evaluation   Cervical / Trunk Assessment Cervical / Trunk Assessment: Back Surgery   Communication Communication Communication: No difficulties   Cognition Arousal/Alertness: Awake/alert Behavior During Therapy: WFL for tasks assessed/performed Overall Cognitive Status: Within Functional Limits for tasks assessed                                       General Comments  Girlfriend arriving at ned of session    Exercises     Shoulder Instructions      Home Living Family/patient expects to be discharged to:: Private residence Living Arrangements: Alone Available Help at Discharge: Friend(s);Available 24 hours/day (for one week) Type of Home: House Home Access: Stairs to enter [employer] of Steps: 6 Entrance Stairs-Rails: Can reach both Home Layout: Two level;Bed/bath upstairs Alternate Level Stairs-Number of Steps: flight   Bathroom Shower/Tub: Occupational psychologist: Handicapped height Bathroom Accessibility: Yes   Home Equipment: Conservation officer, nature (2 wheels);Rollator (4 wheels);Cane - single point;Shower seat;Adaptive equipment;BSC/3in1 Adaptive Equipment: Reacher Additional Comments: Has cats      Prior Functioning/Environment Prior Level of Function : Independent/Modified Independent                        OT Problem List: Decreased strength;Decreased range of motion;Decreased activity tolerance;Impaired balance (sitting and/or standing);Decreased knowledge of use of DME or AE;Decreased knowledge of precautions;Pain      OT Treatment/Interventions:      OT Goals(Current goals can be found in the  care plan section) Acute Rehab OT Goals Patient Stated Goal: Go home OT Goal Formulation: All assessment and education complete, DC therapy  OT Frequency:      Co-evaluation              AM-PAC OT "6 Clicks" Daily Activity     Outcome Measure Help from another person eating meals?: None Help from another person taking care of personal grooming?: A Little Help from another person toileting, which includes using toliet, bedpan, or urinal?: A Little Help from another person bathing (including washing, rinsing, drying)?: A Little Help from another person to put on and taking off regular upper body clothing?: A Little Help from another person to put on and taking off regular lower body clothing?: A Little 6 Click Score: 19   End of Session Equipment Utilized During Treatment: Rolling walker (2 wheels) Nurse Communication: Mobility status  Activity Tolerance: Patient tolerated treatment well Patient left: in chair;with call bell/phone within reach;with family/visitor present  OT Visit Diagnosis: Unsteadiness on feet (R26.81);Other abnormalities of gait and mobility (R26.89);Muscle weakness (generalized) (M62.81);Pain Pain - part of body:  (Back)                Time: 1610-9604 OT Time Calculation (min): 22 min Charges:  OT General Charges $OT Visit: 1 Visit OT Evaluation $OT Eval Low Complexity: 1 Low  Amalya Salmons MSOT, OTR/L Acute  Rehab Office: [place] 06/21/2022, 11:08 AM

## 2022-06-21 NOTE — Discharge Summary (Signed)
Discharge Summary  Date of Admission: 06/20/2022  Date of Discharge: 06/21/22  Attending Physician: Emelda Brothers, MD  Hospital Course: Patient was admitted following an uncomplicated F8-1 / 4-5 MIS lami / TLIF. They were recovered in PACU and transferred to [facility]. Their preop symptoms were significantly improved, their hospital course was uncomplicated and the patient was discharged home on 06/21/22. They will follow up in clinic with me in clinic in 2 weeks.  Neurologic exam at discharge:  Strength 5/5 x4 and SILTx4  Discharge diagnosis: Lumbar spondylolisthesis, lumbar synovial cyst, lumbar radiculopathy  Judith Part, MD 06/21/22 8:15 AM

## 2022-06-22 DIAGNOSIS — Z7982 Long term (current) use of aspirin: Secondary | ICD-10-CM | POA: Diagnosis not present

## 2022-06-22 DIAGNOSIS — K219 Gastro-esophageal reflux disease without esophagitis: Secondary | ICD-10-CM | POA: Diagnosis not present

## 2022-06-22 DIAGNOSIS — Z4789 Encounter for other orthopedic aftercare: Secondary | ICD-10-CM | POA: Diagnosis not present

## 2022-06-22 DIAGNOSIS — I10 Essential (primary) hypertension: Secondary | ICD-10-CM | POA: Diagnosis not present

## 2022-06-22 DIAGNOSIS — Z471 Aftercare following joint replacement surgery: Secondary | ICD-10-CM | POA: Diagnosis not present

## 2022-06-23 DIAGNOSIS — Z299 Encounter for prophylactic measures, unspecified: Secondary | ICD-10-CM | POA: Diagnosis not present

## 2022-06-23 DIAGNOSIS — R300 Dysuria: Secondary | ICD-10-CM | POA: Diagnosis not present

## 2022-06-23 DIAGNOSIS — Z09 Encounter for follow-up examination after completed treatment for conditions other than malignant neoplasm: Secondary | ICD-10-CM | POA: Diagnosis not present

## 2022-06-23 DIAGNOSIS — I10 Essential (primary) hypertension: Secondary | ICD-10-CM | POA: Diagnosis not present

## 2022-06-23 DIAGNOSIS — M545 Low back pain, unspecified: Secondary | ICD-10-CM | POA: Diagnosis not present

## 2022-06-24 DIAGNOSIS — Z7982 Long term (current) use of aspirin: Secondary | ICD-10-CM | POA: Diagnosis not present

## 2022-06-24 DIAGNOSIS — Z4789 Encounter for other orthopedic aftercare: Secondary | ICD-10-CM | POA: Diagnosis not present

## 2022-06-24 DIAGNOSIS — I10 Essential (primary) hypertension: Secondary | ICD-10-CM | POA: Diagnosis not present

## 2022-06-24 DIAGNOSIS — Z471 Aftercare following joint replacement surgery: Secondary | ICD-10-CM | POA: Diagnosis not present

## 2022-06-24 DIAGNOSIS — K219 Gastro-esophageal reflux disease without esophagitis: Secondary | ICD-10-CM | POA: Diagnosis not present

## 2022-06-27 DIAGNOSIS — Z7982 Long term (current) use of aspirin: Secondary | ICD-10-CM | POA: Diagnosis not present

## 2022-06-27 DIAGNOSIS — I10 Essential (primary) hypertension: Secondary | ICD-10-CM | POA: Diagnosis not present

## 2022-06-27 DIAGNOSIS — Z4789 Encounter for other orthopedic aftercare: Secondary | ICD-10-CM | POA: Diagnosis not present

## 2022-06-27 DIAGNOSIS — Z471 Aftercare following joint replacement surgery: Secondary | ICD-10-CM | POA: Diagnosis not present

## 2022-06-27 DIAGNOSIS — K219 Gastro-esophageal reflux disease without esophagitis: Secondary | ICD-10-CM | POA: Diagnosis not present

## 2022-06-29 ENCOUNTER — Encounter (HOSPITAL_COMMUNITY): Payer: Self-pay | Admitting: Neurological Surgery

## 2022-06-29 DIAGNOSIS — N39490 Overflow incontinence: Secondary | ICD-10-CM | POA: Diagnosis not present

## 2022-06-29 DIAGNOSIS — N400 Enlarged prostate without lower urinary tract symptoms: Secondary | ICD-10-CM | POA: Diagnosis not present

## 2022-06-29 DIAGNOSIS — Z299 Encounter for prophylactic measures, unspecified: Secondary | ICD-10-CM | POA: Diagnosis not present

## 2022-06-30 DIAGNOSIS — I10 Essential (primary) hypertension: Secondary | ICD-10-CM | POA: Diagnosis not present

## 2022-06-30 DIAGNOSIS — Z7982 Long term (current) use of aspirin: Secondary | ICD-10-CM | POA: Diagnosis not present

## 2022-06-30 DIAGNOSIS — Z4789 Encounter for other orthopedic aftercare: Secondary | ICD-10-CM | POA: Diagnosis not present

## 2022-06-30 DIAGNOSIS — Z471 Aftercare following joint replacement surgery: Secondary | ICD-10-CM | POA: Diagnosis not present

## 2022-06-30 DIAGNOSIS — K219 Gastro-esophageal reflux disease without esophagitis: Secondary | ICD-10-CM | POA: Diagnosis not present

## 2022-07-04 DIAGNOSIS — Z471 Aftercare following joint replacement surgery: Secondary | ICD-10-CM | POA: Diagnosis not present

## 2022-07-04 DIAGNOSIS — Z4789 Encounter for other orthopedic aftercare: Secondary | ICD-10-CM | POA: Diagnosis not present

## 2022-07-04 DIAGNOSIS — I10 Essential (primary) hypertension: Secondary | ICD-10-CM | POA: Diagnosis not present

## 2022-07-04 DIAGNOSIS — Z7982 Long term (current) use of aspirin: Secondary | ICD-10-CM | POA: Diagnosis not present

## 2022-07-04 DIAGNOSIS — K219 Gastro-esophageal reflux disease without esophagitis: Secondary | ICD-10-CM | POA: Diagnosis not present

## 2022-07-13 DIAGNOSIS — Z299 Encounter for prophylactic measures, unspecified: Secondary | ICD-10-CM | POA: Diagnosis not present

## 2022-07-13 DIAGNOSIS — N400 Enlarged prostate without lower urinary tract symptoms: Secondary | ICD-10-CM | POA: Diagnosis not present

## 2022-07-13 DIAGNOSIS — I10 Essential (primary) hypertension: Secondary | ICD-10-CM | POA: Diagnosis not present

## 2022-09-07 DIAGNOSIS — M419 Scoliosis, unspecified: Secondary | ICD-10-CM | POA: Diagnosis not present

## 2022-09-07 DIAGNOSIS — M7138 Other bursal cyst, other site: Secondary | ICD-10-CM | POA: Diagnosis not present

## 2022-09-21 ENCOUNTER — Ambulatory Visit (HOSPITAL_COMMUNITY)
Admission: RE | Admit: 2022-09-21 | Discharge: 2022-09-21 | Disposition: A | Discharge status: Home / Self Care | Payer: Medicare Other | Source: Ambulatory Visit | Attending: Internal Medicine | Admitting: Internal Medicine

## 2022-09-21 DIAGNOSIS — Z87891 Personal history of nicotine dependence: Secondary | ICD-10-CM | POA: Diagnosis not present

## 2022-09-21 DIAGNOSIS — F17200 Nicotine dependence, unspecified, uncomplicated: Secondary | ICD-10-CM | POA: Diagnosis not present

## 2022-09-21 DIAGNOSIS — F17210 Nicotine dependence, cigarettes, uncomplicated: Secondary | ICD-10-CM | POA: Insufficient documentation

## 2022-09-22 ENCOUNTER — Telehealth: Payer: Self-pay | Admitting: Acute Care

## 2022-09-22 DIAGNOSIS — R911 Solitary pulmonary nodule: Secondary | ICD-10-CM

## 2022-09-22 DIAGNOSIS — Z87891 Personal history of nicotine dependence: Secondary | ICD-10-CM

## 2022-09-22 NOTE — Telephone Encounter (Signed)
Calling to speak with nurse regarding call report.  CB# [phone]

## 2022-09-22 NOTE — Telephone Encounter (Signed)
Called Sheryl at [facility] Radiology:  IMPRESSION: 1. Lung-RADS 2, benign appearance or behavior. Continue annual screening with low-dose chest CT without contrast in 12 months. 2.  Emphysema (ICD10-J43.9) and Aortic Atherosclerosis (ICD10-170.0)

## 2022-09-23 NOTE — Telephone Encounter (Unsigned)
I have called the patient with the results of his low-dose screening CT.  This scan was a 71-monthfollow-up scan to a scan that was done in September 2023.  Scan shows a new spiculated appearing lesion in the right upper lobe which is likely fluid in  density and is seen at the site of a thin-walled cyst noted in the 06/15/2022 CT and a large cavitary pneumonia noted in the 01/20/2022 CT.  Per radiology these findings are strongly favored to be postinfectious as they have developed within a short-term interval since 06/15/2022.  By size criteria they are lung RADS 4B. Radiology recommended a 1 month follow-up, I have messaged Dr. IValeta Harmsas I wanted him to look at it.  Plan will be for a 1 month follow up. If this is not better at that time , we will PET scan the area.  DLangley Gauss please order a 1 momnth CT Chest without contrast and fax results with plan to PCP. Thanks so much

## 2022-09-27 ENCOUNTER — Ambulatory Visit: Payer: Medicare Other | Admitting: Internal Medicine

## 2022-09-27 NOTE — Telephone Encounter (Signed)
Results/plan faxed to PCP. Order placed for CT chest wo contrast due one month

## 2022-10-14 DIAGNOSIS — M1611 Unilateral primary osteoarthritis, right hip: Secondary | ICD-10-CM | POA: Diagnosis not present

## 2022-10-14 DIAGNOSIS — M170 Bilateral primary osteoarthritis of knee: Secondary | ICD-10-CM | POA: Diagnosis not present

## 2022-10-14 DIAGNOSIS — M25551 Pain in right hip: Secondary | ICD-10-CM | POA: Diagnosis not present

## 2022-10-18 ENCOUNTER — Inpatient Hospital Stay (HOSPITAL_COMMUNITY)
Admission: RE | Admit: 2022-10-18 | Discharge: 2022-10-19 | DRG: 322 | Disposition: A | Discharge status: Home / Self Care | Payer: Medicare Other | Source: Ambulatory Visit | Attending: Cardiology | Admitting: Cardiology

## 2022-10-18 DIAGNOSIS — Z7982 Long term (current) use of aspirin: Secondary | ICD-10-CM

## 2022-10-18 DIAGNOSIS — I2129 ST elevation (STEMI) myocardial infarction involving other sites: Secondary | ICD-10-CM | POA: Diagnosis not present

## 2022-10-18 DIAGNOSIS — Z9889 Other specified postprocedural states: Secondary | ICD-10-CM | POA: Diagnosis not present

## 2022-10-18 DIAGNOSIS — G8929 Other chronic pain: Secondary | ICD-10-CM | POA: Diagnosis not present

## 2022-10-18 DIAGNOSIS — M549 Dorsalgia, unspecified: Secondary | ICD-10-CM | POA: Diagnosis present

## 2022-10-18 DIAGNOSIS — I351 Nonrheumatic aortic (valve) insufficiency: Secondary | ICD-10-CM | POA: Diagnosis not present

## 2022-10-18 DIAGNOSIS — K219 Gastro-esophageal reflux disease without esophagitis: Secondary | ICD-10-CM | POA: Diagnosis not present

## 2022-10-18 DIAGNOSIS — Z981 Arthrodesis status: Secondary | ICD-10-CM

## 2022-10-18 DIAGNOSIS — Z720 Tobacco use: Secondary | ICD-10-CM | POA: Diagnosis not present

## 2022-10-18 DIAGNOSIS — I10 Essential (primary) hypertension: Secondary | ICD-10-CM | POA: Diagnosis not present

## 2022-10-18 DIAGNOSIS — Z79899 Other long term (current) drug therapy: Secondary | ICD-10-CM

## 2022-10-18 DIAGNOSIS — I25110 Atherosclerotic heart disease of native coronary artery with unstable angina pectoris: Secondary | ICD-10-CM | POA: Diagnosis not present

## 2022-10-18 DIAGNOSIS — R0789 Other chest pain: Secondary | ICD-10-CM | POA: Diagnosis not present

## 2022-10-18 DIAGNOSIS — Z8249 Family history of ischemic heart disease and other diseases of the circulatory system: Secondary | ICD-10-CM

## 2022-10-18 DIAGNOSIS — F17210 Nicotine dependence, cigarettes, uncomplicated: Secondary | ICD-10-CM | POA: Diagnosis not present

## 2022-10-18 DIAGNOSIS — I2119 ST elevation (STEMI) myocardial infarction involving other coronary artery of inferior wall: Secondary | ICD-10-CM | POA: Diagnosis not present

## 2022-10-18 DIAGNOSIS — Z955 Presence of coronary angioplasty implant and graft: Secondary | ICD-10-CM

## 2022-10-18 DIAGNOSIS — F17200 Nicotine dependence, unspecified, uncomplicated: Secondary | ICD-10-CM

## 2022-10-18 DIAGNOSIS — R9431 Abnormal electrocardiogram [ECG] [EKG]: Secondary | ICD-10-CM | POA: Diagnosis not present

## 2022-10-18 DIAGNOSIS — R079 Chest pain, unspecified: Secondary | ICD-10-CM | POA: Diagnosis not present

## 2022-10-18 DIAGNOSIS — E7800 Pure hypercholesterolemia, unspecified: Secondary | ICD-10-CM | POA: Diagnosis present

## 2022-10-18 DIAGNOSIS — R61 Generalized hyperhidrosis: Secondary | ICD-10-CM | POA: Diagnosis not present

## 2022-10-18 LAB — CBC
HCT: 45.1 % (ref 39.0–52.0)
Hemoglobin: 15.5 g/dL (ref 13.0–17.0)
MCH: 31.4 pg (ref 26.0–34.0)
MCHC: 34.4 g/dL (ref 30.0–36.0)
MCV: 91.3 fL (ref 80.0–100.0)
Platelets: 248 10*3/uL (ref 150–400)
RBC: 4.94 MIL/uL (ref 4.22–5.81)
RDW: 14.3 % (ref 11.5–15.5)
WBC: 12.2 10*3/uL — ABNORMAL HIGH (ref 4.0–10.5)
nRBC: 0 % (ref 0.0–0.2)

## 2022-10-18 LAB — PROTIME-INR
INR: 1.1 (ref 0.8–1.2)
Prothrombin Time: 13.6 seconds (ref 11.4–15.2)

## 2022-10-18 LAB — APTT: aPTT: 69 seconds — ABNORMAL HIGH (ref 24–36)

## 2022-10-18 LAB — HEMOGLOBIN A1C
Hgb A1c MFr Bld: 5.1 % (ref 4.8–5.6)
Mean Plasma Glucose: 99.67 mg/dL

## 2022-10-18 MED ORDER — IOHEXOL 350 MG/ML SOLN
INTRAVENOUS | Status: DC | PRN
  Administered 2022-10-18: 150 mL

## 2022-10-18 MED ORDER — LIDOCAINE HCL (PF) 1 % IJ SOLN
INTRAMUSCULAR | Status: DC | PRN
  Administered 2022-10-18: 2 mL

## 2022-10-18 MED ORDER — HEPARIN SODIUM (PORCINE) 1000 UNIT/ML IJ SOLN
INTRAMUSCULAR | Status: AC
  Filled 2022-10-18: qty 10

## 2022-10-18 MED ORDER — FENTANYL CITRATE (PF) 100 MCG/2ML IJ SOLN
INTRAMUSCULAR | Status: AC
  Filled 2022-10-18: qty 2

## 2022-10-18 MED ORDER — HEPARIN (PORCINE) IN NACL 1000-0.9 UT/500ML-% IV SOLN
INTRAVENOUS | Status: DC | PRN
  Administered 2022-10-18 (×2): 500 mL

## 2022-10-18 MED ORDER — VERAPAMIL HCL 2.5 MG/ML IV SOLN
INTRAVENOUS | Status: DC | PRN
  Administered 2022-10-18: 10 mL via INTRA_ARTERIAL

## 2022-10-18 MED ORDER — TICAGRELOR 90 MG PO TABS
ORAL_TABLET | ORAL | Status: AC
  Filled 2022-10-18: qty 2

## 2022-10-18 MED ORDER — FENTANYL CITRATE (PF) 100 MCG/2ML IJ SOLN
INTRAMUSCULAR | Status: DC | PRN
  Administered 2022-10-18: 25 ug via INTRAVENOUS

## 2022-10-18 MED ORDER — LIDOCAINE HCL (PF) 1 % IJ SOLN
INTRAMUSCULAR | Status: AC
  Filled 2022-10-18: qty 30

## 2022-10-18 MED ORDER — CANGRELOR TETRASODIUM 50 MG IV SOLR
INTRAVENOUS | Status: AC
  Filled 2022-10-18: qty 50

## 2022-10-18 MED ORDER — HEPARIN (PORCINE) IN NACL 1000-0.9 UT/500ML-% IV SOLN
INTRAVENOUS | Status: AC
  Filled 2022-10-18: qty 1000

## 2022-10-18 MED ORDER — MIDAZOLAM HCL 2 MG/2ML IJ SOLN
INTRAMUSCULAR | Status: AC
  Filled 2022-10-18: qty 2

## 2022-10-18 MED ORDER — TICAGRELOR 90 MG PO TABS
ORAL_TABLET | ORAL | Status: DC | PRN
  Administered 2022-10-18: 180 mg via ORAL

## 2022-10-18 MED ORDER — HEPARIN SODIUM (PORCINE) 1000 UNIT/ML IJ SOLN
INTRAMUSCULAR | Status: DC | PRN
  Administered 2022-10-18: 2000 [IU] via INTRAVENOUS
  Administered 2022-10-18: 8000 [IU] via INTRAVENOUS

## 2022-10-18 MED ORDER — VERAPAMIL HCL 2.5 MG/ML IV SOLN
INTRAVENOUS | Status: AC
  Filled 2022-10-18: qty 2

## 2022-10-18 MED ORDER — MIDAZOLAM HCL 2 MG/2ML IJ SOLN
INTRAMUSCULAR | Status: DC | PRN
  Administered 2022-10-18: 2 mg via INTRAVENOUS

## 2022-10-19 ENCOUNTER — Encounter (HOSPITAL_COMMUNITY): Payer: Self-pay | Admitting: Cardiology

## 2022-10-19 ENCOUNTER — Other Ambulatory Visit (HOSPITAL_COMMUNITY): Payer: Self-pay

## 2022-10-19 ENCOUNTER — Telehealth: Payer: Self-pay

## 2022-10-19 ENCOUNTER — Other Ambulatory Visit: Payer: Self-pay

## 2022-10-19 ENCOUNTER — Inpatient Hospital Stay (HOSPITAL_COMMUNITY)
Admission: RE | Disposition: A | Discharge status: Home / Self Care | Payer: Self-pay | Source: Ambulatory Visit | Attending: Cardiology

## 2022-10-19 ENCOUNTER — Inpatient Hospital Stay (HOSPITAL_COMMUNITY): Payer: Medicare Other

## 2022-10-19 DIAGNOSIS — F17200 Nicotine dependence, unspecified, uncomplicated: Secondary | ICD-10-CM

## 2022-10-19 DIAGNOSIS — Z981 Arthrodesis status: Secondary | ICD-10-CM | POA: Diagnosis not present

## 2022-10-19 DIAGNOSIS — I2119 ST elevation (STEMI) myocardial infarction involving other coronary artery of inferior wall: Secondary | ICD-10-CM | POA: Diagnosis present

## 2022-10-19 DIAGNOSIS — F17210 Nicotine dependence, cigarettes, uncomplicated: Secondary | ICD-10-CM | POA: Diagnosis present

## 2022-10-19 DIAGNOSIS — E7800 Pure hypercholesterolemia, unspecified: Secondary | ICD-10-CM | POA: Diagnosis present

## 2022-10-19 DIAGNOSIS — M549 Dorsalgia, unspecified: Secondary | ICD-10-CM | POA: Diagnosis present

## 2022-10-19 DIAGNOSIS — I10 Essential (primary) hypertension: Secondary | ICD-10-CM | POA: Diagnosis present

## 2022-10-19 DIAGNOSIS — Z79899 Other long term (current) drug therapy: Secondary | ICD-10-CM | POA: Diagnosis not present

## 2022-10-19 DIAGNOSIS — I2129 ST elevation (STEMI) myocardial infarction involving other sites: Secondary | ICD-10-CM | POA: Diagnosis present

## 2022-10-19 DIAGNOSIS — I351 Nonrheumatic aortic (valve) insufficiency: Secondary | ICD-10-CM | POA: Diagnosis not present

## 2022-10-19 DIAGNOSIS — Z9889 Other specified postprocedural states: Secondary | ICD-10-CM

## 2022-10-19 DIAGNOSIS — K219 Gastro-esophageal reflux disease without esophagitis: Secondary | ICD-10-CM | POA: Diagnosis present

## 2022-10-19 DIAGNOSIS — I25110 Atherosclerotic heart disease of native coronary artery with unstable angina pectoris: Secondary | ICD-10-CM | POA: Diagnosis present

## 2022-10-19 DIAGNOSIS — Z8249 Family history of ischemic heart disease and other diseases of the circulatory system: Secondary | ICD-10-CM | POA: Diagnosis not present

## 2022-10-19 DIAGNOSIS — G8929 Other chronic pain: Secondary | ICD-10-CM | POA: Diagnosis present

## 2022-10-19 DIAGNOSIS — Z7982 Long term (current) use of aspirin: Secondary | ICD-10-CM | POA: Diagnosis not present

## 2022-10-19 HISTORY — PX: LEFT HEART CATH AND CORONARY ANGIOGRAPHY: CATH118249

## 2022-10-19 HISTORY — PX: CORONARY THROMBECTOMY: CATH118304

## 2022-10-19 HISTORY — PX: CORONARY/GRAFT ACUTE MI REVASCULARIZATION: CATH118305

## 2022-10-19 LAB — ECHOCARDIOGRAM COMPLETE
Area-P 1/2: 2.87 cm2
Calc EF: 64.9 %
Height: 71 in
MV M vel: 2.22 m/s
MV Peak grad: 19.7 mmHg
P 1/2 time: 387 msec
Single Plane A2C EF: 58.1 %
Single Plane A4C EF: 69.7 %
Weight: 3449.76 oz

## 2022-10-19 LAB — BASIC METABOLIC PANEL
Anion gap: 10 (ref 5–15)
BUN: 27 mg/dL — ABNORMAL HIGH (ref 8–23)
CO2: 21 mmol/L — ABNORMAL LOW (ref 22–32)
Calcium: 9 mg/dL (ref 8.9–10.3)
Chloride: 106 mmol/L (ref 98–111)
Creatinine, Ser: 1.11 mg/dL (ref 0.61–1.24)
GFR, Estimated: >60 mL/min (ref >60)
Glucose, Bld: 123 mg/dL — ABNORMAL HIGH (ref 70–99)
Potassium: 3.8 mmol/L (ref 3.5–5.1)
Sodium: 137 mmol/L (ref 135–145)

## 2022-10-19 LAB — POCT I-STAT, CHEM 8
BUN: 32 mg/dL — ABNORMAL HIGH (ref 8–23)
Calcium, Ion: 1.22 mmol/L (ref 1.15–1.40)
Chloride: 105 mmol/L (ref 98–111)
Creatinine, Ser: 1.1 mg/dL (ref 0.61–1.24)
Glucose, Bld: 141 mg/dL — ABNORMAL HIGH (ref 70–99)
HCT: 47 % (ref 39.0–52.0)
Hemoglobin: 16 g/dL (ref 13.0–17.0)
Potassium: 3.6 mmol/L (ref 3.5–5.1)
Sodium: 142 mmol/L (ref 135–145)
TCO2: 24 mmol/L (ref 22–32)

## 2022-10-19 LAB — LIPID PANEL
Cholesterol: 211 mg/dL — ABNORMAL HIGH (ref 0–200)
Cholesterol: 203 mg/dL — ABNORMAL HIGH (ref 0–200)
HDL: 35 mg/dL — ABNORMAL LOW (ref >40)
HDL: 41 mg/dL (ref >40)
LDL Cholesterol: 159 mg/dL — ABNORMAL HIGH (ref 0–99)
LDL Cholesterol: 147 mg/dL — ABNORMAL HIGH (ref 0–99)
Total CHOL/HDL Ratio: 6 RATIO
Total CHOL/HDL Ratio: 5 RATIO
Triglycerides: 84 mg/dL (ref <150)
Triglycerides: 75 mg/dL (ref <150)
VLDL: 17 mg/dL (ref 0–40)
VLDL: 15 mg/dL (ref 0–40)

## 2022-10-19 LAB — COMPREHENSIVE METABOLIC PANEL
ALT: 17 U/L (ref 0–44)
AST: 24 U/L (ref 15–41)
Albumin: 3.6 g/dL (ref 3.5–5.0)
Alkaline Phosphatase: 96 U/L (ref 38–126)
Anion gap: 8 (ref 5–15)
BUN: 30 mg/dL — ABNORMAL HIGH (ref 8–23)
CO2: 24 mmol/L (ref 22–32)
Calcium: 8.7 mg/dL — ABNORMAL LOW (ref 8.9–10.3)
Chloride: 106 mmol/L (ref 98–111)
Creatinine, Ser: 1.14 mg/dL (ref 0.61–1.24)
GFR, Estimated: >60 mL/min (ref >60)
Glucose, Bld: 139 mg/dL — ABNORMAL HIGH (ref 70–99)
Potassium: 3.5 mmol/L (ref 3.5–5.1)
Sodium: 138 mmol/L (ref 135–145)
Total Bilirubin: 0.5 mg/dL (ref 0.3–1.2)
Total Protein: 6.3 g/dL — ABNORMAL LOW (ref 6.5–8.1)

## 2022-10-19 LAB — TROPONIN I (HIGH SENSITIVITY)
Troponin I (High Sensitivity): 422 ng/L (ref <18)
Troponin I (High Sensitivity): 10694 ng/L (ref <18)

## 2022-10-19 LAB — MRSA NEXT GEN BY PCR, NASAL: MRSA by PCR Next Gen: DETECTED — AB

## 2022-10-19 LAB — CBC
HCT: 47 % (ref 39.0–52.0)
Hemoglobin: 15.8 g/dL (ref 13.0–17.0)
MCH: 31.2 pg (ref 26.0–34.0)
MCHC: 33.6 g/dL (ref 30.0–36.0)
MCV: 92.7 fL (ref 80.0–100.0)
Platelets: 263 10*3/uL (ref 150–400)
RBC: 5.07 MIL/uL (ref 4.22–5.81)
RDW: 14.4 % (ref 11.5–15.5)
WBC: 11.3 10*3/uL — ABNORMAL HIGH (ref 4.0–10.5)
nRBC: 0 % (ref 0.0–0.2)

## 2022-10-19 LAB — POCT ACTIVATED CLOTTING TIME
Activated Clotting Time: 244 seconds
Activated Clotting Time: 325 seconds

## 2022-10-19 LAB — TSH: TSH: 2.328 u[IU]/mL (ref 0.350–4.500)

## 2022-10-19 SURGERY — CORONARY/GRAFT ACUTE MI REVASCULARIZATION
Anesthesia: LOCAL

## 2022-10-19 MED ORDER — SODIUM CHLORIDE 0.9% FLUSH: 3.0000 mL | INTRAVENOUS | Status: DC | PRN

## 2022-10-19 MED ORDER — PRASUGREL HCL 10 MG PO TABS
10.0000 mg | ORAL_TABLET | Freq: Every day | ORAL | 0 refills | Status: AC
  Filled 2022-10-19: qty 30, 30d supply, fill #0

## 2022-10-19 MED ORDER — CANGRELOR BOLUS VIA INFUSION
INTRAVENOUS | Status: DC | PRN
  Administered 2022-10-19: 3000 ug via INTRAVENOUS

## 2022-10-19 MED ORDER — SODIUM CHLORIDE 0.9 % IV SOLN: 250.0000 mL | INTRAVENOUS | Status: DC | PRN

## 2022-10-19 MED ORDER — EZETIMIBE 10 MG PO TABS
10.0000 mg | ORAL_TABLET | Freq: Every day | ORAL | 1 refills | Status: DC
  Filled 2022-10-19: qty 90, 90d supply, fill #0

## 2022-10-19 MED ORDER — SODIUM CHLORIDE 0.9% FLUSH
3.0000 mL | Freq: Two times a day (BID) | INTRAVENOUS | Status: DC
  Administered 2022-10-19: 3 mL via INTRAVENOUS

## 2022-10-19 MED ORDER — SODIUM CHLORIDE 0.9 % IV SOLN
INTRAVENOUS | Status: AC | PRN
  Administered 2022-10-19: 4 ug/kg/min via INTRAVENOUS

## 2022-10-19 MED ORDER — LISINOPRIL 20 MG PO TABS
20.0000 mg | ORAL_TABLET | Freq: Every day | ORAL | Status: DC
  Administered 2022-10-19: 20 mg via ORAL
  Filled 2022-10-19: qty 1

## 2022-10-19 MED ORDER — LABETALOL HCL 5 MG/ML IV SOLN
10.0000 mg | INTRAVENOUS | Status: AC | PRN
  Administered 2022-10-19 (×2): 10 mg via INTRAVENOUS
  Filled 2022-10-19: qty 4

## 2022-10-19 MED ORDER — MUPIROCIN 2 % EX OINT
1.0000 | TOPICAL_OINTMENT | Freq: Two times a day (BID) | CUTANEOUS | 0 refills | Status: DC
  Filled 2022-10-19: qty 22, 11d supply, fill #0

## 2022-10-19 MED ORDER — NITROGLYCERIN 1 MG/10 ML FOR IR/CATH LAB
INTRA_ARTERIAL | Status: AC
  Filled 2022-10-19: qty 10

## 2022-10-19 MED ORDER — CHLORHEXIDINE GLUCONATE CLOTH 2 % EX PADS: 6.0000 | MEDICATED_PAD | Freq: Every day | CUTANEOUS | Status: DC

## 2022-10-19 MED ORDER — POTASSIUM CHLORIDE CRYS ER 20 MEQ PO TBCR
20.0000 meq | EXTENDED_RELEASE_TABLET | Freq: Once | ORAL | Status: AC
  Administered 2022-10-19: 20 meq via ORAL
  Filled 2022-10-19: qty 1

## 2022-10-19 MED ORDER — CYCLOBENZAPRINE HCL 10 MG PO TABS: 10.0000 mg | ORAL_TABLET | Freq: Three times a day (TID) | ORAL | Status: DC | PRN

## 2022-10-19 MED ORDER — ASPIRIN 81 MG PO TBEC
81.0000 mg | DELAYED_RELEASE_TABLET | Freq: Every morning | ORAL | Status: DC
  Administered 2022-10-19: 81 mg via ORAL
  Filled 2022-10-19: qty 1

## 2022-10-19 MED ORDER — HYDRALAZINE HCL 20 MG/ML IJ SOLN: 5.0000 mg | INTRAMUSCULAR | Status: AC | PRN

## 2022-10-19 MED ORDER — SODIUM CHLORIDE 0.9 % WEIGHT BASED INFUSION: 1.0000 mL/kg/h | INTRAVENOUS | Status: DC

## 2022-10-19 MED ORDER — NITROGLYCERIN 0.4 MG SL SUBL
0.4000 mg | SUBLINGUAL_TABLET | SUBLINGUAL | 3 refills | Status: DC | PRN
  Filled 2022-10-19: qty 25, 7d supply, fill #0

## 2022-10-19 MED ORDER — TICAGRELOR 90 MG PO TABS: 90.0000 mg | ORAL_TABLET | Freq: Two times a day (BID) | ORAL | Status: DC

## 2022-10-19 MED ORDER — ATORVASTATIN CALCIUM 20 MG PO TABS
20.0000 mg | ORAL_TABLET | Freq: Every day | ORAL | 1 refills | Status: DC
  Filled 2022-10-19: qty 90, 90d supply, fill #0

## 2022-10-19 MED ORDER — METOPROLOL SUCCINATE ER 25 MG PO TB24
25.0000 mg | ORAL_TABLET | Freq: Every day | ORAL | 1 refills | Status: DC
  Filled 2022-10-19: qty 90, 90d supply, fill #0

## 2022-10-19 MED ORDER — NICOTINE 21 MG/24HR TD PT24
21.0000 mg | MEDICATED_PATCH | TRANSDERMAL | 0 refills | Status: AC
  Filled 2022-10-19: qty 28, 28d supply, fill #0
  Filled 2022-10-19: qty 30, 30d supply, fill #0

## 2022-10-19 MED ORDER — CHLORHEXIDINE GLUCONATE CLOTH 2 % EX PADS
6.0000 | MEDICATED_PAD | Freq: Every day | CUTANEOUS | Status: DC
  Administered 2022-10-19: 6 via TOPICAL

## 2022-10-19 MED ORDER — ACETAMINOPHEN 325 MG PO TABS: 650.0000 mg | ORAL_TABLET | ORAL | Status: DC | PRN

## 2022-10-19 MED ORDER — PRASUGREL HCL 10 MG PO TABS
60.0000 mg | ORAL_TABLET | Freq: Once | ORAL | Status: AC
  Administered 2022-10-19: 60 mg via ORAL
  Filled 2022-10-19: qty 6

## 2022-10-19 MED ORDER — ROSUVASTATIN CALCIUM 20 MG PO TABS
20.0000 mg | ORAL_TABLET | Freq: Every day | ORAL | Status: DC
  Administered 2022-10-19: 20 mg via ORAL
  Filled 2022-10-19: qty 1

## 2022-10-19 MED ORDER — METOPROLOL SUCCINATE ER 25 MG PO TB24
25.0000 mg | ORAL_TABLET | Freq: Every day | ORAL | Status: DC
  Administered 2022-10-19: 25 mg via ORAL
  Filled 2022-10-19: qty 1

## 2022-10-19 MED ORDER — ONDANSETRON HCL 4 MG/2ML IJ SOLN: 4.0000 mg | Freq: Four times a day (QID) | INTRAMUSCULAR | Status: DC | PRN

## 2022-10-19 MED ORDER — PERFLUTREN LIPID MICROSPHERE
1.0000 mL | INTRAVENOUS | Status: AC | PRN
  Administered 2022-10-19: 6 mL via INTRAVENOUS

## 2022-10-19 MED ORDER — OXYCODONE HCL 5 MG PO TABS: 5.0000 mg | ORAL_TABLET | ORAL | Status: DC | PRN

## 2022-10-19 MED ORDER — GABAPENTIN 300 MG PO CAPS
600.0000 mg | ORAL_CAPSULE | Freq: Three times a day (TID) | ORAL | Status: DC
  Administered 2022-10-19: 600 mg via ORAL
  Filled 2022-10-19: qty 2

## 2022-10-19 MED ORDER — MUPIROCIN 2 % EX OINT
1.0000 | TOPICAL_OINTMENT | Freq: Two times a day (BID) | CUTANEOUS | Status: DC
  Administered 2022-10-19: 1 via NASAL
  Filled 2022-10-19: qty 22

## 2022-10-19 MED ORDER — SODIUM CHLORIDE 0.9 % WEIGHT BASED INFUSION
1.0000 mL/kg/h | INTRAVENOUS | Status: AC
  Administered 2022-10-19: 1 mL/kg/h via INTRAVENOUS

## 2022-10-19 MED FILL — Nitroglycerin IV Soln 100 MCG/ML in D5W: INTRA_ARTERIAL | Qty: 10 | Status: AC

## 2022-10-19 SURGICAL SUPPLY — 19 items
CATH INFINITI 5 FR JL3.5 (CATHETERS) IMPLANT
CATH LAUNCHER 6FR JR4 (CATHETERS) IMPLANT
CATH OPTITORQUE TIG 4.0 5F (CATHETERS) IMPLANT
CATH PRIORITY ONE AC 6F (CATHETERS) IMPLANT
DEVICE RAD COMP TR BAND LRG (VASCULAR PRODUCTS) IMPLANT
GLIDESHEATH SLEND A-KIT 6F 22G (SHEATH) IMPLANT
GUIDEWIRE INQWIRE 1.5J.035X260 (WIRE) IMPLANT
INQWIRE 1.5J .035X260CM (WIRE) ×1
KIT ENCORE 26 ADVANTAGE (KITS) IMPLANT
KIT HEART LEFT (KITS) ×2 IMPLANT
PACK CARDIAC CATHETERIZATION (CUSTOM PROCEDURE TRAY) ×2 IMPLANT
STENT SYNERGY XD 4.0X16 (Permanent Stent) IMPLANT
STENT SYNERGY XD 4.50X12 (Permanent Stent) IMPLANT
SYNERGY XD 4.0X16 (Permanent Stent) ×1 IMPLANT
SYNERGY XD 4.50X12 (Permanent Stent) ×1 IMPLANT
TRANSDUCER W/STOPCOCK (MISCELLANEOUS) ×2 IMPLANT
TUBING CIL FLEX 10 FLL-RA (TUBING) ×2 IMPLANT
WIRE ASAHI PROWATER 180CM (WIRE) IMPLANT
WIRE COUGAR XT STRL 190CM (WIRE) IMPLANT

## 2022-10-19 NOTE — Progress Notes (Signed)
  Echocardiogram 2D Echocardiogram has been performed.  Wynelle Link 10/19/2022, 9:57 AM

## 2022-10-19 NOTE — Telephone Encounter (Unsigned)
Patient needs toc still admitted for Acute ST elevation myocardial infarction (STEMI) of inferolateral wall should be discharged today

## 2022-10-19 NOTE — Discharge Summary (Addendum)
Physician Discharge Summary  Patient ID: JAQUIN COY MRN: [id number] DOB/AGE: 1956/06/23 67 y.o. 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  Normal EKG.  Compared to 10/18/2022, inferior and posterior ST segment changes suggestive of acute injury pattern no longer present.  Echocardiogram 10/19/2022:   1. Left ventricular ejection fraction, by estimation, is 60 to 65%. The left ventricle has normal function. The left ventricle has no regional wall motion abnormalities. Left ventricular diastolic parameters were normal.  2. Right ventricular systolic function is normal. The right ventricular size is normal.  3. The mitral valve is normal in structure. No evidence of mitral valve regurgitation. No evidence of mitral stenosis.  4. The aortic valve is normal in structure. Aortic valve regurgitation is mild. No aortic stenosis is present.  5. The inferior vena cava is normal in size with greater than 50% respiratory variability, suggesting right atrial pressure of 3 mmHg.  Left Heart Catheterization 10/19/22:  LV: LV normal, LVEF 45% with inferior akinesis.  No significant regurgitation. LM: Large-caliber vessel.  Smooth and normal. LAD: Gives origin to large D1 and D2.  Mild disease is noted in the midsegment of the LAD. Cx: Small, mid segment has a 30% mild disease, resulting to large OM1 and OM 2. RCA: Large-caliber vessel and a dominant vessel.  Has moderate tortuosity in the proximal segment at the crux.  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  There is distal embolization in the PL and PDA branches.   Intervention data: Successful  aspiration thrombectomy using priority 1 thrombectomy catheter followed by direct stenting with 4.0 x 16 mm Synergy XD at 20 atmospheric pressure at the proximal circumflex at the proximal, 2 to the proximal edge dissection, covered with a 4.5 by pulmonary meter Synergy XD deployed at 13 atm pressure for 30 seconds then postdilated with the same stent balloon at 16 pressure pressure between the stent starts.   Recommendation: Patient will need dual antiplatelet therapy with aspirin and Brilinta for a period of 1 year.  I will start him on cangrelor infusion until infusion is done, patient did receive 180 mg of Brilinta in the Cath Lab.  150 mcg utilized.     Radiology: CT CHEST LUNG CA SCREEN LOW DOSE W/O CM  09/22/2022 CLINICAL DATA:  Forty-one pack-year current smoker. COMPARISON:  06/15/2022, 01/20/2022 and 09/21/2021.   Cardiovascular: Atherosclerotic calcification of the aorta and coronary arteries. Heart size normal. No pericardial effusion.  2. New spiculated appearing lesion in the right upper lobe is likely fluid in density and is seen at the site of a thin walled cyst on 06/15/2022 and large cavitary pneumonia on 01/20/2022. Findings are strongly favored to be postinfectious in etiology in the short interval from 06/15/2022, with probable fluid filling the residual cyst. However, by size criteria, exam is categorized as Lung-RADS 4B, suspicious. Additional imaging evaluation or consultation with Pulmonology or Thoracic Surgery recommended. Specifically, follow-up CT chest without contrast in 1 month is recommended in initial follow-up evaluation.  3. Aortic atherosclerosis (ICD10-I70.0).  4. Coronary artery calcification.  5.  Emphysema  Hospital Course: DONAVYN FECHER is a 67 y.o. male  patient Caucasian male patient with hypertension, hyperlipidemia, tobacco use disorder,  chronic back pain and cervical and thoracolumbar spinal stenosis, SP interbody fusion and lumbar laminectomy on  06/20/2022, asymptomatic carotid stenosis SP left carotid endarterectomy on 04/19/2022 who was doing well at home until 1.5 hours prior had severe chest pain in the form of heaviness in the middle of the chest with associated shortness of breath and marked diaphoresis. He activated the EMS, and he was brought directly to the cardiac catheterization on emergent basis on 10/18/2022.  He underwent successful revascularization of a very dominant right coronary artery with placement of 2 overlapping DES stents.  The following morning, echocardiogram was performed at the bedside which revealed completely normal wall motion, EKG revealed normalization of ST segment elevation and patient worked with the cardiac rehab without any chest pain or dyspnea and felt hemodynamically stable for discharge.  In view of sooner than expected recovery, complete normalization of wall motion abnormality, very large infarct but did remarkably well in view of LV presentation, it was felt that he was stable for discharge with close monitoring and outpatient follow-up.  Recommendations on discharge: He will need DAPT with aspirin and Effient for 1 year.  He was scheduled for right hip replacement in April 2024, this will be postponed.  Smoking cessation has extensively been discussed with the patient, he appears motivated, nicotine patches were prescribed.  Office visit with TOC has been scheduled with the for follow-up.  He will be seen <7 days.  He is intolerant to Crestor but able to tolerate atorvastatin, as I am concerned about myalgias, we kept the dose of 20 mg of atorvastatin but added Zetia 10 mg daily.  Aggressive lipid management with LDL goal <70 is indicated.  Patient has abnormal CT scan on 09/22/2022, he will follow-up as usual with his PCP regarding this.  He will also need cardiac rehab phase 2 in the outpatient basis.  Discharge Exam:    10/19/2022   12:25 PM 10/19/2022   12:00 PM 10/19/2022   11:00 AM   Vitals with BMI  Pulse 53 51 57     Physical Exam Neck:     Vascular: No carotid bruit (left CEA scar noted) or JVD.  Cardiovascular:     Rate and Rhythm: Normal rate and regular rhythm.     Pulses: Intact distal pulses.     Heart sounds: Normal heart sounds. No murmur heard.    No gallop.  Pulmonary:     Effort: Pulmonary effort is normal.     Breath sounds: Wheezing (occasional scattered) present.  Abdominal:     General: Bowel sounds are normal.     Palpations: Abdomen is soft.  Musculoskeletal:     Right lower leg: No edema.     Left lower leg: No edema.    Labs:   Lab Results  Component Value Date   WBC 11.3 (H) 10/19/2022   HGB 15.8 10/19/2022   HCT 47.0 10/19/2022   MCV 92.7 10/19/2022   PLT 263 10/19/2022    Recent Labs  Lab 10/18/22 2323 10/19/22 0233  NA 138 137  K 3.5 3.8  CL 106 106  CO2 24 21*  BUN 30* 27*  CREATININE 1.14 1.11  CALCIUM 8.7* 9.0  PROT 6.3*  --   BILITOT 0.5  --   ALKPHOS 96  --   ALT 17  --   AST 24  --   GLUCOSE 139* 123*    Lipid Panel     Component Value Date/Time   CHOL 203 (H) 10/19/2022 8144  TRIG 75 10/19/2022 0233   HDL 41 10/19/2022 0233   CHOLHDL 5.0 10/19/2022 0233   VLDL 15 10/19/2022 0233   LDLCALC 147 (H) 10/19/2022 0233   HEMOGLOBIN A1C Lab Results  Component Value Date   HGBA1C 5.1 10/18/2022   MPG 99.67 10/18/2022    Cardiac Panel (last 3 results) Recent Labs    10/18/22 2323 10/19/22 0233  TROPONINIHS 422* 10,694*     TSH Recent Labs    10/19/22 0233  TSH 2.328    FOLLOW UP PLANS AND APPOINTMENTS Discharge Instructions     Amb Referral to Cardiac Rehabilitation   Complete by: As directed    Diagnosis:  Coronary Stents STEMI PTCA     After initial evaluation and assessments completed: Virtual Based Care may be provided alone or in conjunction with Phase 2 Cardiac Rehab based on patient barriers.: Yes   Intensive Cardiac Rehabilitation (ICR) [place] location only OR Traditional  Cardiac Rehabilitation (TCR) *If criteria for ICR are not met will enroll in TCR [facility] only): Yes      Allergies as of 10/19/2022   No Known Allergies      Medication List     STOP taking these medications    diclofenac 75 MG EC tablet Commonly known as: VOLTAREN       TAKE these medications    aspirin EC 81 MG tablet Take 81 mg by mouth in the morning. Swallow whole.   atorvastatin 20 MG tablet Commonly known as: LIPITOR Take 1 tablet (20 mg total) by mouth daily. What changed: when to take this   cyclobenzaprine 10 MG tablet Commonly known as: FLEXERIL Take 1 tablet (10 mg total) by mouth 3 (three) times daily as needed for muscle spasms.   ezetimibe 10 MG tablet Commonly known as: Zetia Take 1 tablet (10 mg total) by mouth daily.   gabapentin 300 MG capsule Commonly known as: NEURONTIN Take 600 mg by mouth 3 (three) times daily.   lisinopril 20 MG tablet Commonly known as: ZESTRIL Take 1 tablet (20 mg total) by mouth daily.   metoprolol succinate 25 MG 24 hr tablet Commonly known as: TOPROL-XL Take 1 tablet (25 mg total) by mouth daily. Start taking on: October 20, 2022   mupirocin ointment 2 % Commonly known as: BACTROBAN Place 1 Application into the nose 2 (two) times daily.   nicotine 21 mg/24hr patch Commonly known as: NICODERM CQ - dosed in mg/24 hours Place 1 patch (21 mg total) onto the skin daily.   nitroGLYCERIN 0.4 MG SL tablet Commonly known as: NITROSTAT Place 1 tablet (0.4 mg total) under the tongue every 5 (five) minutes as needed for up to 25 days for chest pain.   oxyCODONE 5 MG immediate release tablet Commonly known as: Oxy IR/ROXICODONE Take 1 tablet (5 mg total) by mouth every 4 (four) hours as needed for moderate pain ((score 4 to 6)).   prasugrel 10 MG Tabs tablet Commonly known as: EFFIENT Take 1 tablet (10 mg total) by mouth daily.   tadalafil 10 MG tablet Commonly known as: CIALIS Take 10 mg by mouth daily as  needed.   tamsulosin 0.4 MG Caps capsule Commonly known as: FLOMAX Take 0.4 mg by mouth daily.   Vitamin D (Ergocalciferol) 1.25 MG (50000 UNIT) Caps capsule Commonly known as: DRISDOL Take 50,000 Units by mouth every Sunday.        Follow-up Information     Adrian Prows, MD Follow up on 10/27/2022.   Specialty: Cardiology Why:  10:30 AM. Please bring all medications with you Contact information: [place] [address] [phone]                  Adrian Prows, MD, [facility] 10/19/2022, 12:51 PM Office: [phone]

## 2022-10-19 NOTE — Progress Notes (Signed)
CARDIAC REHAB PHASE I   PRE:  Rate/Rhythm: 63 SR    BP: sitting 133/77    SaO2: 96 RA  MODE:  Ambulation: 290 ft   POST:  Rate/Rhythm: 72 SR    BP: sitting 127/78     SaO2: 96 RA  Pt out of bed independently and ambulated with RW due to severe right hip pain/disease. No cardiac sx, tolerated well. To recliner, VSS.   Long discussion with pt regarding ed. Discussed MI, stents, Effient importance, restrictions, smoking cessation, diet, exercise, NTG and CRPII. Pt very receptive. He wants to quit smoking but admits it has been very difficult in the past. Discussed multiple methods and resources. Will refer to AP CRPII. He is limited with ambulation due to hip but encouraged pt to try water at [facility] or recumbent equipment.  [id number]   Yves Dill BS, ACSM-CEP 10/19/2022 11:19 AM

## 2022-10-19 NOTE — H&P (Signed)
CARDIOLOGY ADMIT NOTE   Patient ID: Leon Mitchell MRN: [id number] DOB/AGE: 1955-12-21 67 y.o.  Admit date: 10/18/2022 Primary Physician:  Glenda Chroman, MD  Patient ID: Leon Mitchell, male    DOB: 06/21/1956, 67 y.o.   MRN: [id number]  CC: Chest pain  HPI:    Leon Mitchell  is a 67 y.o. Caucasian male patient with hypertension, hyperlipidemia, tobacco use disorder, chronic back pain and cervical and thoracolumbar spinal stenosis, SP interbody fusion and lumbar laminectomy on 06/20/2022, asymptomatic carotid stenosis SP left carotid endarterectomy on 04/19/2022 who was doing well at home until 1.5 hours prior had severe chest pain in the form of heaviness in the middle of the chest with associated shortness of breath and marked diaphoresis.  He activate the EMS, and he was brought directly to the cardiac catheterization.  Patient still in chest pain, states that his diaphoresis is improved and is no more nauseous.  Patient admits to using Cialis 7-8 hours ago.  In view of ongoing chest pain, he activated EMS and brought to the hospital here at [facility] via [facility].  Past Medical History:  Diagnosis Date   Bulging lumbar disc    GERD (gastroesophageal reflux disease)    History of kidney stones    Hypertension    Pneumonia    May 2023   Past Surgical History:  Procedure Laterality Date   APPENDECTOMY     BACK SURGERY  01/12/2016   T 5&6   BACK SURGERY  04/2016   T 6-9   BACK SURGERY     Feb 2022   COLONOSCOPY N/A 01/06/2016   Procedure: COLONOSCOPY;  Surgeon: Rogene Houston, MD;  [facility]: [facility];  Service: Endoscopy;  Laterality: N/A;  730   ENDARTERECTOMY Left 04/19/2022   Procedure: LEFT CAROTID ENDARTERECTOMY;  Surgeon: Waynetta Sandy, MD;  Location: [place];  Service: Vascular;  Laterality: Left;   HAND SURGERY Left    KNEE ARTHROSCOPY Left 2006   PATCH ANGIOPLASTY Left 04/19/2022   Procedure: PATCH ANGIOPLASTY using Rueben Bash Biologic Patch;   Surgeon: Waynetta Sandy, MD;  Location: [place];  Service: Vascular;  Laterality: Left;   TRANSFORAMINAL LUMBAR INTERBODY FUSION W/ MIS 2 LEVEL N/A 06/20/2022   Procedure: Lumbar three-four, Lumbar four-five Laminectomies/resection of synovial cyst Minimally Invasive Surgery ,Transforaminal Lumbar interbody Fusion lumbar three-four lumbar four-five;  Surgeon: Judith Part, MD;  Location: [place];  Service: Neurosurgery;  Laterality: N/A;   Social History   Socioeconomic History   Marital status: Widowed    Spouse name: Not on file   Number of children: 2   Years of education: 75   Highest education level: Not on file  Occupational History    Comment: Product manager, disability  Tobacco Use   Smoking status: Every Day    Packs/day: 1.00    Years: 40.00    Total pack years: 40.00    Types: Cigarettes    Passive exposure: Never   Smokeless tobacco: Never  Vaping Use   Vaping Use: Never used  Substance and Sexual Activity   Alcohol use: Yes    Comment: very little; occasionally   Drug use: No   Sexual activity: Not on file  Other Topics Concern   Not on file  Social History Narrative   Mother lives with patient   Caffeine use- 3-4 glasses tea   Social Determinants of Health   Financial Resource Strain: Not on file  Food Insecurity: Not  on file  Transportation Needs: Not on file  Physical Activity: Not on file  Stress: Not on file  Social Connections: Not on file  Intimate Partner Violence: Not on file   Family History  Problem Relation Age of Onset   Hypertension Mother    Hypertension Father    Hypertension Sister    Hypertension Brother     ROS  Review of Systems  Cardiovascular:  Positive for chest pain. Negative for dyspnea on exertion and leg swelling.   Objective      06/21/2022    8:00 AM 06/21/2022    3:52 AM 06/20/2022   11:20 PM  Vitals with BMI  Systolic 834 196 222  Diastolic 68 72 87  Pulse 84 79 70     Physical Exam Neck:      Vascular: No carotid bruit or JVD.  Cardiovascular:     Rate and Rhythm: Normal rate and regular rhythm.     Pulses: Intact distal pulses.     Heart sounds: Normal heart sounds. No murmur heard.    No gallop.  Pulmonary:     Effort: Pulmonary effort is normal.     Breath sounds: Normal breath sounds.  Abdominal:     General: Bowel sounds are normal.     Palpations: Abdomen is soft.  Musculoskeletal:     Right lower leg: No edema.     Left lower leg: No edema.    Laboratory examination:   Recent Labs    04/20/22 0321 05/17/22 0932 06/20/22 0755  NA 134* 140 136  K 4.8 3.7 3.6  CL 103 105 104  CO2 '25 27 25  '$ GLUCOSE 114* 87 91  BUN 25* 35* 22  CREATININE 0.98 1.06 0.82  CALCIUM 8.8* 9.0 8.9  GFRNONAA >60 >60 >60   CrCl cannot be calculated (Patient's most recent lab result is older than the maximum 21 days allowed.).     Latest Ref Rng & Units 06/20/2022    7:55 AM 05/17/2022    9:32 AM 04/20/2022    3:21 AM  CMP  Glucose 70 - 99 mg/dL 91  87  114   BUN 8 - 23 mg/dL 22  35  25   Creatinine 0.61 - 1.24 mg/dL 0.82  1.06  0.98   Sodium 135 - 145 mmol/L 136  140  134   Potassium 3.5 - 5.1 mmol/L 3.6  3.7  4.8   Chloride 98 - 111 mmol/L 104  105  103   CO2 22 - 32 mmol/L '25  27  25   '$ Calcium 8.9 - 10.3 mg/dL 8.9  9.0  8.8       Latest Ref Rng & Units 10/18/2022   11:23 PM 06/20/2022    7:55 AM 05/17/2022    9:32 AM  CBC  WBC 4.0 - 10.5 K/uL 12.2  6.6  8.3   Hemoglobin 13.0 - 17.0 g/dL 15.5  15.5  15.6   Hematocrit 39.0 - 52.0 % 45.1  45.6  46.3   Platelets 150 - 400 K/uL 248  253  275    Lipid Panel     Component Value Date/Time   CHOL 192 04/20/2022 0321   TRIG 104 04/20/2022 0321   HDL 34 (L) 04/20/2022 0321   CHOLHDL 5.6 04/20/2022 0321   VLDL 21 04/20/2022 0321   LDLCALC 137 (H) 04/20/2022 0321   HEMOGLOBIN A1C Lab Results  Component Value Date   HGBA1C 5.1 10/18/2022   MPG 99.67 10/18/2022  TSH No results for input(s): "TSH" in the last 8760  hours. BNP (last 3 results) No results for input(s): "BNP" in the last 8760 hours. Cardiac Panel (last 3 results) No results for input(s): "CKTOTAL", "CKMB", "TROPONINIHS", "RELINDX" in the last 72 hours.   Medications and allergies  No Known Allergies   Prior to Admission medications   Medication Sig Start Date End Date Taking? Authorizing Provider  aspirin EC 81 MG tablet Take 81 mg by mouth in the morning. Swallow whole.    [provider]  atorvastatin (LIPITOR) 20 MG tablet Take 1 tablet (20 mg total) by mouth daily. Patient taking differently: Take 20 mg by mouth every evening. 04/20/22   Ulyses Amor, PA-C  cyclobenzaprine (FLEXERIL) 10 MG tablet Take 1 tablet (10 mg total) by mouth 3 (three) times daily as needed for muscle spasms. 06/21/22   Judith Part, MD  diclofenac (VOLTAREN) 75 MG EC tablet Take 75 mg by mouth 2 (two) times daily. 10/18/18   [provider]  gabapentin (NEURONTIN) 300 MG capsule Take 600 mg by mouth 3 (three) times daily.    [provider]  lisinopril (ZESTRIL) 20 MG tablet Take 1 tablet (20 mg total) by mouth daily. 03/28/22   Janina Mayo, MD  oxyCODONE (OXY IR/ROXICODONE) 5 MG immediate release tablet Take 1 tablet (5 mg total) by mouth every 4 (four) hours as needed for moderate pain ((score 4 to 6)). 06/21/22   Judith Part, MD  Vitamin D, Ergocalciferol, (DRISDOL) 1.25 MG (50000 UNIT) CAPS capsule Take 50,000 Units by mouth every Sunday. 05/12/20   [provider]      Current Outpatient Medications  Medication Instructions   aspirin EC 81 mg, Oral, Every morning, Swallow whole.   atorvastatin (LIPITOR) 20 mg, Oral, Daily   cyclobenzaprine (FLEXERIL) 10 mg, Oral, 3 times daily PRN   diclofenac (VOLTAREN) 75 mg, Oral, 2 times daily   gabapentin (NEURONTIN) 600 mg, Oral, 3 times daily   lisinopril (ZESTRIL) 20 mg, Oral, Daily   oxyCODONE (OXY IR/ROXICODONE) 5 mg, Oral, Every 4 hours PRN   Vitamin D  (Ergocalciferol) (DRISDOL) 50,000 Units, Oral, Every Sun    No intake/output data recorded. Total I/O In: -  Out: 400 [Urine:400]    Radiology:  No results found.  Cardiac Studies:   EKG 10/18/2022 at 22: 19 hours: Normal sinus rhythm at rate of 60 bpm, normal axis, inferior and possibly posterior and lateral STEMI Assessment   1.  Acute inferior and possibly posterior STEMI with suggestion of collateral extension 2.  Primary hypertension 3.  Hypercholesterolemia 4.  Tobacco use disorder 5.  Left carotid endarterectomy  Recommendations:   Leon Mitchell is a 67 y.o. Caucasian male patient with hypertension, hyperlipidemia, tobacco use disorder, chronic back pain and cervical and thoracolumbar spinal stenosis, SP interbody fusion and lumbar laminectomy on 06/20/2022, asymptomatic carotid stenosis SP left carotid endarterectomy on 04/19/2022 presenting with chest pain and acute inferior and posterior STEMI possibly lateral extension, directly taken to the cardiac catheter lab.  Patient has not been taking his statins due to myalgias.  Will repeat rate compliance.  Will treat his blood pressure aggressively and discussed regarding smoking cessation as well.  Patient already has vascular disease in the form of left carotid endarterectomy.  This was again reiterated to the patient.  Schedule for cardiac catheterization, and possible angioplasty. We discussed regarding risks, benefits, alternatives to this including stress testing, CTA and continued medical therapy. Patient wants  to proceed. Understands <1-2% risk of death, stroke, MI, urgent CABG, bleeding, infection, renal failure but not limited to these.   Critical care time spent is 35 minutes with coordination of care, discussions with care teams and evaluation of his medical records.   Adrian Prows, MD, [facility] 10/19/2022, 12:17 AM Office: [phone] Fax: [phone] Pager: [phone]

## 2022-10-19 NOTE — TOC Benefit Eligibility Note (Signed)
Patient Teacher, English as a foreign language completed.    The patient is currently admitted and upon discharge could be taking Brilinta 90 mg.  The current 30 day co-pay is $424.44 due to a $545.00 deductible remaining.   The patient is insured through [place], [place] Patient Advocate Specialist [place] Patient Advocate Team Direct Number: [phone]  Fax: [phone]

## 2022-10-20 LAB — LIPOPROTEIN A (LPA): Lipoprotein (a): 40.4 nmol/L — ABNORMAL HIGH (ref <75.0)

## 2022-10-21 ENCOUNTER — Telehealth: Payer: Self-pay

## 2022-10-21 DIAGNOSIS — I714 Abdominal aortic aneurysm, without rupture, unspecified: Secondary | ICD-10-CM | POA: Diagnosis not present

## 2022-10-21 DIAGNOSIS — I10 Essential (primary) hypertension: Secondary | ICD-10-CM | POA: Diagnosis not present

## 2022-10-21 DIAGNOSIS — I700 Atherosclerosis of aorta: Secondary | ICD-10-CM | POA: Diagnosis not present

## 2022-10-21 DIAGNOSIS — I219 Acute myocardial infarction, unspecified: Secondary | ICD-10-CM | POA: Diagnosis not present

## 2022-10-21 DIAGNOSIS — Z09 Encounter for follow-up examination after completed treatment for conditions other than malignant neoplasm: Secondary | ICD-10-CM | POA: Diagnosis not present

## 2022-10-21 NOTE — Telephone Encounter (Signed)
Location of hospitalization: [place] Reason for hospitalization: CP Date of discharge: 10/19/2022 Date of first communication with patient: today Person contacting patient: Me Current symptoms: NA Do you understand why you were in the Hospital: Yes Questions regarding discharge instructions: None Where were you discharged to: Home Medications reviewed: Yes Allergies reviewed: Yes Dietary changes reviewed: Yes. Discussed low fat and low salt diet.  Referals reviewed: NA Activities of Daily Living: Able to with mild limitations Any transportation issues/concerns: None Any patient concerns: None Confirmed importance & date/time of Follow up appt: Yes Confirmed with patient if condition begins to worsen call. Pt was given the office number and encouraged to call back with questions or concerns: Yes

## 2022-10-21 NOTE — Telephone Encounter (Signed)
Called patient for [facility]

## 2022-10-25 DIAGNOSIS — D485 Neoplasm of uncertain behavior of skin: Secondary | ICD-10-CM | POA: Diagnosis not present

## 2022-10-25 DIAGNOSIS — L570 Actinic keratosis: Secondary | ICD-10-CM | POA: Diagnosis not present

## 2022-10-25 DIAGNOSIS — D044 Carcinoma in situ of skin of scalp and neck: Secondary | ICD-10-CM | POA: Diagnosis not present

## 2022-10-27 ENCOUNTER — Ambulatory Visit (HOSPITAL_COMMUNITY)
Admission: RE | Admit: 2022-10-27 | Discharge: 2022-10-27 | Disposition: A | Discharge status: Home / Self Care | Payer: Medicare Other | Source: Ambulatory Visit | Attending: Acute Care | Admitting: Acute Care

## 2022-10-27 ENCOUNTER — Encounter: Payer: Self-pay | Admitting: Cardiology

## 2022-10-27 ENCOUNTER — Ambulatory Visit: Payer: Medicare Other | Admitting: Cardiology

## 2022-10-27 VITALS — BP 129/67 | HR 51 | Resp 15 | Ht 71.0 in | Wt 221.0 lb

## 2022-10-27 DIAGNOSIS — I10 Essential (primary) hypertension: Secondary | ICD-10-CM | POA: Diagnosis not present

## 2022-10-27 DIAGNOSIS — R911 Solitary pulmonary nodule: Secondary | ICD-10-CM | POA: Diagnosis not present

## 2022-10-27 DIAGNOSIS — E7800 Pure hypercholesterolemia, unspecified: Secondary | ICD-10-CM | POA: Diagnosis not present

## 2022-10-27 DIAGNOSIS — I2510 Atherosclerotic heart disease of native coronary artery without angina pectoris: Secondary | ICD-10-CM | POA: Insufficient documentation

## 2022-10-27 DIAGNOSIS — Z87891 Personal history of nicotine dependence: Secondary | ICD-10-CM | POA: Insufficient documentation

## 2022-10-27 DIAGNOSIS — I252 Old myocardial infarction: Secondary | ICD-10-CM

## 2022-10-27 DIAGNOSIS — I6523 Occlusion and stenosis of bilateral carotid arteries: Secondary | ICD-10-CM | POA: Diagnosis not present

## 2022-10-27 DIAGNOSIS — M1611 Unilateral primary osteoarthritis, right hip: Secondary | ICD-10-CM | POA: Diagnosis not present

## 2022-10-27 MED ORDER — TRAMADOL HCL 50 MG PO TABS: 50.0000 mg | ORAL_TABLET | Freq: Two times a day (BID) | ORAL | 1 refills | Status: DC | PRN

## 2022-10-27 NOTE — Progress Notes (Signed)
Primary Physician/Referring:  Glenda Chroman, MD  Patient ID: Leon Mitchell, male    DOB: 01/06/1956, 67 y.o.   MRN: [id number]  Chief Complaint  Patient presents with  . Acute ST elevation myocardial infarction (STEMI) of inferol  . Hospitalization Follow-up  . Bilateral carotid artery stenosis  . New Patient (Initial Visit)   HPI:    Leon Mitchell  is a 67 y.o. Caucasian male patient with hypertension, hyperlipidemia, tobacco use disorder, chronic back pain and cervical and thoracolumbar spinal stenosis, SP interbody fusion and lumbar laminectomy on 06/20/2022, asymptomatic carotid stenosis SP left carotid endarterectomy on 04/19/2022 scented with chest pain in the form of heaviness in the middle of the chest with associated shortness of breath and marked diaphoresis on 10/18/2022, underwent emergent cardiac catheterization and stenting of the right coronary artery.   States that he is tolerating all the medications well.  He has not had any chest pain since hospital discharge.  Having hard time trying to quit smoking and is using nicotine patch, now smoking only 5 cigarettes a day.  Past Medical History:  Diagnosis Date  . Bulging lumbar disc   . GERD (gastroesophageal reflux disease)   . History of kidney stones   . Hypertension   . Pneumonia    May 2023   Past Surgical History:  Procedure Laterality Date  . APPENDECTOMY    . BACK SURGERY  01/12/2016   T 5&6  . BACK SURGERY  04/2016   T 6-9  . BACK SURGERY     Feb 2022  . COLONOSCOPY N/A 01/06/2016   Procedure: COLONOSCOPY;  Surgeon: Rogene Houston, MD;  Location: [facility];  Service: Endoscopy;  Laterality: N/A;  730  . CORONARY THROMBECTOMY N/A 10/19/2022   Procedure: Coronary Thrombectomy;  Surgeon: Adrian Prows, MD;  Location: [facility];  Service: Cardiovascular;  Laterality: N/A;  . CORONARY/GRAFT ACUTE MI REVASCULARIZATION N/A 10/19/2022   Procedure: Coronary/Graft Acute MI Revascularization;  Surgeon: Adrian Prows, MD;  Location: [facility];  Service: Cardiovascular;  Laterality: N/A;  . ENDARTERECTOMY Left 04/19/2022   Procedure: LEFT CAROTID ENDARTERECTOMY;  Surgeon: Waynetta Sandy, MD;  Location: [facility];  Service: Vascular;  Laterality: Left;  . HAND SURGERY Left   . KNEE ARTHROSCOPY Left 2006  . LEFT HEART CATH AND CORONARY ANGIOGRAPHY N/A 10/19/2022   Procedure: LEFT HEART CATH AND CORONARY ANGIOGRAPHY;  Surgeon: Adrian Prows, MD;  Location: [facility];  Service: Cardiovascular;  Laterality: N/A;  . PATCH ANGIOPLASTY Left 04/19/2022   Procedure: PATCH ANGIOPLASTY using Rueben Bash Biologic Patch;  Surgeon: Waynetta Sandy, MD;  Location: [facility];  Service: Vascular;  Laterality: Left;  . TRANSFORAMINAL LUMBAR INTERBODY FUSION W/ MIS 2 LEVEL N/A 06/20/2022   Procedure: Lumbar three-four, Lumbar four-five Laminectomies/resection of synovial cyst Minimally Invasive Surgery ,Transforaminal Lumbar interbody Fusion lumbar three-four lumbar four-five;  Surgeon: Judith Part, MD;  Location: [facility];  Service: Neurosurgery;  Laterality: N/A;   Family History  Problem Relation Age of Onset  . Hypertension Mother   . Hypertension Father   . Hypertension Sister   . Hypertension Brother     Social History   Tobacco Use  . Smoking status: Every Day    Packs/day: 0.50    Years: 40.00    Total pack years: 20.00    Types: Cigarettes    Passive exposure: Never  . Smokeless tobacco: Never  Substance Use Topics  . Alcohol use:  Yes    Comment: very little; occasionally   Marital Status: Widowed  ROS  Review of Systems  Cardiovascular:  Positive for dyspnea on exertion (chronic). Negative for chest pain and leg swelling.  Musculoskeletal:  Positive for joint pain.  Objective      10/27/2022   10:40 AM 10/19/2022   12:25 PM 10/19/2022   12:00 PM  Vitals with BMI  Height '5\' 11"'$     Weight 221 lbs    BMI 25.05    Systolic 397    Diastolic 67    Pulse 51 53 51    SpO2: 98 %  Physical Exam Neck:     Vascular: No carotid bruit or JVD.  Cardiovascular:     Rate and Rhythm: Normal rate and regular rhythm.     Pulses: Intact distal pulses.     Heart sounds: Normal heart sounds. No murmur heard.    No gallop.  Pulmonary:     Effort: Pulmonary effort is normal.     Breath sounds: Normal breath sounds.  Abdominal:     General: Bowel sounds are normal.     Palpations: Abdomen is soft.  Musculoskeletal:     Right lower leg: No edema.     Left lower leg: No edema.   Medications and allergies  No Known Allergies   Medication list   Current Outpatient Medications:  .  aspirin EC 81 MG tablet, Take 81 mg by mouth in the morning. Swallow whole., Disp: , Rfl:  .  atorvastatin (LIPITOR) 20 MG tablet, Take 1 tablet (20 mg total) by mouth daily., Disp: 90 tablet, Rfl: 1 .  cyclobenzaprine (FLEXERIL) 10 MG tablet, Take 1 tablet (10 mg total) by mouth 3 (three) times daily as needed for muscle spasms., Disp: 30 tablet, Rfl: 0 .  ezetimibe (ZETIA) 10 MG tablet, Take 1 tablet (10 mg total) by mouth daily., Disp: 90 tablet, Rfl: 1 .  gabapentin (NEURONTIN) 300 MG capsule, Take 600 mg by mouth 3 (three) times daily., Disp: , Rfl:  .  lisinopril (ZESTRIL) 20 MG tablet, Take 1 tablet (20 mg total) by mouth daily., Disp: 90 tablet, Rfl: 3 .  metoprolol succinate (TOPROL-XL) 25 MG 24 hr tablet, Take 1 tablet (25 mg total) by mouth daily., Disp: 90 tablet, Rfl: 1 .  mupirocin ointment (BACTROBAN) 2 %, Place 1 Application into the nose 2 (two) times daily., Disp: 22 g, Rfl: 0 .  nicotine (NICODERM CQ - DOSED IN MG/24 HOURS) 21 mg/24hr patch, Place 1 patch (21 mg total) onto the skin daily., Disp: 30 patch, Rfl: 0 .  nitroGLYCERIN (NITROSTAT) 0.4 MG SL tablet, Place 1 tablet (0.4 mg total) under the tongue every 5 (five) minutes as needed for up to 25 days for chest pain., Disp: 25 tablet, Rfl: 3 .  prasugrel (EFFIENT) 10 MG TABS tablet, Take 1 tablet (10 mg total)  by mouth daily., Disp: 30 tablet, Rfl: 0 .  tamsulosin (FLOMAX) 0.4 MG CAPS capsule, Take 0.4 mg by mouth daily., Disp: , Rfl:  .  traMADol (ULTRAM) 50 MG tablet, Take 1 tablet (50 mg total) by mouth 2 (two) times daily as needed., Disp: 60 tablet, Rfl: 1 .  Vitamin D, Ergocalciferol, (DRISDOL) 1.25 MG (50000 UNIT) CAPS capsule, Take 50,000 Units by mouth every Sunday., Disp: , Rfl:  .  tadalafil (CIALIS) 10 MG tablet, Take 10 mg by mouth daily as needed. (Patient not taking: Reported on 10/27/2022), Disp: , Rfl:  Laboratory examination:   Recent  Labs    06/20/22 0755 10/18/22 2323 10/18/22 2328 10/19/22 0233  NA 136 138 142 137  K 3.6 3.5 3.6 3.8  CL 104 106 105 106  CO2 25 24  --  21*  GLUCOSE 91 139* 141* 123*  BUN 22 30* 32* 27*  CREATININE 0.82 1.14 1.10 1.11  CALCIUM 8.9 8.7*  --  9.0  GFRNONAA >60 >60  --  >60    Lab Results  Component Value Date   GLUCOSE 123 (H) 10/19/2022   NA 137 10/19/2022   K 3.8 10/19/2022   CL 106 10/19/2022   CO2 21 (L) 10/19/2022   BUN 27 (H) 10/19/2022   CREATININE 1.11 10/19/2022   GFRNONAA >60 10/19/2022   CALCIUM 9.0 10/19/2022   PROT 6.3 (L) 10/18/2022   ALBUMIN 3.6 10/18/2022   BILITOT 0.5 10/18/2022   ALKPHOS 96 10/18/2022   AST 24 10/18/2022   ALT 17 10/18/2022   ANIONGAP 10 10/19/2022      Lab Results  Component Value Date   ALT 17 10/18/2022   AST 24 10/18/2022   ALKPHOS 96 10/18/2022   BILITOT 0.5 10/18/2022       Latest Ref Rng & Units 10/18/2022   11:23 PM 04/07/2022    8:27 AM  Hepatic Function  Total Protein 6.5 - 8.1 g/dL 6.3  6.7   Albumin 3.5 - 5.0 g/dL 3.6  3.5   AST 15 - 41 U/L 24  19   ALT 0 - 44 U/L 17  15   Alk Phosphatase 38 - 126 U/L 96  88   Total Bilirubin 0.3 - 1.2 mg/dL 0.5  0.5    Lipid Panel Recent Labs    04/20/22 0321 10/18/22 2323 10/19/22 0233  CHOL 192 211* 203*  TRIG 104 84 75  LDLCALC 137* 159* 147*  VLDL '21 17 15  '$ HDL 34* 35* 41  CHOLHDL 5.6 6.0 5.0   Lipoprotein (a)  04/21/23 <75.0 nmol/L 40.4 High    HEMOGLOBIN A1C Lab Results  Component Value Date   HGBA1C 5.1 10/18/2022   MPG 99.67 10/18/2022   TSH Recent Labs    10/19/22 0233  TSH 2.328    Radiology:    Cardiac Studies:   Echocardiogram 10/19/2022:   1. Left ventricular ejection fraction, by estimation, is 60 to 65%. The left ventricle has normal function. The left ventricle has no regional wall motion abnormalities. Left ventricular diastolic parameters were normal.  2. Right ventricular systolic function is normal. The right ventricular size is normal.  3. The mitral valve is normal in structure. No evidence of mitral valve regurgitation. No evidence of mitral stenosis.  4. The aortic valve is normal in structure. Aortic valve regurgitation is mild. No aortic stenosis is present.  5. The inferior vena cava is normal in size with greater than 50% respiratory variability, suggesting right atrial pressure of 3 mmHg.   Left Heart Catheterization 10/19/22:  LV: LV normal, LVEF 45% with inferior akinesis.  No significant regurgitation. LM: Large-caliber vessel.  Smooth and normal. LAD: Gives origin to large D1 and D2.  Mild disease is noted in the midsegment of the LAD. Cx: Small, mid segment has a 30% mild disease, resulting to large OM1 and OM 2. RCA: Large-caliber vessel and a dominant vessel.  Has moderate tortuosity in the proximal segment at the crux.  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  There is distal embolization in the PL and PDA branches.   Intervention data:  Successful aspiration thrombectomy using priority 1 thrombectomy catheter followed by direct stenting with 4.0 x 16 mm Synergy XD at 20 atmospheric pressure at the proximal circumflex at the proximal, 2 to the proximal edge dissection, covered with a 4.5 by pulmonary meter Synergy XD deployed at 13 atm pressure for 30 seconds then postdilated with the same stent balloon at 16 pressure pressure between the stent  starts.   Recommendation: Patient will need dual antiplatelet therapy with aspirin and Brilinta for a period of 1 year.  I will start him on cangrelor infusion until infusion is done, patient did receive 180 mg of Brilinta in the Cath Lab.  150 mcg utilized.    EKG:   EKG 10/27/2022: Marked sinus bradycardia at the rate of 49 bpm.  Normal axis, no evidence of ischemia.  Normal EKG.  EKG 10/19/2022: Normal sinus rhythm without evidence of ischemia or infarct. Normal EKG. Compared to 10/18/2022, inferior and posterior ST segment changes suggestive of acute injury pattern no longer present.   Assessment     ICD-10-CM   1. Coronary artery disease involving native coronary artery of native heart without angina pectoris  I25.10 EKG 12-Lead    CBC    CMP14+EGFR    2. History of myocardial infarction 10/18/2022 Inf STEMI  I25.2     3. Pure hypercholesterolemia  E78.00 Lipid Panel With LDL/HDL Ratio    4. Primary hypertension  I10     5. Primary osteoarthritis of right hip  M16.11 traMADol (ULTRAM) 50 MG tablet       Orders Placed This Encounter  Procedures  . CBC  . Lipid Panel With LDL/HDL Ratio  . CMP14+EGFR  . EKG 12-Lead    Meds ordered this encounter  Medications  . traMADol (ULTRAM) 50 MG tablet    Sig: Take 1 tablet (50 mg total) by mouth 2 (two) times daily as needed.    Dispense:  60 tablet    Refill:  1    Medications Discontinued During This Encounter  Medication Reason  . oxyCODONE (OXY IR/ROXICODONE) 5 MG immediate release tablet Patient Preference  . gadopentetate dimeglumine (MAGNEVIST) injection 20 mL      Recommendations:   RENAUD CELLI is a 67 y.o.  Caucasian male patient with hypertension, hyperlipidemia, tobacco use disorder, chronic back pain and cervical and thoracolumbar spinal stenosis, SP interbody fusion and lumbar laminectomy on 06/20/2022, asymptomatic carotid stenosis SP left carotid endarterectomy on 04/19/2022 scented with chest pain in the form of  heaviness in the middle of the chest with associated shortness of breath and marked diaphoresis on 10/18/2022, underwent emergent cardiac catheterization and stenting of the right coronary artery.    1. Coronary artery disease involving native coronary artery of native heart without angina pectoris Patient since hospital discharge has not had any chest pain, he has not used any sublingual nitroglycerin.  He is presently doing well and EKG remains normal with marked sinus bradycardia.  He is asymptomatic with regard to bradycardia hence continue low-dose beta-blocker, he is also on an ACE inhibitor, continue the same along with high intensity statins.  Will obtain CMP and CBC.  2. History of myocardial infarction 10/18/2022 Inf STEMI As stated above, patient has severe primary osteoarthritis of his right hip and he was scheduled for right hip arthroplasty the following morning of myocardial infarction obviously has been canceled and postponed.  He will continue DAPT for now.  Smoking cessation again discussed, he has reduced his smoking to 5 cigarettes a day  and is using nicotine patch.  He has >41-pack-year history of smoking.  3. Pure hypercholesterolemia He needs lipid profile testing, he was started on high intensity statins while in the hospital.  Lab orders placed.  4. Primary hypertension Blood pressure is well-controlled on ACE inhibitor and beta-blocker therapy, continue the same for now.  5. Primary osteoarthritis of right hip Patient has very severe osteoarthritis of his right hip and also left knee, he is in fact using a walker and was scheduled for surgery but this was postponed now due to recent myocardial infarction.  I prescribed him Ultram and advised him to use as little as possible.  He is aware that he is not to use NSAIDs.  I will see him back in 2 months for follow-up.  He will do lab work prior to his next office visit.   Adrian Prows, MD, [facility] 10/27/2022, 11:05 AM Office:  [phone]

## 2022-10-28 ENCOUNTER — Telehealth: Payer: Self-pay | Admitting: Acute Care

## 2022-10-28 ENCOUNTER — Other Ambulatory Visit: Payer: Self-pay | Admitting: Acute Care

## 2022-10-28 DIAGNOSIS — R911 Solitary pulmonary nodule: Secondary | ICD-10-CM

## 2022-10-28 NOTE — Telephone Encounter (Signed)
I have called the patient with the results of his CT Chest. The scan was done as a repeat to a Lung Cancer Screening that was done 12//2023 and recommended a 1 month follow up scan. The repeat scan shows Essentially stable spiculated nodule in the right upper lobe laterally which had previously been a cavitary lesion earlier in 2023 as previously described. Again based on the overall appearance this would have Lung-RADS category 4B. With the persistence could consider a PET-CT scan versus short interval follow up in 3 months.  I discussed this with the patient and he is in agreement with a PET scan now, and them follow up with myself or Dr. Valeta Harms after the scan to review the results and determine plan of care.

## 2022-10-31 DIAGNOSIS — C4442 Squamous cell carcinoma of skin of scalp and neck: Secondary | ICD-10-CM | POA: Diagnosis not present

## 2022-11-09 DIAGNOSIS — I25119 Atherosclerotic heart disease of native coronary artery with unspecified angina pectoris: Secondary | ICD-10-CM | POA: Diagnosis not present

## 2022-11-09 DIAGNOSIS — M159 Polyosteoarthritis, unspecified: Secondary | ICD-10-CM | POA: Diagnosis not present

## 2022-11-09 DIAGNOSIS — F17210 Nicotine dependence, cigarettes, uncomplicated: Secondary | ICD-10-CM | POA: Diagnosis not present

## 2022-11-10 ENCOUNTER — Encounter (HOSPITAL_COMMUNITY)
Admission: RE | Admit: 2022-11-10 | Discharge: 2022-11-10 | Disposition: A | Discharge status: Home / Self Care | Payer: Medicare Other | Source: Ambulatory Visit | Attending: Acute Care | Admitting: Acute Care

## 2022-11-10 DIAGNOSIS — R911 Solitary pulmonary nodule: Secondary | ICD-10-CM | POA: Diagnosis not present

## 2022-11-10 DIAGNOSIS — N289 Disorder of kidney and ureter, unspecified: Secondary | ICD-10-CM | POA: Diagnosis not present

## 2022-11-10 DIAGNOSIS — N200 Calculus of kidney: Secondary | ICD-10-CM | POA: Diagnosis not present

## 2022-11-10 DIAGNOSIS — I714 Abdominal aortic aneurysm, without rupture, unspecified: Secondary | ICD-10-CM | POA: Diagnosis not present

## 2022-11-10 DIAGNOSIS — I2510 Atherosclerotic heart disease of native coronary artery without angina pectoris: Secondary | ICD-10-CM | POA: Diagnosis not present

## 2022-11-10 MED ORDER — FLUDEOXYGLUCOSE F - 18 (FDG) INJECTION
11.6250 | Freq: Once | INTRAVENOUS | Status: AC | PRN
  Administered 2022-11-10: 11.625 via INTRAVENOUS

## 2022-11-15 ENCOUNTER — Ambulatory Visit (INDEPENDENT_AMBULATORY_CARE_PROVIDER_SITE_OTHER): Payer: Medicare Other | Admitting: Acute Care

## 2022-11-15 ENCOUNTER — Encounter (HOSPITAL_COMMUNITY)
Admission: RE | Admit: 2022-11-15 | Discharge: 2022-11-15 | Disposition: A | Discharge status: Home / Self Care | Payer: Medicare Other | Source: Ambulatory Visit | Attending: Cardiology | Admitting: Cardiology

## 2022-11-15 ENCOUNTER — Other Ambulatory Visit: Payer: Self-pay

## 2022-11-15 ENCOUNTER — Encounter: Payer: Self-pay | Admitting: Acute Care

## 2022-11-15 VITALS — BP 124/60 | HR 50 | Ht 71.0 in | Wt 217.4 lb

## 2022-11-15 VITALS — BP 112/68 | HR 50 | Temp 97.8°F | Ht 72.0 in | Wt 218.4 lb

## 2022-11-15 DIAGNOSIS — J984 Other disorders of lung: Secondary | ICD-10-CM | POA: Diagnosis not present

## 2022-11-15 DIAGNOSIS — I2510 Atherosclerotic heart disease of native coronary artery without angina pectoris: Secondary | ICD-10-CM | POA: Diagnosis not present

## 2022-11-15 DIAGNOSIS — R911 Solitary pulmonary nodule: Secondary | ICD-10-CM

## 2022-11-15 DIAGNOSIS — F17210 Nicotine dependence, cigarettes, uncomplicated: Secondary | ICD-10-CM | POA: Diagnosis not present

## 2022-11-15 DIAGNOSIS — Z955 Presence of coronary angioplasty implant and graft: Secondary | ICD-10-CM | POA: Diagnosis not present

## 2022-11-15 DIAGNOSIS — I2119 ST elevation (STEMI) myocardial infarction involving other coronary artery of inferior wall: Secondary | ICD-10-CM | POA: Insufficient documentation

## 2022-11-15 DIAGNOSIS — Z87891 Personal history of nicotine dependence: Secondary | ICD-10-CM

## 2022-11-15 DIAGNOSIS — Z720 Tobacco use: Secondary | ICD-10-CM

## 2022-11-15 NOTE — Progress Notes (Unsigned)
History of Present Illness Leon Mitchell is a 67 y.o. male with ***   11/15/2022 Pt. Presents for follow up after PET scan. He had an abnormal lung cancer screening 09/22/2022.  The scan was read as a LR 4 B. There was a new spiculated  Recommendation was for a follow up CT Chest.  The follow up CT Chest showed   Test Results: PET scan 11/10/2022 Low-level FDG avidity less than that of background blood pool in the spiculated right upper lobe pulmonary nodule measuring 14 x 11 mm, which is nonspecific but in context with prior imaging this is favored to reflect a resolving infectious or inflammatory etiology. However, low-grade malignancy while not favored is not excluded. Suggest continued attention on follow-up imaging. 2. Minimally metabolic prominent/mildly enlarged thoracic lymph nodes, which have been stable dating back to at least July 2124 compatible with a benign finding. 3. No abnormal FDG avidity in the solid 7 mm pulmonary nodule in the paramedian right upper lobe, although below the resolution of PET-CT and is stable dating back to at least July 21, compatible with a benign finding. 4. 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 Recommend follow-up every 12 months and vascular consultation.      Latest Ref Rng & Units 10/19/2022    2:33 AM 10/18/2022   11:28 PM 10/18/2022   11:23 PM  CBC  WBC 4.0 - 10.5 K/uL 11.3   12.2   Hemoglobin 13.0 - 17.0 g/dL 15.8  16.0  15.5   Hematocrit 39.0 - 52.0 % 47.0  47.0  45.1   Platelets 150 - 400 K/uL 263   248        Latest Ref Rng & Units 10/19/2022    2:33 AM 10/18/2022   11:28 PM 10/18/2022   11:23 PM  BMP  Glucose 70 - 99 mg/dL 123  141  139   BUN 8 - 23 mg/dL 27  32  30   Creatinine 0.61 - 1.24 mg/dL 1.11  1.10  1.14   Sodium 135 - 145 mmol/L 137  142  138   Potassium 3.5 - 5.1 mmol/L 3.8  3.6  3.5   Chloride 98 - 111 mmol/L 106  105  106   CO2 22 - 32 mmol/L 21   24   Calcium 8.9 - 10.3 mg/dL  9.0   8.7     BNP No results found for: "BNP"  ProBNP No results found for: "PROBNP"  PFT    Component Value Date/Time   FEV1PRE 3.32 08/11/2020 1017   FEV1POST 3.47 08/11/2020 1017   FVCPRE 4.87 08/11/2020 1017   FVCPOST 4.91 08/11/2020 1017   TLC 7.94 08/11/2020 1017   DLCOUNC 26.12 08/11/2020 1017   PREFEV1FVCRT 68 08/11/2020 1017   PSTFEV1FVCRT 71 08/11/2020 1017    NM PET Image Initial (PI) Skull Base To Thigh  Result Date: 11/11/2022 CLINICAL DATA:  Initial treatment strategy for pulmonary nodule. EXAM: NUCLEAR MEDICINE PET SKULL BASE TO THIGH TECHNIQUE: 11.6 mCi F-18 FDG was injected intravenously. Full-ring PET imaging was performed from the skull base to thigh after the radiotracer. CT data was obtained and used for attenuation correction and anatomic localization. Fasting blood glucose: 94 mg/dl COMPARISON:  Multiple priors including chest CT October 27, 2022 FINDINGS: Mediastinal blood pool activity: SUV max 2.2 Liver activity: SUV max n/a NECK: No hypermetabolic cervical adenopathy. Mildly metabolic mucosal thickening of the left maxillary sinus with a max SUV of 3.7. Incidental CT findings:  None. CHEST: Low-level FDG avidity less than that of background blood pool, in the spiculated right upper lobe pulmonary nodule measuring 14 x 11 mm on image 29/7 with a max SUV of 1.8 Minimally metabolic prominent/mildly enlarged thoracic lymph nodes, which have been stable dating back to at least July 2021, for instance a low right paratracheal lymph node measuring 11 mm in short axis on image 88/3 with a max SUV of 2.1. No abnormal FDG avidity in the solid 7 mm pulmonary nodule in the paramedian right upper lobe on image 28/7, although below the resolution of PET-CT and is stable dating back to July 21 compatible with a benign finding. Incidental CT findings: Aortic atherosclerosis. Coronary artery calcifications/stents. ABDOMEN/PELVIS: No abnormal hypermetabolic activity within the liver,  pancreas, adrenal glands, or spleen. No hypermetabolic lymph nodes in the abdomen or pelvis. Incidental CT findings: Nonobstructive renal stones. Bilateral fluid density renal lesions are compatible with cysts and considered benign requiring no independent imaging follow-up. Aortic atherosclerosis. 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 SKELETON: No focal hypermetabolic activity to suggest skeletal metastasis. FDG avid right hip joint effusion with a max SUV of 7.3. Incidental CT findings: Posterior thoracolumbar fusion hardware. Multilevel degenerative changes spine. IMPRESSION: 1. Low-level FDG avidity less than that of background blood pool in the spiculated right upper lobe pulmonary nodule measuring 14 x 11 mm, which is nonspecific but in context with prior imaging this is favored to reflect a resolving infectious or inflammatory etiology. However, low-grade malignancy while not favored is not excluded. Suggest continued attention on follow-up imaging. 2. Minimally metabolic prominent/mildly enlarged thoracic lymph nodes, which have been stable dating back to at least July 2124 compatible with a benign finding. 3. No abnormal FDG avidity in the solid 7 mm pulmonary nodule in the paramedian right upper lobe, although below the resolution of PET-CT and is stable dating back to at least July 21, compatible with a benign finding. 4. 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 Recommend follow-up every 12 months and vascular consultation. This recommendation follows ACR consensus guidelines: White Paper of the ACR Incidental Findings Committee II on Vascular Findings. J Am Coll Radiol 2013; 99:371-696. 5.  Aortic Atherosclerosis (ICD10-I70.0). Electronically Signed   By: Dahlia Bailiff M.D.   On: 11/11/2022 09:30   CT Chest Wo Contrast  Result Date: 10/28/2022 CLINICAL DATA:  Lung nodule EXAM: CT CHEST WITHOUT CONTRAST TECHNIQUE: Multidetector CT  imaging of the chest was performed following the standard protocol without IV contrast. RADIATION DOSE REDUCTION: This exam was performed according to the departmental dose-optimization program which includes automated exposure control, adjustment of the mA and/or kV according to patient size and/or use of iterative reconstruction technique. COMPARISON:  09/21/2022 and older.  Numerous prior exams FINDINGS: Cardiovascular: Small pericardial effusion, increased from previous. The heart is nonenlarged. Prominent coronary artery calcifications are seen. The thoracic aorta has a normal course and caliber on this noncontrast examination with some scattered vascular calcifications. Mediastinum/Nodes: No specific abnormal lymph node enlargement seen in the axillary region on this noncontrast exam. No definite hilar nodes. There are several prominent mediastinal nodes. These are overall under a cm in short axis and not pathologic by size criteria but are simply more numerous than usually seen. One of the larger nodes is seen anterior to the right main bronchus on series 2, image 50 measuring 16 by 11 mm. On the most recent prior this measured 16 by 10 mm as well. Going back  to a study from July 2021 this node measured 16 by 10 mm. This has a fatty hilum. The other nodes in the mediastinum also have been present for at least 2 years. Normal caliber thoracic esophagus. Slightly small thyroid gland Lungs/Pleura: . left lung is grossly clear. No consolidation, pneumothorax or effusion. Stable small calcification along the interlobar fissure on image 68 going back to at least July 2021. The right lung is grossly clear as well without consolidation, pneumothorax or effusion. There is spiculated nodular area identified peripherally in the right upper lobe laterally. Once again there is a spiculated nodule in the lateral margin of the right upper lobe which on image 49 of series 4 measures 19 x 13 mm. Going back to the prior study  when measured in the same fashion as today's exam lesion would have measured 2.0 x 1.2 cm when measured in same fashion today. On the study of June 2023 this was a cavitary lesion that had measured 2.4 x 1.3 cm. Please correlate with prior workup. Elsewhere more medial in the right upper lobe is a noncalcified rounded nodule measuring 8 mm in maximal dimension which has been present and essentially stable going back to July 2021 demonstrating long-term stability. Calcified nodule left lower lobe on image 51 of series 4 consistent with old granulomatous disease. Upper Abdomen: In the upper abdomen there are multiple nonobstructing renal stones. The adrenal glands are preserved. There is a benign-appearing cyst along the medial aspect of the left renal upper pole measuring 2.4 cm in diameter with Hounsfield unit of less than 0. Musculoskeletal: Significant streak artifact related to the hardware along the spine thoracic region and upper lumbar spine. Scattered degenerative changes identified. Diffuse bridging and near bridging endplate osteophytes. IMPRESSION: Essentially stable spiculated nodule in the right upper lobe laterally which had previously been a cavitary lesion earlier in 2023 as previously described. Again based on the overall appearance this would have Lung-RADS category 4B. With the persistence could consider a PET-CT scan versus short interval follow up in 3 months. Separate noncalcified rounded right upper lobe nodule has been stable since at least 2021 demonstrating long-term stability. No additional follow-up. Kidney stones. Small pericardial effusion slightly larger than the study December 2023 Aortic Atherosclerosis (ICD10-I70.0). Electronically Signed   By: Jill Side M.D.   On: 10/28/2022 12:31   ECHOCARDIOGRAM COMPLETE  Result Date: 10/19/2022    ECHOCARDIOGRAM REPORT   Patient Name:   Bryton A Putt Date of Exam: 10/19/2022 Medical Rec #:  [id number]     Height:       71.0 in Accession #:     [id number]    Weight:       215.6 lb Date of Birth:  03-Apr-1956      BSA:          2.177 m Patient Age:    58 years      BP:           148/88 mmHg Patient Gender: M             HR:           60 bpm. Exam Location:  Inpatient Procedure: 2D Echo, Cardiac Doppler, Color Doppler and Intracardiac            Opacification Agent Indications:     CAD Native Vessel I25.10  History:         Patient has no prior history of Echocardiogram examinations.  STEMI and CAD, Carotid Disease; Risk Factors:Hypertension,                  Dyslipidemia and Current Smoker.  Sonographer:     Greer Pickerel Referring Phys:  [place] Diagnosing Phys: Adrian Prows MD  Sonographer Comments: Technically difficult study due to poor echo windows. Image acquisition challenging due to patient body habitus and Image acquisition challenging due to respiratory motion. IMPRESSIONS  1. Left ventricular ejection fraction, by estimation, is 60 to 65%. The left ventricle has normal function. The left ventricle has no regional wall motion abnormalities. Left ventricular diastolic parameters were normal.  2. Right ventricular systolic function is normal. The right ventricular size is normal.  3. The mitral valve is normal in structure. No evidence of mitral valve regurgitation. No evidence of mitral stenosis.  4. The aortic valve is normal in structure. Aortic valve regurgitation is mild. No aortic stenosis is present.  5. The inferior vena cava is normal in size with greater than 50% respiratory variability, suggesting right atrial pressure of 3 mmHg. FINDINGS  Left Ventricle: Left ventricular ejection fraction, by estimation, is 60 to 65%. The left ventricle has normal function. The left ventricle has no regional wall motion abnormalities. Definity contrast agent was given IV to delineate the left ventricular  endocardial borders. The left ventricular internal cavity size was normal in size. There is no left ventricular hypertrophy. Left  ventricular diastolic parameters were normal. Right Ventricle: The right ventricular size is normal. No increase in right ventricular wall thickness. Right ventricular systolic function is normal. Left Atrium: Left atrial size was normal in size. Right Atrium: Right atrial size was normal in size. Pericardium: There is no evidence of pericardial effusion. Mitral Valve: The mitral valve is normal in structure. No evidence of mitral valve regurgitation. No evidence of mitral valve stenosis. Tricuspid Valve: The tricuspid valve is normal in structure. Tricuspid valve regurgitation is not demonstrated. No evidence of tricuspid stenosis. Aortic Valve: The aortic valve is normal in structure. Aortic valve regurgitation is mild. Aortic regurgitation PHT measures 387 msec. No aortic stenosis is present. Pulmonic Valve: The pulmonic valve was normal in structure. Pulmonic valve regurgitation is not visualized. No evidence of pulmonic stenosis. Aorta: The aortic root is normal in size and structure. Venous: The inferior vena cava is normal in size with greater than 50% respiratory variability, suggesting right atrial pressure of 3 mmHg. IAS/Shunts: No atrial level shunt detected by color flow Doppler.  LEFT VENTRICLE PLAX 2D LVOT diam:     1.80 cm      Diastology LV SV:         61           LV e' medial:    7.18 cm/s LV SV Index:   28           LV E/e' medial:  12.1 LVOT Area:     2.54 cm     LV e' lateral:   7.29 cm/s                             LV E/e' lateral: 11.9  LV Volumes (MOD) LV vol d, MOD A2C: 71.6 ml LV vol d, MOD A4C: 117.0 ml LV vol s, MOD A2C: 30.0 ml LV vol s, MOD A4C: 35.5 ml LV SV MOD A2C:     41.6 ml LV SV MOD A4C:     117.0 ml LV SV MOD  BP:      64.8 ml RIGHT VENTRICLE RV S prime:     11.60 cm/s TAPSE (M-mode): 2.0 cm LEFT ATRIUM             Index        RIGHT ATRIUM           Index LA diam:        3.10 cm 1.42 cm/m   RA Area:     27.10 cm LA Vol (A2C):   56.6 ml 26.00 ml/m  RA Volume:   105.00 ml  48.23 ml/m LA Vol (A4C):   57.0 ml 26.18 ml/m LA Biplane Vol: 60.8 ml 27.93 ml/m  AORTIC VALVE LVOT Vmax:   104.00 cm/s LVOT Vmean:  66.600 cm/s LVOT VTI:    0.238 m AI PHT:      387 msec  AORTA Ao Root diam: 3.40 cm MITRAL VALVE MV Area (PHT): 2.87 cm     SHUNTS MV Decel Time: 264 msec     Systemic VTI:  0.24 m MR Peak grad: 19.7 mmHg     Systemic Diam: 1.80 cm MR Vmax:      222.00 cm/s MV E velocity: 87.00 cm/s MV A velocity: 110.00 cm/s MV E/A ratio:  0.79 Adrian Prows MD Electronically signed by Adrian Prows MD Signature Date/Time: 10/19/2022/10:10:15 AM    Final    CARDIAC CATHETERIZATION  Result Date: 10/19/2022 Images from the original result were not included.   A drug-eluting stent was successfully placed using a SYNERGY XD [id number]. Left Heart Catheterization 10/19/22: LV: LV normal, LVEF 45% with inferior akinesis.  No significant regurgitation. LM: Large-caliber vessel.  Smooth and normal. LAD: Gives origin to large D1 and D2.  Mild disease is noted in the midsegment of the LAD. Cx: Small, mid segment has a 30% mild disease, resulting to large OM1 and OM 2. RCA: Large-caliber vessel and a dominant vessel.  Has moderate tortuosity in the proximal segment at the crux.  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  There is distal embolization in the PL and PDA branches. Intervention data: Successful aspiration thrombectomy using priority 1 thrombectomy catheter followed by direct stenting with 4.0 x 16 mm Synergy XD at 20 atmospheric pressure at the proximal circumflex at the proximal, 2 to the proximal edge dissection, covered with a 4.5 by pulmonary meter Synergy XD deployed at 13 atm pressure for 30 seconds then postdilated with the same stent balloon at 16 pressure pressure between the stent starts. Recommendation: Patient will need dual antiplatelet therapy with aspirin and Brilinta for a period of 1 year.  I will start him on cangrelor infusion until infusion is done, patient did receive 180 mg of  Brilinta in the Cath Lab.  150 mcg utilized. Adrian Prows, MD, [facility] 10/19/2022, 12:36 AM Office: [phone] Fax: [phone] Pager: [phone]     Past medical hx Past Medical History:  Diagnosis Date   Bulging lumbar disc    GERD (gastroesophageal reflux disease)    History of kidney stones    Hypertension    Pneumonia    May 2023     Social History   Tobacco Use   Smoking status: Every Day    Packs/day: 0.50    Years: 40.00    Total pack years: 20.00    Types: Cigarettes    Passive exposure: Never   Smokeless tobacco: Never  Vaping Use   Vaping Use: Never used  Substance Use Topics   Alcohol use: Yes  Comment: very little; occasionally   Drug use: No    Mr.Petrella reports that he has been smoking cigarettes. He has a 20.00 pack-year smoking history. He has never been exposed to tobacco smoke. He has never used smokeless tobacco. He reports current alcohol use. He reports that he does not use drugs.  Tobacco Cessation: Ready to quit: Not Answered Counseling given: Not Answered   Past surgical hx, Family hx, Social hx all reviewed.  Current Outpatient Medications on File Prior to Visit  Medication Sig   aspirin EC 81 MG tablet Take 81 mg by mouth in the morning. Swallow whole.   atorvastatin (LIPITOR) 20 MG tablet Take 1 tablet (20 mg total) by mouth daily.   cyclobenzaprine (FLEXERIL) 10 MG tablet Take 1 tablet (10 mg total) by mouth 3 (three) times daily as needed for muscle spasms.   ezetimibe (ZETIA) 10 MG tablet Take 1 tablet (10 mg total) by mouth daily.   gabapentin (NEURONTIN) 300 MG capsule Take 600 mg by mouth 3 (three) times daily.   lisinopril (ZESTRIL) 20 MG tablet Take 1 tablet (20 mg total) by mouth daily.   metoprolol succinate (TOPROL-XL) 25 MG 24 hr tablet Take 1 tablet (25 mg total) by mouth daily.   mupirocin ointment (BACTROBAN) 2 % Place 1 Application into the nose 2 (two) times daily.   nicotine (NICODERM CQ - DOSED IN MG/24 HOURS) 21  mg/24hr patch Place 1 patch (21 mg total) onto the skin daily.   prasugrel (EFFIENT) 10 MG TABS tablet Take 1 tablet (10 mg total) by mouth daily.   tamsulosin (FLOMAX) 0.4 MG CAPS capsule Take 0.4 mg by mouth daily.   traMADol (ULTRAM) 50 MG tablet Take 1 tablet (50 mg total) by mouth 2 (two) times daily as needed.   Vitamin D, Ergocalciferol, (DRISDOL) 1.25 MG (50000 UNIT) CAPS capsule Take 50,000 Units by mouth every Sunday.   nitroGLYCERIN (NITROSTAT) 0.4 MG SL tablet Place 1 tablet (0.4 mg total) under the tongue every 5 (five) minutes as needed for up to 25 days for chest pain.   tadalafil (CIALIS) 10 MG tablet Take 10 mg by mouth daily as needed. (Patient not taking: Reported on 10/27/2022)   No current facility-administered medications on file prior to visit.     No Known Allergies  Review Of Systems:  Constitutional:   No  weight loss, night sweats,  Fevers, chills, fatigue, or  lassitude.  HEENT:   No headaches,  Difficulty swallowing,  Tooth/dental problems, or  Sore throat,                No sneezing, itching, ear ache, nasal congestion, post nasal drip,   CV:  No chest pain,  Orthopnea, PND, swelling in lower extremities, anasarca, dizziness, palpitations, syncope.   GI  No heartburn, indigestion, abdominal pain, nausea, vomiting, diarrhea, change in bowel habits, loss of appetite, bloody stools.   Resp: No shortness of breath with exertion or at rest.  No excess mucus, no productive cough,  No non-productive cough,  No coughing up of blood.  No change in color of mucus.  No wheezing.  No chest wall deformity  Skin: no rash or lesions.  GU: no dysuria, change in color of urine, no urgency or frequency.  No flank pain, no hematuria   MS:  No joint pain or swelling.  No decreased range of motion.  No back pain.  Psych:  No change in mood or affect. No depression or anxiety.  No memory  loss.   Vital Signs BP 112/68   Pulse (!) 50   Temp 97.8 F (36.6 C) (Oral)   Ht 6'  (1.829 m)   Wt 218 lb 6.4 oz (99.1 kg)   SpO2 99%   BMI 29.62 kg/m    Physical Exam:  General- No distress,  A&Ox3 ENT: No sinus tenderness, TM clear, pale nasal mucosa, no oral exudate,no post nasal drip, no LAN Cardiac: S1, S2, regular rate and rhythm, no murmur Chest: No wheeze/ rales/ dullness; no accessory muscle use, no nasal flaring, no sternal retractions Abd.: Soft Non-tender Ext: No clubbing cyanosis, edema Neuro:  normal strength Skin: No rashes, warm and dry Psych: normal mood and behavior   Assessment/Plan  No problem-specific Assessment & Plan notes found for this encounter.    Magdalen Spatz, NP 11/15/2022  11:37 AM

## 2022-11-15 NOTE — Patient Instructions (Addendum)
It is good to see you today. The PET scan shows a low uptake in the lesion of concern, but radiology feels this is likely infectious or inflammatory. We will do a 3 month follow up Low Dose Ct chest  to re-evaluate the area of concern. Please work on quitting smoking.  You can call [phone]. This will help your heart and lungs.  Continue Flonase ( or generic) daily for sinus issues.  Add non-sedating antihistamine  daily .( Get generic)  Please contact office for sooner follow up if symptoms do not improve or worsen or seek emergency care

## 2022-11-15 NOTE — Progress Notes (Signed)
Cardiac Individual Treatment Plan  Patient Details  Name: Leon Mitchell MRN: [id number] Date of Birth: 1956/03/15 Referring Provider:   Flowsheet Row CARDIAC REHAB PHASE II ORIENTATION from 11/15/2022 in [place]  Referring Provider Dr. Einar Gip       Initial Encounter Date:  Flowsheet Row CARDIAC REHAB PHASE II ORIENTATION from 11/15/2022 in [place]  Date 11/15/22       Visit Diagnosis: Acute ST elevation myocardial infarction (STEMI) of inferolateral wall [facility])  Status post coronary artery stent placement  Patient's Home Medications on Admission:  Current Outpatient Medications:    aspirin EC 81 MG tablet, Take 81 mg by mouth in the morning. Swallow whole., Disp: , Rfl:    atorvastatin (LIPITOR) 20 MG tablet, Take 1 tablet (20 mg total) by mouth daily., Disp: 90 tablet, Rfl: 1   cyclobenzaprine (FLEXERIL) 10 MG tablet, Take 1 tablet (10 mg total) by mouth 3 (three) times daily as needed for muscle spasms., Disp: 30 tablet, Rfl: 0   ezetimibe (ZETIA) 10 MG tablet, Take 1 tablet (10 mg total) by mouth daily., Disp: 90 tablet, Rfl: 1   gabapentin (NEURONTIN) 300 MG capsule, Take 600 mg by mouth 3 (three) times daily., Disp: , Rfl:    HYDROcodone-acetaminophen (NORCO/VICODIN) 5-325 MG tablet, Take 1 tablet by mouth 3 (three) times daily as needed for moderate pain., Disp: , Rfl:    lisinopril (ZESTRIL) 20 MG tablet, Take 1 tablet (20 mg total) by mouth daily., Disp: 90 tablet, Rfl: 3   metoprolol succinate (TOPROL-XL) 25 MG 24 hr tablet, Take 1 tablet (25 mg total) by mouth daily., Disp: 90 tablet, Rfl: 1   nicotine (NICODERM CQ - DOSED IN MG/24 HOURS) 21 mg/24hr patch, Place 1 patch (21 mg total) onto the skin daily., Disp: 30 patch, Rfl: 0   prasugrel (EFFIENT) 10 MG TABS tablet, Take 1 tablet (10 mg total) by mouth daily., Disp: 30 tablet, Rfl: 0   tamsulosin (FLOMAX) 0.4 MG CAPS capsule, Take 0.4 mg by mouth daily., Disp: , Rfl:     traMADol (ULTRAM) 50 MG tablet, Take 1 tablet (50 mg total) by mouth 2 (two) times daily as needed. (Patient taking differently: Take 50 mg by mouth 2 (two) times daily as needed for moderate pain.), Disp: 60 tablet, Rfl: 1   Vitamin D, Ergocalciferol, (DRISDOL) 1.25 MG (50000 UNIT) CAPS capsule, Take 50,000 Units by mouth every Sunday., Disp: , Rfl:    nitroGLYCERIN (NITROSTAT) 0.4 MG SL tablet, Place 1 tablet (0.4 mg total) under the tongue every 5 (five) minutes as needed for up to 25 days for chest pain., Disp: 25 tablet, Rfl: 3  Past Medical History: Past Medical History:  Diagnosis Date   Bulging lumbar disc    GERD (gastroesophageal reflux disease)    History of kidney stones    Hypertension    Pneumonia    May 2023    Tobacco Use: Social History   Tobacco Use  Smoking Status Every Day   Packs/day: 0.50   Years: 40.00   Total pack years: 20.00   Types: Cigarettes   Passive exposure: Never  Smokeless Tobacco Never    Labs: Review Flowsheet       Latest Ref Rng & Units 04/20/2022 10/18/2022 10/19/2022  Labs for ITP Cardiac and Pulmonary Rehab  Cholestrol 0 - 200 mg/dL 192  211  203   LDL (calc) 0 - 99 mg/dL 137  159  147   HDL-C >40 mg/dL 34  35  41   Trlycerides <150 mg/dL 104  84  75   Hemoglobin A1c 4.8 - 5.6 % - 5.1  -  TCO2 22 - 32 mmol/L - 24  -    Capillary Blood Glucose: No results found for: "GLUCAP"   Exercise Target Goals: Exercise Program Goal: Individual exercise prescription set using results from initial 6 min walk test and THRR while considering  patient's activity barriers and safety.   Exercise Prescription Goal: Starting with aerobic activity 30 plus minutes a day, 3 days per week for initial exercise prescription. Provide home exercise prescription and guidelines that participant acknowledges understanding prior to discharge.  Activity Barriers & Risk Stratification:  Activity Barriers & Cardiac Risk Stratification - 11/15/22 1327        Activity Barriers & Cardiac Risk Stratification   Activity Barriers Arthritis;Back Problems;Neck/Spine Problems;Joint Problems;Deconditioning;Muscular Weakness;Balance Concerns;History of Falls;Assistive Device    Cardiac Risk Stratification High             6 Minute Walk:  6 Minute Walk     Row Name 11/15/22 1445         6 Minute Walk   Phase Initial     Distance 650 feet     Walk Time 6 minutes     # of Rest Breaks 0     MPH 1.23     METS 1.35     RPE 12     VO2 Peak 4.74     Symptoms Yes (comment)     Comments Arms fatigued due to holding himself up     Resting HR 50 bpm     Resting BP 124/60     Resting Oxygen Saturation  97 %     Exercise Oxygen Saturation  during 6 min walk 99 %     Max Ex. HR 60 bpm     Max Ex. BP 128/64     2 Minute Post BP 118/60              Oxygen Initial Assessment:   Oxygen Re-Evaluation:   Oxygen Discharge (Final Oxygen Re-Evaluation):   Initial Exercise Prescription:  Initial Exercise Prescription - 11/15/22 1400       Date of Initial Exercise RX and Referring Provider   Date 11/15/22    Referring Provider Dr. Einar Gip    Expected Discharge Date 02/10/23      NuStep   Level 1    SPM 60    Minutes 22      Arm Ergometer   Level 1    RPM 45    Minutes 17      Prescription Details   Frequency (times per week) 3    Duration Progress to 30 minutes of continuous aerobic without signs/symptoms of physical distress      Intensity   THRR 40-80% of Max Heartrate 61-122    Ratings of Perceived Exertion 11-13      Resistance Training   Training Prescription Yes    Weight 4    Reps 10-15             Perform Capillary Blood Glucose checks as needed.  Exercise Prescription Changes:   Exercise Comments:   Exercise Goals and Review:   Exercise Goals     Row Name 11/15/22 1447             Exercise Goals   Increase Physical Activity Yes       Intervention Provide advice, education, support and  counseling  about physical activity/exercise needs.;Develop an individualized exercise prescription for aerobic and resistive training based on initial evaluation findings, risk stratification, comorbidities and participant's personal goals.       Expected Outcomes Short Term: Attend rehab on a regular basis to increase amount of physical activity.;Long Term: Add in home exercise to make exercise part of routine and to increase amount of physical activity.;Long Term: Exercising regularly at least 3-5 days a week.       Increase Strength and Stamina Yes       Intervention Provide advice, education, support and counseling about physical activity/exercise needs.;Develop an individualized exercise prescription for aerobic and resistive training based on initial evaluation findings, risk stratification, comorbidities and participant's personal goals.       Expected Outcomes Short Term: Increase workloads from initial exercise prescription for resistance, speed, and METs.;Short Term: Perform resistance training exercises routinely during rehab and add in resistance training at home;Long Term: Improve cardiorespiratory fitness, muscular endurance and strength as measured by increased METs and functional capacity (6MWT)       Able to understand and use rate of perceived exertion (RPE) scale Yes       Intervention Provide education and explanation on how to use RPE scale       Expected Outcomes Short Term: Able to use RPE daily in rehab to express subjective intensity level;Long Term:  Able to use RPE to guide intensity level when exercising independently       Knowledge and understanding of Target Heart Rate Range (THRR) Yes       Intervention Provide education and explanation of THRR including how the numbers were predicted and where they are located for reference       Expected Outcomes Short Term: Able to state/look up THRR;Long Term: Able to use THRR to govern intensity when exercising independently;Short Term:  Able to use daily as guideline for intensity in rehab       Able to check pulse independently Yes       Intervention Provide education and demonstration on how to check pulse in carotid and radial arteries.;Review the importance of being able to check your own pulse for safety during independent exercise       Expected Outcomes Short Term: Able to explain why pulse checking is important during independent exercise;Long Term: Able to check pulse independently and accurately       Understanding of Exercise Prescription Yes       Intervention Provide education, explanation, and written materials on patient's individual exercise prescription       Expected Outcomes Short Term: Able to explain program exercise prescription;Long Term: Able to explain home exercise prescription to exercise independently                Exercise Goals Re-Evaluation :    Discharge Exercise Prescription (Final Exercise Prescription Changes):   Nutrition:  Target Goals: Understanding of nutrition guidelines, daily intake of sodium '1500mg'$ , cholesterol '200mg'$ , calories 30% from fat and 7% or less from saturated fats, daily to have 5 or more servings of fruits and vegetables.  Biometrics:  Pre Biometrics - 11/15/22 1448       Pre Biometrics   Height '5\' 11"'$  (1.803 m)    Weight 217 lb 6 oz (98.6 kg)    Waist Circumference 41 inches    Hip Circumference 39 inches    Waist to Hip Ratio 1.05 %    BMI (Calculated) 30.33    Triceps Skinfold 8 mm    % Body  Fat 26.1 %    Grip Strength 29.4 kg    Flexibility 0 in    Single Leg Stand 0 seconds              Nutrition Therapy Plan and Nutrition Goals:  Nutrition Therapy & Goals - 11/15/22 1329       Personal Nutrition Goals   Comments Pt admits that he knows he needs to make dietary changes. He knows what he should and should not eat, but he has not implemented it into his lifestyle yet.      Intervention Plan   Intervention Nutrition handout(s) given to  patient.    Expected Outcomes Short Term Goal: Understand basic principles of dietary content, such as calories, fat, sodium, cholesterol and nutrients.             Nutrition Assessments:  Nutrition Assessments - 11/15/22 1337       MEDFICTS Scores   Pre Score 50            MEDIFICTS Score Key: ?70 Need to make dietary changes  40-70 Heart Healthy Diet ? 40 Therapeutic Level Cholesterol Diet   Picture Your Plate Scores: <15 Unhealthy dietary pattern with much room for improvement. 41-50 Dietary pattern unlikely to meet recommendations for good health and room for improvement. 51-60 More healthful dietary pattern, with some room for improvement.  >60 Healthy dietary pattern, although there may be some specific behaviors that could be improved.    Nutrition Goals Re-Evaluation:   Nutrition Goals Discharge (Final Nutrition Goals Re-Evaluation):   Psychosocial: Target Goals: Acknowledge presence or absence of significant depression and/or stress, maximize coping skills, provide positive support system. Participant is able to verbalize types and ability to use techniques and skills needed for reducing stress and depression.  Initial Review & Psychosocial Screening:  Initial Psych Review & Screening - 11/15/22 1328       Initial Review   Current issues with Current Sleep Concerns      Family Dynamics   Good Support System? Yes    Comments His girlfriend supports him.      Barriers   Psychosocial barriers to participate in program There are no identifiable barriers or psychosocial needs.      Screening Interventions   Interventions Encouraged to exercise    Expected Outcomes Long Term goal: The participant improves quality of Life and PHQ9 Scores as seen by post scores and/or verbalization of changes;Short Term goal: Identification and review with participant of any Quality of Life or Depression concerns found by scoring the questionnaire.              Quality of Life Scores:  Quality of Life - 11/15/22 1448       Quality of Life   Select Quality of Life      Quality of Life Scores   Health/Function Pre 15.97 %    Socioeconomic Pre 22.08 %    Psych/Spiritual Pre 19.21 %    Family Pre 28.3 %    GLOBAL Pre 19.64 %            Scores of 19 and below usually indicate a poorer quality of life in these areas.  A difference of  2-3 points is a clinically meaningful difference.  A difference of 2-3 points in the total score of the Quality of Life Index has been associated with significant improvement in overall quality of life, self-image, physical symptoms, and general health in studies assessing change in quality of life.  PHQ-9: Review Flowsheet       11/15/2022  Depression screen PHQ 2/9  Decreased Interest 3  Down, Depressed, Hopeless 1  PHQ - 2 Score 4  Altered sleeping 1  Tired, decreased energy 3  Change in appetite 1  Feeling bad or failure about yourself  0  Trouble concentrating 1  Moving slowly or fidgety/restless 0  Suicidal thoughts 0  PHQ-9 Score 10  Difficult doing work/chores Somewhat difficult   Interpretation of Total Score  Total Score Depression Severity:  1-4 = Minimal depression, 5-9 = Mild depression, 10-14 = Moderate depression, 15-19 = Moderately severe depression, 20-27 = Severe depression   Psychosocial Evaluation and Intervention:  Psychosocial Evaluation - 11/15/22 1423       Psychosocial Evaluation & Interventions   Interventions Relaxation education;Stress management education;Encouraged to exercise with the program and follow exercise prescription    Comments Pt has no barriers to participate in CR. He reports altered sleep, and he has just started to take melatonin for this. He has no other identifiable psychosocial issues. He did score a 10 on his PHQ-9, and he relates this to his health and denies depression. He states he has little interest in doing things and that he feels tired  almost every day. This is due to his severe orthopedic issues. 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 All of this has been delayed until at least late summer due to his recent STEMI and stent. The pain that this causes has made him sedentary which has further worsened his energy levels. He continues to smoke 5-10 cigarettes per day, which is down from the 1 pack per day he was smoking before his STEMI. He is wearing a nicotine patch and reports some benefit. He admits that the reason he cannot quit is due to lack of willpower. He knows that he must get his mind ready to quit before he can do so. He did quit some years ago while taking Chantix, but he does not want to take Chantix again. He states that he is already taking enough pills as it is. He also admits that his lack of willpower is why he is still eating an unhealthy diet. He reports making no dietary changes since his STEMI, even though he knows he should. He hopes  that this program will help give him a push in the right direction. His goals while in the program are to improve his walking abillities and to be able to do more things that he enjoys. He was unsure if he would be able to do the program due to his orthopedic limitations, but he was able to do the arm ergometer and stepper at orientation. He is eager to start the program.    Expected Outcomes Pt will continue to have no identifiable psychosocial issues.    Continue Psychosocial Services  No Follow up required             Psychosocial Re-Evaluation:   Psychosocial Discharge (Final Psychosocial Re-Evaluation):   Vocational Rehabilitation: Provide vocational rehab assistance to qualifying candidates.   Vocational Rehab Evaluation & Intervention:  Vocational Rehab - 11/15/22 1353       Initial Vocational Rehab Evaluation & Intervention   Assessment shows need for Vocational Rehabilitation No      Vocational Rehab Re-Evaulation   Comments retired              Education: Education Goals: Education classes will be provided on a weekly basis, covering  required topics. Participant will state understanding/return demonstration of topics presented.  Learning Barriers/Preferences:  Learning Barriers/Preferences - 11/15/22 1341       Learning Barriers/Preferences   Learning Barriers None    Learning Preferences Skilled Demonstration;Written Material             Education Topics: Hypertension, Hypertension Reduction -Define heart disease and high blood pressure. Discus how high blood pressure affects the body and ways to reduce high blood pressure.   Exercise and Your Heart -Discuss why it is important to exercise, the FITT principles of exercise, normal and abnormal responses to exercise, and how to exercise safely.   Angina -Discuss definition of angina, causes of angina, treatment of angina, and how to decrease risk of having angina.   Cardiac Medications -Review what the following cardiac medications are used for, how they affect the body, and side effects that may occur when taking the medications.  Medications include Aspirin, Beta blockers, calcium channel blockers, ACE Inhibitors, angiotensin receptor blockers, diuretics, digoxin, and antihyperlipidemics.   Congestive Heart Failure -Discuss the definition of CHF, how to live with CHF, the signs and symptoms of CHF, and how keep track of weight and sodium intake.   Heart Disease and Intimacy -Discus the effect sexual activity has on the heart, how changes occur during intimacy as we age, and safety during sexual activity.   Smoking Cessation / COPD -Discuss different methods to quit smoking, the health benefits of quitting smoking, and the definition of COPD.   Nutrition I: Fats -Discuss the types of cholesterol, what cholesterol does to the heart, and how cholesterol levels can be controlled.   Nutrition II: Labels -Discuss the different components of food  labels and how to read food label   Heart Parts/Heart Disease and PAD -Discuss the anatomy of the heart, the pathway of blood circulation through the heart, and these are affected by heart disease.   Stress I: Signs and Symptoms -Discuss the causes of stress, how stress may lead to anxiety and depression, and ways to limit stress.   Stress II: Relaxation -Discuss different types of relaxation techniques to limit stress.   Warning Signs of Stroke / TIA -Discuss definition of a stroke, what the signs and symptoms are of a stroke, and how to identify when someone is having stroke.   Knowledge Questionnaire Score:  Knowledge Questionnaire Score - 11/15/22 1339       Knowledge Questionnaire Score   Pre Score 23/28             Core Components/Risk Factors/Patient Goals at Admission:  Personal Goals and Risk Factors at Admission - 11/15/22 1353       Core Components/Risk Factors/Patient Goals on Admission   Tobacco Cessation Yes    Number of packs per day 1/4-1/2    Intervention Assist the participant in steps to quit. Provide individualized education and counseling about committing to Tobacco Cessation, relapse prevention, and pharmacological support that can be provided by physician.;Advice worker, assist with locating and accessing local/national Quit Smoking programs, and support quit date choice.    Expected Outcomes Short Term: Will demonstrate readiness to quit, by selecting a quit date.;Short Term: Will quit all tobacco product use, adhering to prevention of relapse plan.;Long Term: Complete abstinence from all tobacco products for at least 12 months from quit date.    Improve shortness of breath with ADL's Yes    Intervention Provide education, individualized exercise plan and daily activity instruction to help decrease symptoms of SOB with  activities of daily living.    Expected Outcomes Short Term: Improve cardiorespiratory fitness to achieve a reduction  of symptoms when performing ADLs;Long Term: Be able to perform more ADLs without symptoms or delay the onset of symptoms    Hypertension Yes    Intervention Provide education on lifestyle modifcations including regular physical activity/exercise, weight management, moderate sodium restriction and increased consumption of fresh fruit, vegetables, and low fat dairy, alcohol moderation, and smoking cessation.;Monitor prescription use compliance.    Expected Outcomes Short Term: Continued assessment and intervention until BP is < 140/33m HG in hypertensive participants. < 130/836mHG in hypertensive participants with diabetes, heart failure or chronic kidney disease.;Long Term: Maintenance of blood pressure at goal levels.    Lipids Yes    Intervention Provide education and support for participant on nutrition & aerobic/resistive exercise along with prescribed medications to achieve LDL '70mg'$ , HDL >'40mg'$ .    Expected Outcomes Short Term: Participant states understanding of desired cholesterol values and is compliant with medications prescribed. Participant is following exercise prescription and nutrition guidelines.;Long Term: Cholesterol controlled with medications as prescribed, with individualized exercise RX and with personalized nutrition plan. Value goals: LDL < '70mg'$ , HDL > 40 mg.    Personal Goal Other Yes    Personal Goal Improve walking endurance and be able to do more things that he enjoys.    Intervention Attend CR three days per week and begin a home exercise program.    Expected Outcomes Pt will meet stated goal.             Core Components/Risk Factors/Patient Goals Review:    Core Components/Risk Factors/Patient Goals at Discharge (Final Review):    ITP Comments:   Comments: Patient arrived for 1st visit/orientation/education at 1300. Patient was referred to CR by Dr. GaEinar Gipue to Acute STEMI of inferolateral wall (I21.19) and status post coronary artery stent placement (Z95.5).  During orientation advised patient on arrival and appointment times what to wear, what to do before, during and after exercise. Reviewed attendance and class policy.  Pt is scheduled to return Cardiac Rehab on 11/21/2022 at 1100. Pt was advised to come to class 15 minutes before class starts.  Discussed RPE/Dpysnea scales. Patient participated in warm up stretches. Patient was able to complete 6 minute walk test.  Telemetry: Sinus bradycardia and NSR. Patient was measured for the equipment. Discussed equipment safety with patient. Took patient pre-anthropometric measurements. Patient finished visit at 1415.

## 2022-11-16 ENCOUNTER — Encounter: Payer: Self-pay | Admitting: Acute Care

## 2022-11-21 ENCOUNTER — Encounter (HOSPITAL_COMMUNITY)
Admission: RE | Admit: 2022-11-21 | Discharge: 2022-11-21 | Disposition: A | Discharge status: Home / Self Care | Payer: Medicare Other | Source: Ambulatory Visit | Attending: Cardiology | Admitting: Cardiology

## 2022-11-21 DIAGNOSIS — I2119 ST elevation (STEMI) myocardial infarction involving other coronary artery of inferior wall: Secondary | ICD-10-CM

## 2022-11-21 DIAGNOSIS — Z955 Presence of coronary angioplasty implant and graft: Secondary | ICD-10-CM

## 2022-11-21 NOTE — Progress Notes (Signed)
Daily Session Note  Patient Details  Name: Leon Mitchell MRN: [id number] Date of Birth: [birth year] Referring Provider:   Flowsheet Row CARDIAC REHAB PHASE II ORIENTATION from 11/15/2022 in [place]  Referring Provider Dr. Einar Gip       Encounter Date: 11/21/2022  Check In:  Session Check In - 11/21/22 1108       Check-In   Supervising physician immediately available to respond to emergencies [facility] MD immediately available    Physician(s) Dr. Domenic Polite    Location [facility]    Staff Present Leana Roe, BS, Exercise Physiologist;Dalton Sherrie George, MS, ACSM-CEP;Debra Wynetta Emery, RN, BSN    Virtual Visit No    Medication changes reported     No    Fall or balance concerns reported    Yes    Comments He has had several falls this year. 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 He walks with a rollator.    Tobacco Cessation No Change    Warm-up and Cool-down Not performed (comment)   pt was late to class for warm up   Resistance Training Performed No   pt was late   VAD Patient? No    PAD/SET Patient? No      Pain Assessment   Currently in Pain? No/denies    Pain Score 0-No pain    Multiple Pain Sites No             Capillary Blood Glucose: No results found for this or any previous visit (from the past 24 hour(s)).    Social History   Tobacco Use  Smoking Status Every Day   Packs/day: 0.50   Years: 40.00   Total pack years: 20.00   Types: Cigarettes   Passive exposure: Never  Smokeless Tobacco Never    Goals Met:  Independence with exercise equipment Exercise tolerated well No report of concerns or symptoms today  Goals Unmet:  Not Applicable  Comments: check out 1200   Dr. Carlyle Dolly is Medical Director for [place]

## 2022-11-23 ENCOUNTER — Encounter (HOSPITAL_COMMUNITY)
Admission: RE | Admit: 2022-11-23 | Discharge: 2022-11-23 | Disposition: A | Discharge status: Home / Self Care | Payer: Medicare Other | Source: Ambulatory Visit | Attending: Cardiology | Admitting: Cardiology

## 2022-11-23 DIAGNOSIS — I2119 ST elevation (STEMI) myocardial infarction involving other coronary artery of inferior wall: Secondary | ICD-10-CM | POA: Diagnosis not present

## 2022-11-23 DIAGNOSIS — Z955 Presence of coronary angioplasty implant and graft: Secondary | ICD-10-CM | POA: Diagnosis not present

## 2022-11-23 NOTE — Progress Notes (Signed)
Daily Session Note  Patient Details  Name: Leon Mitchell MRN: [id number] Date of Birth: 1956/06/23 Referring Provider:   Flowsheet Row CARDIAC REHAB PHASE II ORIENTATION from 11/15/2022 in [place]  Referring Provider Dr. Einar Gip       Encounter Date: 11/23/2022  Check In:  Session Check In - 11/23/22 1100       Check-In   Supervising physician immediately available to respond to emergencies [facility] MD immediately available    Physician(s) Dr. Domenic Polite    Location [facility]    Staff Present Hoy Register MHA, MS, ACSM-CEP;[employer] BSN, RN;[place] Wherry Wynetta Emery, RN, BSN    Virtual Visit No    Medication changes reported     No    Fall or balance concerns reported    Yes    Comments He has had several falls this year. 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 He walks with a rollator.    Tobacco Cessation No Change    Warm-up and Cool-down Performed as group-led instruction    Resistance Training Performed Yes    VAD Patient? No    PAD/SET Patient? No      Pain Assessment   Currently in Pain? Yes    Pain Score 5     Pain Location Hip    Pain Orientation Right    Pain Descriptors / Indicators Sore;Constant    Pain Type Chronic pain    Pain Onset More than a month ago    Pain Frequency Constant    Pain Relieving Factors Tramadol, Tylenol    Multiple Pain Sites Yes      2nd Pain Site   Pain Score 5    Pain Location Knee    Pain Orientation Right    Pain Descriptors / Indicators Sore;Constant    Pain Onset More than a month ago    Pain Frequency Constant    Pain Relieving Factors Tramadol; Tylenol      3rd Pain Site   Pain Score 5    Pain Location Knee    Pain Orientation Left    Pain Descriptors / Indicators Sore;Constant    Pain Onset More than a month ago    Pain Frequency Constant    Pain Relieving Factors Tramadol; Tylenol             Capillary  Blood Glucose: No results found for this or any previous visit (from the past 24 hour(s)).    Social History   Tobacco Use  Smoking Status Every Day   Packs/day: 0.50   Years: 40.00   Total pack years: 20.00   Types: Cigarettes   Passive exposure: Never  Smokeless Tobacco Never    Goals Met:  Independence with exercise equipment Exercise tolerated well No report of concerns or symptoms today Strength training completed today  Goals Unmet:  Not Applicable  Comments: Check out 1200.   Dr. Carlyle Dolly is Medical Director for [facility]

## 2022-11-24 ENCOUNTER — Telehealth: Payer: Self-pay

## 2022-11-24 DIAGNOSIS — I2510 Atherosclerotic heart disease of native coronary artery without angina pectoris: Secondary | ICD-10-CM

## 2022-11-24 MED ORDER — PRASUGREL HCL 10 MG PO TABS: 10.0000 mg | ORAL_TABLET | Freq: Every day | ORAL | 3 refills | Status: DC

## 2022-11-24 NOTE — Telephone Encounter (Signed)
Can someone call him and advise him of above

## 2022-11-24 NOTE — Telephone Encounter (Signed)
He should not stop Effient. I have sent for 90 days with 3 refills

## 2022-11-24 NOTE — Telephone Encounter (Signed)
He will call his PCP about pain management. He did want to know if he should still be taking the Prasugrel (effient)? It says the Rx is expired, is this something you want me to send in for him?

## 2022-11-24 NOTE — Telephone Encounter (Signed)
Can he contact his PCP for this. They will be better in this regard

## 2022-11-24 NOTE — Telephone Encounter (Signed)
ICD-10-CM   1. Coronary artery disease involving native coronary artery of native heart without angina pectoris  I25.10 prasugrel (EFFIENT) 10 MG TABS tablet     Meds ordered this encounter  Medications   prasugrel (EFFIENT) 10 MG TABS tablet    Sig: Take 1 tablet (10 mg total) by mouth daily.    Dispense:  90 tablet    Refill:  3

## 2022-11-24 NOTE — Telephone Encounter (Signed)
Patient calling for something stronger than Tramadol he says its not helping his pain.

## 2022-11-25 ENCOUNTER — Encounter (HOSPITAL_COMMUNITY)
Admission: RE | Admit: 2022-11-25 | Discharge: 2022-11-25 | Disposition: A | Discharge status: Home / Self Care | Payer: Medicare Other | Source: Ambulatory Visit | Attending: Cardiology | Admitting: Cardiology

## 2022-11-25 DIAGNOSIS — Z955 Presence of coronary angioplasty implant and graft: Secondary | ICD-10-CM

## 2022-11-25 DIAGNOSIS — I2119 ST elevation (STEMI) myocardial infarction involving other coronary artery of inferior wall: Secondary | ICD-10-CM | POA: Diagnosis not present

## 2022-11-25 NOTE — Telephone Encounter (Signed)
Spoke to the patient and let him know

## 2022-11-25 NOTE — Progress Notes (Signed)
Daily Session Note  Patient Details  Name: Leon Mitchell MRN: [id number] Date of Birth: October 23, 1955 Referring Provider:   Flowsheet Row CARDIAC REHAB PHASE II ORIENTATION from 11/15/2022 in [place]  Referring Provider Dr. Einar Gip       Encounter Date: 11/25/2022  Check In:  Session Check In - 11/25/22 1056       Check-In   Supervising physician immediately available to respond to emergencies [facility] immediately available    Physician(s) Dr. Johnsie Cancel    Location [facility]    Staff Present Leana Roe, BS, Exercise Physiologist;Joziyah Roblero Wynetta Emery, RN, Joanette Gula, RN, BSN    Virtual Visit No    Medication changes reported     No    Fall or balance concerns reported    Yes    Comments He has had several falls this year. 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 He walks with a rollator.    Tobacco Cessation No Change    Warm-up and Cool-down Performed as group-led instruction    Resistance Training Performed Yes    VAD Patient? No    PAD/SET Patient? No      Pain Assessment   Currently in Pain? No/denies    Pain Score 5     Pain Location Hip    Pain Orientation Right    Pain Descriptors / Indicators Constant    Pain Type Chronic pain    Pain Onset More than a month ago    Pain Frequency Constant    Pain Relieving Factors Tramadol, Tylenol    Multiple Pain Sites Yes      2nd Pain Site   Pain Score 5    Pain Location Knee    Pain Orientation Right    Pain Descriptors / Indicators Constant;Sore    Pain Frequency Constant      Pain   Pain Onset More than a month ago    Pain Onset More than a month ago      Pain Screening   Pain Relieving Factors Tramadol, Tylenol    Pain Relieving Factors Tramadol; Tylenol      3rd Pain Site   Pain Score 5    Pain Location Knee    Pain Orientation Left    Pain Descriptors / Indicators Constant;Sore    Pain Frequency  Constant             Capillary Blood Glucose: No results found for this or any previous visit (from the past 24 hour(s)).    Social History   Tobacco Use  Smoking Status Every Day   Packs/day: 0.50   Years: 40.00   Total pack years: 20.00   Types: Cigarettes   Passive exposure: Never  Smokeless Tobacco Never    Goals Met:  Independence with exercise equipment Exercise tolerated well No report of concerns or symptoms today Strength training completed today  Goals Unmet:  Not Applicable  Comments: Check out 1200.   Dr. Carlyle Dolly is Medical Director for [facility]

## 2022-11-28 ENCOUNTER — Encounter (HOSPITAL_COMMUNITY)
Admission: RE | Admit: 2022-11-28 | Discharge: 2022-11-28 | Disposition: A | Discharge status: Home / Self Care | Payer: Medicare Other | Source: Ambulatory Visit | Attending: Cardiology | Admitting: Cardiology

## 2022-11-28 DIAGNOSIS — Z955 Presence of coronary angioplasty implant and graft: Secondary | ICD-10-CM | POA: Diagnosis not present

## 2022-11-28 DIAGNOSIS — I2119 ST elevation (STEMI) myocardial infarction involving other coronary artery of inferior wall: Secondary | ICD-10-CM

## 2022-11-28 NOTE — Progress Notes (Signed)
Daily Session Note  Patient Details  Name: SABIEN MAKAREWICZ MRN: [id number] Date of Birth: 30-Jun-1956 Referring Provider:   Flowsheet Row CARDIAC REHAB PHASE II ORIENTATION from 11/15/2022 in [place]  Referring Provider Dr. Einar Gip       Encounter Date: 11/28/2022  Check In:  Session Check In - 11/28/22 1100       Check-In   Supervising physician immediately available to respond to emergencies [facility] MD immediately available    Physician(s) Dr. Dellia Cloud    Location [facility]    Staff Present Leana Roe, BS, Exercise Physiologist;Daphyne Hassell Done, RN, BSN    Virtual Visit No    Medication changes reported     No    Fall or balance concerns reported    Yes    Comments He has had several falls this year. 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 He walks with a rollator.    Tobacco Cessation No Change    Warm-up and Cool-down Performed as group-led instruction    Resistance Training Performed Yes    VAD Patient? No    PAD/SET Patient? No      Pain Assessment   Currently in Pain? Yes    Pain Score 5     Pain Location Hip    Pain Orientation Right    Pain Descriptors / Indicators Constant    Pain Type Chronic pain    Pain Radiating Towards down legs    Pain Onset More than a month ago    Pain Frequency Constant    Multiple Pain Sites No             Capillary Blood Glucose: No results found for this or any previous visit (from the past 24 hour(s)).    Social History   Tobacco Use  Smoking Status Every Day   Packs/day: 0.50   Years: 40.00   Total pack years: 20.00   Types: Cigarettes   Passive exposure: Never  Smokeless Tobacco Never    Goals Met:  Independence with exercise equipment Exercise tolerated well No report of concerns or symptoms today Strength training completed today  Goals Unmet:    Comments: check out 1200   Dr. Carlyle Dolly is Medical Director for [place]

## 2022-11-30 ENCOUNTER — Encounter (HOSPITAL_COMMUNITY)
Admission: RE | Admit: 2022-11-30 | Discharge: 2022-11-30 | Disposition: A | Discharge status: Home / Self Care | Payer: Medicare Other | Source: Ambulatory Visit | Attending: Cardiology | Admitting: Cardiology

## 2022-11-30 DIAGNOSIS — Z955 Presence of coronary angioplasty implant and graft: Secondary | ICD-10-CM | POA: Diagnosis not present

## 2022-11-30 DIAGNOSIS — I2119 ST elevation (STEMI) myocardial infarction involving other coronary artery of inferior wall: Secondary | ICD-10-CM | POA: Diagnosis not present

## 2022-11-30 NOTE — Progress Notes (Signed)
Daily Session Note  Patient Details  Name: Leon Mitchell MRN: [id number] Date of Birth: 1956/03/17 Referring Provider:   Flowsheet Row CARDIAC REHAB PHASE II ORIENTATION from 11/15/2022 in [place]  Referring Provider Dr. Einar Gip       Encounter Date: 11/30/2022  Check In:  Session Check In - 11/30/22 1100       Check-In   Supervising physician immediately available to respond to emergencies [facility] MD immediately available    Physician(s) Dr. Dellia Cloud    Location [facility]    Staff Present Hoy Register MHA, MS, ACSM-CEP;Leana Roe, BS, Exercise Physiologist    Virtual Visit No    Medication changes reported     No    Fall or balance concerns reported    Yes    Comments He has had several falls this year. 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 He walks with a rollator.    Tobacco Cessation No Change    Warm-up and Cool-down Performed as group-led instruction    Resistance Training Performed Yes    VAD Patient? No    PAD/SET Patient? No      Pain Assessment   Currently in Pain? Yes    Pain Score 6     Pain Location Hip    Pain Orientation Right    Pain Descriptors / Indicators Constant    Pain Type Chronic pain    Pain Onset More than a month ago    Pain Frequency Constant    Aggravating Factors  ambulation    Multiple Pain Sites Yes      2nd Pain Site   Pain Score 6    Pain Location Knee    Pain Orientation Right;Left    Pain Descriptors / Indicators Contraction;Sore    Pain Frequency Constant      Pain   Pain Onset More than a month ago    Pain Onset More than a month ago             Capillary Blood Glucose: No results found for this or any previous visit (from the past 24 hour(s)).    Social History   Tobacco Use  Smoking Status Every Day   Packs/day: 0.50   Years: 40.00   Total pack years: 20.00   Types: Cigarettes   Passive  exposure: Never  Smokeless Tobacco Never    Goals Met:  Independence with exercise equipment Exercise tolerated well Strength training completed today  Goals Unmet:  Not Applicable  Comments: check out 1200   Dr. Carlyle Dolly is Medical Director for [place]

## 2022-12-02 ENCOUNTER — Encounter (HOSPITAL_COMMUNITY)
Admission: RE | Admit: 2022-12-02 | Discharge: 2022-12-02 | Disposition: A | Discharge status: Home / Self Care | Payer: Medicare Other | Source: Ambulatory Visit | Attending: Cardiology | Admitting: Cardiology

## 2022-12-02 DIAGNOSIS — Z955 Presence of coronary angioplasty implant and graft: Secondary | ICD-10-CM | POA: Diagnosis not present

## 2022-12-02 DIAGNOSIS — I2119 ST elevation (STEMI) myocardial infarction involving other coronary artery of inferior wall: Secondary | ICD-10-CM

## 2022-12-02 NOTE — Progress Notes (Signed)
Daily Session Note  Patient Details  Name: Leon Mitchell MRN: [id number] Date of Birth: 05-Aug-1956 Referring Provider:   Flowsheet Row CARDIAC REHAB PHASE II ORIENTATION from 11/15/2022 in [place]  Referring Provider Dr. Einar Gip       Encounter Date: 12/02/2022  Check In:  Session Check In - 12/02/22 1100       Check-In   Supervising physician immediately available to respond to emergencies [facility] MD immediately available    Physician(s) Dr. Dellia Cloud    Location [facility]    Staff Present Hoy Register MHA, MS, ACSM-CEP;Leana Roe, BS, Exercise Physiologist;Debra Wynetta Emery, RN, BSN    Virtual Visit No    Medication changes reported     No    Fall or balance concerns reported    Yes    Comments He has had several falls this year. 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 He walks with a rollator.    Tobacco Cessation No Change    Warm-up and Cool-down Performed as group-led instruction    Resistance Training Performed Yes    VAD Patient? No    PAD/SET Patient? No      Pain Assessment   Currently in Pain? Yes    Pain Score 7     Pain Location Hip    Pain Orientation Right;Left    Pain Descriptors / Indicators Constant    Pain Type Chronic pain    Pain Radiating Towards down legs    Pain Onset More than a month ago    Pain Frequency Constant    Aggravating Factors  ambulation    Pain Relieving Factors Tramadol, Tylenol    Multiple Pain Sites No             Capillary Blood Glucose: No results found for this or any previous visit (from the past 24 hour(s)).    Social History   Tobacco Use  Smoking Status Every Day   Packs/day: 0.50   Years: 40.00   Total pack years: 20.00   Types: Cigarettes   Passive exposure: Never  Smokeless Tobacco Never    Goals Met:  Independence with exercise equipment Exercise tolerated well No report of concerns  or symptoms today Strength training completed today  Goals Unmet:  Not Applicable  Comments: checkout time is 1200   Dr. Carlyle Dolly is Medical Director for [place]

## 2022-12-05 ENCOUNTER — Encounter (HOSPITAL_COMMUNITY)
Admission: RE | Admit: 2022-12-05 | Discharge: 2022-12-05 | Disposition: A | Discharge status: Home / Self Care | Payer: Medicare Other | Source: Ambulatory Visit | Attending: Cardiology | Admitting: Cardiology

## 2022-12-05 VITALS — Wt 216.1 lb

## 2022-12-05 DIAGNOSIS — Z955 Presence of coronary angioplasty implant and graft: Secondary | ICD-10-CM | POA: Diagnosis not present

## 2022-12-05 DIAGNOSIS — I2119 ST elevation (STEMI) myocardial infarction involving other coronary artery of inferior wall: Secondary | ICD-10-CM

## 2022-12-05 NOTE — Progress Notes (Signed)
Daily Session Note  Patient Details  Name: Leon Mitchell MRN: [id number] Date of Birth: Oct 31, 1955 Referring Provider:   Flowsheet Row CARDIAC REHAB PHASE II ORIENTATION from 11/15/2022 in [place]  Referring Provider Dr. Einar Gip       Encounter Date: 12/05/2022  Check In:  Session Check In - 12/05/22 1100       Check-In   Supervising physician immediately available to respond to emergencies [facility] MD immediately available    Physician(s) Dr Harl Bowie    Location [facility]    Staff Present Leana Roe, BS, Exercise Physiologist;Debra Wynetta Emery, RN, Joanette Gula, RN, BSN    Virtual Visit No    Fall or balance concerns reported    Yes    Comments He has had several falls this year. 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 He walks with a rollator.    Tobacco Cessation No Change    Warm-up and Cool-down Performed as group-led instruction    Resistance Training Performed Yes    VAD Patient? No    PAD/SET Patient? No      Pain Assessment   Currently in Pain? No/denies    Pain Score 0-No pain    Multiple Pain Sites No      2nd Pain Site   Pain Score 0             Capillary Blood Glucose: No results found for this or any previous visit (from the past 24 hour(s)).    Social History   Tobacco Use  Smoking Status Every Day   Packs/day: 0.50   Years: 40.00   Total pack years: 20.00   Types: Cigarettes   Passive exposure: Never  Smokeless Tobacco Never    Goals Met:  Independence with exercise equipment Exercise tolerated well No report of concerns or symptoms today Strength training completed today  Goals Unmet:  Not Applicable  Comments: Checkout at 1200.   Dr. Carlyle Dolly is Medical Director for [facility] Card[facility]

## 2022-12-07 ENCOUNTER — Encounter (HOSPITAL_COMMUNITY)
Admission: RE | Admit: 2022-12-07 | Discharge: 2022-12-07 | Disposition: A | Discharge status: Home / Self Care | Payer: Medicare Other | Source: Ambulatory Visit | Attending: Cardiology | Admitting: Cardiology

## 2022-12-07 DIAGNOSIS — Z955 Presence of coronary angioplasty implant and graft: Secondary | ICD-10-CM | POA: Diagnosis not present

## 2022-12-07 DIAGNOSIS — M25551 Pain in right hip: Secondary | ICD-10-CM | POA: Diagnosis not present

## 2022-12-07 DIAGNOSIS — I2119 ST elevation (STEMI) myocardial infarction involving other coronary artery of inferior wall: Secondary | ICD-10-CM | POA: Diagnosis not present

## 2022-12-07 NOTE — Progress Notes (Signed)
Cardiac Individual Treatment Plan  Patient Details  Name: Leon Mitchell MRN: [id number] Date of Birth: June 10, 1956 Referring Provider:   Flowsheet Row CARDIAC REHAB PHASE II ORIENTATION from 11/15/2022 in [place]  Referring Provider Dr. Einar Gip       Initial Encounter Date:  Flowsheet Row CARDIAC REHAB PHASE II ORIENTATION from 11/15/2022 in [place]  Date 11/15/22       Visit Diagnosis: Status post coronary artery stent placement  Acute ST elevation myocardial infarction (STEMI) of inferolateral wall (HCC)  Patient's Home Medications on Admission:  Current Outpatient Medications:    aspirin EC 81 MG tablet, Take 81 mg by mouth in the morning. Swallow whole., Disp: , Rfl:    atorvastatin (LIPITOR) 20 MG tablet, Take 1 tablet (20 mg total) by mouth daily., Disp: 90 tablet, Rfl: 1   cyclobenzaprine (FLEXERIL) 10 MG tablet, Take 1 tablet (10 mg total) by mouth 3 (three) times daily as needed for muscle spasms., Disp: 30 tablet, Rfl: 0   ezetimibe (ZETIA) 10 MG tablet, Take 1 tablet (10 mg total) by mouth daily., Disp: 90 tablet, Rfl: 1   gabapentin (NEURONTIN) 300 MG capsule, Take 600 mg by mouth 3 (three) times daily., Disp: , Rfl:    HYDROcodone-acetaminophen (NORCO/VICODIN) 5-325 MG tablet, Take 1 tablet by mouth 3 (three) times daily as needed for moderate pain., Disp: , Rfl:    lisinopril (ZESTRIL) 20 MG tablet, Take 1 tablet (20 mg total) by mouth daily., Disp: 90 tablet, Rfl: 3   metoprolol succinate (TOPROL-XL) 25 MG 24 hr tablet, Take 1 tablet (25 mg total) by mouth daily., Disp: 90 tablet, Rfl: 1   nitroGLYCERIN (NITROSTAT) 0.4 MG SL tablet, Place 1 tablet (0.4 mg total) under the tongue every 5 (five) minutes as needed for up to 25 days for chest pain., Disp: 25 tablet, Rfl: 3   prasugrel (EFFIENT) 10 MG TABS tablet, Take 1 tablet (10 mg total) by mouth daily., Disp: 90 tablet, Rfl: 3   tamsulosin (FLOMAX) 0.4 MG CAPS capsule, Take  0.4 mg by mouth daily., Disp: , Rfl:    traMADol (ULTRAM) 50 MG tablet, Take 1 tablet (50 mg total) by mouth 2 (two) times daily as needed. (Patient taking differently: Take 50 mg by mouth 2 (two) times daily as needed for moderate pain.), Disp: 60 tablet, Rfl: 1   Vitamin D, Ergocalciferol, (DRISDOL) 1.25 MG (50000 UNIT) CAPS capsule, Take 50,000 Units by mouth every Sunday., Disp: , Rfl:   Past Medical History: Past Medical History:  Diagnosis Date   Bulging lumbar disc    GERD (gastroesophageal reflux disease)    History of kidney stones    Hypertension    Pneumonia    May 2023    Tobacco Use: Social History   Tobacco Use  Smoking Status Every Day   Packs/day: 0.50   Years: 40.00   Total pack years: 20.00   Types: Cigarettes   Passive exposure: Never  Smokeless Tobacco Never    Labs: Review Flowsheet       Latest Ref Rng & Units 04/20/2022 10/18/2022 10/19/2022  Labs for ITP Cardiac and Pulmonary Rehab  Cholestrol 0 - 200 mg/dL 192  211  203   LDL (calc) 0 - 99 mg/dL 137  159  147   HDL-C >40 mg/dL 34  35  41   Trlycerides <150 mg/dL 104  84  75   Hemoglobin A1c 4.8 - 5.6 % - 5.1  -  TCO2  22 - 32 mmol/L - 24  -    Capillary Blood Glucose: No results found for: "GLUCAP"   Exercise Target Goals: Exercise Program Goal: Individual exercise prescription set using results from initial 6 min walk test and THRR while considering  patient's activity barriers and safety.   Exercise Prescription Goal: Starting with aerobic activity 30 plus minutes a day, 3 days per week for initial exercise prescription. Provide home exercise prescription and guidelines that participant acknowledges understanding prior to discharge.  Activity Barriers & Risk Stratification:  Activity Barriers & Cardiac Risk Stratification - 11/15/22 1327       Activity Barriers & Cardiac Risk Stratification   Activity Barriers Arthritis;Back Problems;Neck/Spine Problems;Joint  Problems;Deconditioning;Muscular Weakness;Balance Concerns;History of Falls;Assistive Device    Cardiac Risk Stratification High             6 Minute Walk:  6 Minute Walk     Row Name 11/15/22 1445         6 Minute Walk   Phase Initial     Distance 650 feet     Walk Time 6 minutes     # of Rest Breaks 0     MPH 1.23     METS 1.35     RPE 12     VO2 Peak 4.74     Symptoms Yes (comment)     Comments Arms fatigued due to holding himself up     Resting HR 50 bpm     Resting BP 124/60     Resting Oxygen Saturation  97 %     Exercise Oxygen Saturation  during 6 min walk 99 %     Max Ex. HR 60 bpm     Max Ex. BP 128/64     2 Minute Post BP 118/60              Oxygen Initial Assessment:   Oxygen Re-Evaluation:   Oxygen Discharge (Final Oxygen Re-Evaluation):   Initial Exercise Prescription:  Initial Exercise Prescription - 11/15/22 1400       Date of Initial Exercise RX and Referring Provider   Date 11/15/22    Referring Provider Dr. Einar Gip    Expected Discharge Date 02/10/23      NuStep   Level 1    SPM 60    Minutes 22      Arm Ergometer   Level 1    RPM 45    Minutes 17      Prescription Details   Frequency (times per week) 3    Duration Progress to 30 minutes of continuous aerobic without signs/symptoms of physical distress      Intensity   THRR 40-80% of Max Heartrate 61-122    Ratings of Perceived Exertion 11-13      Resistance Training   Training Prescription Yes    Weight 4    Reps 10-15             Perform Capillary Blood Glucose checks as needed.  Exercise Prescription Changes:   Exercise Prescription Changes     Row Name 11/21/22 1200 12/05/22 1400           Response to Exercise   Blood Pressure (Admit) 114/60 114/68      Blood Pressure (Exercise) 120/60 108/66      Blood Pressure (Exit) 110/56 114/74      Heart Rate (Admit) 55 bpm 84 bpm      Heart Rate (Exercise) 57 bpm 106 bpm  Heart Rate (Exit) 51 bpm  82 bpm      Rating of Perceived Exertion (Exercise) 11 11      Duration Continue with 30 min of aerobic exercise without signs/symptoms of physical distress. Continue with 30 min of aerobic exercise without signs/symptoms of physical distress.      Intensity THRR unchanged THRR unchanged        Progression   Progression Continue to progress workloads to maintain intensity without signs/symptoms of physical distress. Continue to progress workloads to maintain intensity without signs/symptoms of physical distress.        Resistance Training   Training Prescription -- Yes      Weight -- 4      Reps -- 10-15      Time -- 10 Minutes        NuStep   Level 1 3      SPM 50 76      Minutes 17 17      METs 1.81 1.87        Arm Ergometer   Level 1 3      RPM 52 50      Minutes 22 22      METs 1.64 2.07               Exercise Comments:   Exercise Goals and Review:   Exercise Goals     Row Name 11/15/22 1447 12/05/22 1416           Exercise Goals   Increase Physical Activity Yes Yes      Intervention Provide advice, education, support and counseling about physical activity/exercise needs.;Develop an individualized exercise prescription for aerobic and resistive training based on initial evaluation findings, risk stratification, comorbidities and participant's personal goals. Provide advice, education, support and counseling about physical activity/exercise needs.;Develop an individualized exercise prescription for aerobic and resistive training based on initial evaluation findings, risk stratification, comorbidities and participant's personal goals.      Expected Outcomes Short Term: Attend rehab on a regular basis to increase amount of physical activity.;Long Term: Add in home exercise to make exercise part of routine and to increase amount of physical activity.;Long Term: Exercising regularly at least 3-5 days a week. Short Term: Attend rehab on a regular basis to increase amount  of physical activity.;Long Term: Add in home exercise to make exercise part of routine and to increase amount of physical activity.;Long Term: Exercising regularly at least 3-5 days a week.      Increase Strength and Stamina Yes Yes      Intervention Provide advice, education, support and counseling about physical activity/exercise needs.;Develop an individualized exercise prescription for aerobic and resistive training based on initial evaluation findings, risk stratification, comorbidities and participant's personal goals. Provide advice, education, support and counseling about physical activity/exercise needs.;Develop an individualized exercise prescription for aerobic and resistive training based on initial evaluation findings, risk stratification, comorbidities and participant's personal goals.      Expected Outcomes Short Term: Increase workloads from initial exercise prescription for resistance, speed, and METs.;Short Term: Perform resistance training exercises routinely during rehab and add in resistance training at home;Long Term: Improve cardiorespiratory fitness, muscular endurance and strength as measured by increased METs and functional capacity (6MWT) Short Term: Increase workloads from initial exercise prescription for resistance, speed, and METs.;Short Term: Perform resistance training exercises routinely during rehab and add in resistance training at home;Long Term: Improve cardiorespiratory fitness, muscular endurance and strength as measured by increased METs and functional capacity (6MWT)  Able to understand and use rate of perceived exertion (RPE) scale Yes Yes      Intervention Provide education and explanation on how to use RPE scale Provide education and explanation on how to use RPE scale      Expected Outcomes Short Term: Able to use RPE daily in rehab to express subjective intensity level;Long Term:  Able to use RPE to guide intensity level when exercising independently Short  Term: Able to use RPE daily in rehab to express subjective intensity level;Long Term:  Able to use RPE to guide intensity level when exercising independently      Knowledge and understanding of Target Heart Rate Range (THRR) Yes Yes      Intervention Provide education and explanation of THRR including how the numbers were predicted and where they are located for reference Provide education and explanation of THRR including how the numbers were predicted and where they are located for reference      Expected Outcomes Short Term: Able to state/look up THRR;Long Term: Able to use THRR to govern intensity when exercising independently;Short Term: Able to use daily as guideline for intensity in rehab Short Term: Able to state/look up THRR;Long Term: Able to use THRR to govern intensity when exercising independently;Short Term: Able to use daily as guideline for intensity in rehab      Able to check pulse independently Yes --      Intervention Provide education and demonstration on how to check pulse in carotid and radial arteries.;Review the importance of being able to check your own pulse for safety during independent exercise Provide education and demonstration on how to check pulse in carotid and radial arteries.;Review the importance of being able to check your own pulse for safety during independent exercise      Expected Outcomes Short Term: Able to explain why pulse checking is important during independent exercise;Long Term: Able to check pulse independently and accurately Short Term: Able to explain why pulse checking is important during independent exercise;Long Term: Able to check pulse independently and accurately      Understanding of Exercise Prescription Yes Yes      Intervention Provide education, explanation, and written materials on patient's individual exercise prescription Provide education, explanation, and written materials on patient's individual exercise prescription      Expected  Outcomes Short Term: Able to explain program exercise prescription;Long Term: Able to explain home exercise prescription to exercise independently Short Term: Able to explain program exercise prescription;Long Term: Able to explain home exercise prescription to exercise independently               Exercise Goals Re-Evaluation :  Exercise Goals Re-Evaluation     [place] Name 12/05/22 1417             Exercise Goal Re-Evaluation   Exercise Goals Review Increase Physical Activity;Increase Strength and Stamina;Able to understand and use rate of perceived exertion (RPE) scale;Knowledge and understanding of Target Heart Rate Range (THRR);Able to check pulse independently;Understanding of Exercise Prescription       Comments Pt has completed 8 sessions of cardiac rehab. He is deconditioned due to hip and knee pain and but is starting to progress. He uses his rollator during warm uop for stability. He is curretnly exercising at 2.07 METs on the arm ergometer. Will continue to montior and progress as able.       Expected Outcomes Through exercise at rehab and home, the patient will meet thier stated goals.  Discharge Exercise Prescription (Final Exercise Prescription Changes):  Exercise Prescription Changes - 12/05/22 1400       Response to Exercise   Blood Pressure (Admit) 114/68    Blood Pressure (Exercise) 108/66    Blood Pressure (Exit) 114/74    Heart Rate (Admit) 84 bpm    Heart Rate (Exercise) 106 bpm    Heart Rate (Exit) 82 bpm    Rating of Perceived Exertion (Exercise) 11    Duration Continue with 30 min of aerobic exercise without signs/symptoms of physical distress.    Intensity THRR unchanged      Progression   Progression Continue to progress workloads to maintain intensity without signs/symptoms of physical distress.      Resistance Training   Training Prescription Yes    Weight 4    Reps 10-15    Time 10 Minutes      NuStep   Level 3    SPM 76     Minutes 17    METs 1.87      Arm Ergometer   Level 3    RPM 50    Minutes 22    METs 2.07             Nutrition:  Target Goals: Understanding of nutrition guidelines, daily intake of sodium '1500mg'$ , cholesterol '200mg'$ , calories 30% from fat and 7% or less from saturated fats, daily to have 5 or more servings of fruits and vegetables.  Biometrics:  Pre Biometrics - 11/15/22 1448       Pre Biometrics   Height '5\' 11"'$  (1.803 m)    Weight 98.6 kg    Waist Circumference 41 inches    Hip Circumference 39 inches    Waist to Hip Ratio 1.05 %    BMI (Calculated) 30.33    Triceps Skinfold 8 mm    % Body Fat 26.1 %    Grip Strength 29.4 kg    Flexibility 0 in    Single Leg Stand 0 seconds              Nutrition Therapy Plan and Nutrition Goals:  Nutrition Therapy & Goals - 11/16/22 0653       Personal Nutrition Goals   Comments Patient's diet assessment score was 50. We offer educational sessions on heart healthy nutrition with handouts and assistance with RD referral if patient is interested.      Intervention Plan   Intervention Nutrition handout(s) given to patient.    Expected Outcomes Short Term Goal: Understand basic principles of dietary content, such as calories, fat, sodium, cholesterol and nutrients.             Nutrition Assessments:  Nutrition Assessments - 11/15/22 1337       MEDFICTS Scores   Pre Score 50            MEDIFICTS Score Key: ?70 Need to make dietary changes  40-70 Heart Healthy Diet ? 40 Therapeutic Level Cholesterol Diet   Picture Your Plate Scores: [id number] Unhealthy dietary pattern with much room for improvement. 41-50 Dietary pattern unlikely to meet recommendations for good health and room for improvement. 51-60 More healthful dietary pattern, with some room for improvement.  >60 Healthy dietary pattern, although there may be some specific behaviors that could be improved.    Nutrition Goals  Re-Evaluation:   Nutrition Goals Discharge (Final Nutrition Goals Re-Evaluation):   Psychosocial: Target Goals: Acknowledge presence or absence of significant depression and/or stress, maximize coping skills, provide positive support system. Participant  is able to verbalize types and ability to use techniques and skills needed for reducing stress and depression.  Initial Review & Psychosocial Screening:  Initial Psych Review & Screening - 11/15/22 1328       Initial Review   Current issues with Current Sleep Concerns      Family Dynamics   Good Support System? Yes    Comments His girlfriend supports him.      Barriers   Psychosocial barriers to participate in program There are no identifiable barriers or psychosocial needs.      Screening Interventions   Interventions Encouraged to exercise    Expected Outcomes Long Term goal: The participant improves quality of Life and PHQ9 Scores as seen by post scores and/or verbalization of changes;Short Term goal: Identification and review with participant of any Quality of Life or Depression concerns found by scoring the questionnaire.             Quality of Life Scores:  Quality of Life - 11/15/22 1448       Quality of Life   Select Quality of Life      Quality of Life Scores   Health/Function Pre 15.97 %    Socioeconomic Pre 22.08 %    Psych/Spiritual Pre 19.21 %    Family Pre 28.3 %    GLOBAL Pre 19.64 %            Scores of 19 and below usually indicate a poorer quality of life in these areas.  A difference of  2-3 points is a clinically meaningful difference.  A difference of 2-3 points in the total score of the Quality of Life Index has been associated with significant improvement in overall quality of life, self-image, physical symptoms, and general health in studies assessing change in quality of life.  PHQ-9: Review Flowsheet       11/15/2022  Depression screen PHQ 2/9  Decreased Interest 3  Down, Depressed,  Hopeless 1  PHQ - 2 Score 4  Altered sleeping 1  Tired, decreased energy 3  Change in appetite 1  Feeling bad or failure about yourself  0  Trouble concentrating 1  Moving slowly or fidgety/restless 0  Suicidal thoughts 0  PHQ-9 Score 10  Difficult doing work/chores Somewhat difficult   Interpretation of Total Score  Total Score Depression Severity:  1-4 = Minimal depression, 5-9 = Mild depression, 10-14 = Moderate depression, 15-19 = Moderately severe depression, 20-27 = Severe depression   Psychosocial Evaluation and Intervention:  Psychosocial Evaluation - 11/15/22 1423       Psychosocial Evaluation & Interventions   Interventions Relaxation education;Stress management education;Encouraged to exercise with the program and follow exercise prescription    Comments Pt has no barriers to participate in CR. He reports altered sleep, and he has just started to take melatonin for this. He has no other identifiable psychosocial issues. He did score a 10 on his PHQ-9, and he relates this to his health and denies depression. He states he has little interest in doing things and that he feels tired almost every day. This is due to his severe orthopedic issues. 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 All of this has been delayed until at least late summer due to his recent STEMI and stent. The pain that this causes has made him sedentary which has further worsened his energy levels. He continues to smoke 5-10 cigarettes per day, which is down from the 1 pack per day he  was smoking before his STEMI. He is wearing a nicotine patch and reports some benefit. He admits that the reason he cannot quit is due to lack of willpower. He knows that he must get his mind ready to quit before he can do so. He did quit some years ago while taking Chantix, but he does not want to take Chantix again. He states that he is already taking enough pills as it is. He also admits that his lack of willpower is why  he is still eating an unhealthy diet. He reports making no dietary changes since his STEMI, even though he knows he should. He hopes  that this program will help give him a push in the right direction. His goals while in the program are to improve his walking abillities and to be able to do more things that he enjoys. He was unsure if he would be able to do the program due to his orthopedic limitations, but he was able to do the arm ergometer and stepper at orientation. He is eager to start the program.    Expected Outcomes Pt will continue to have no identifiable psychosocial issues.    Continue Psychosocial Services  No Follow up required             Psychosocial Re-Evaluation:  Psychosocial Re-Evaluation     [place] Name 11/28/22 0815             Psychosocial Re-Evaluation   Current issues with Current Sleep Concerns       Comments Patient is new to the program. He has completed 4 sessions. He continues to have no psychosocial barriers identified. He seems to enjoy the sessions and demonstrates an interest in improving his health. We will continue to monitor his progress.       Expected Outcomes Patient will continue to have no psychosocial barriers identified.       Interventions Stress management education;Relaxation education;Encouraged to attend Cardiac Rehabilitation for the exercise       Continue Psychosocial Services  No Follow up required                Psychosocial Discharge (Final Psychosocial Re-Evaluation):  Psychosocial Re-Evaluation - 11/28/22 0815       Psychosocial Re-Evaluation   Current issues with Current Sleep Concerns    Comments Patient is new to the program. He has completed 4 sessions. He continues to have no psychosocial barriers identified. He seems to enjoy the sessions and demonstrates an interest in improving his health. We will continue to monitor his progress.    Expected Outcomes Patient will continue to have no psychosocial barriers identified.     Interventions Stress management education;Relaxation education;Encouraged to attend Cardiac Rehabilitation for the exercise    Continue Psychosocial Services  No Follow up required             Vocational Rehabilitation: Provide vocational rehab assistance to qualifying candidates.   Vocational Rehab Evaluation & Intervention:  Vocational Rehab - 11/15/22 1353       Initial Vocational Rehab Evaluation & Intervention   Assessment shows need for Vocational Rehabilitation No      Vocational Rehab Re-Evaulation   Comments retired             Education: Education Goals: Education classes will be provided on a weekly basis, covering required topics. Participant will state understanding/return demonstration of topics presented.  Learning Barriers/Preferences:  Learning Barriers/Preferences - 11/15/22 1341       Learning Barriers/Preferences  Learning Barriers None    Learning Preferences Skilled Demonstration;Written Material             Education Topics: Hypertension, Hypertension Reduction -Define heart disease and high blood pressure. Discus how high blood pressure affects the body and ways to reduce high blood pressure.   Exercise and Your Heart -Discuss why it is important to exercise, the FITT principles of exercise, normal and abnormal responses to exercise, and how to exercise safely.   Angina -Discuss definition of angina, causes of angina, treatment of angina, and how to decrease risk of having angina.   Cardiac Medications -Review what the following cardiac medications are used for, how they affect the body, and side effects that may occur when taking the medications.  Medications include Aspirin, Beta blockers, calcium channel blockers, ACE Inhibitors, angiotensin receptor blockers, diuretics, digoxin, and antihyperlipidemics.   Congestive Heart Failure -Discuss the definition of CHF, how to live with CHF, the signs and symptoms of CHF, and how  keep track of weight and sodium intake.   Heart Disease and Intimacy -Discus the effect sexual activity has on the heart, how changes occur during intimacy as we age, and safety during sexual activity. Flowsheet Row CARDIAC REHAB PHASE II EXERCISE from 11/30/2022 in [place]  Date 11/23/22  Educator DF  Instruction Review Code 1- Verbalizes Understanding       Smoking Cessation / COPD -Discuss different methods to quit smoking, the health benefits of quitting smoking, and the definition of COPD. Flowsheet Row CARDIAC REHAB PHASE II EXERCISE from 11/30/2022 in [place]  Date 11/30/22  Educator HB  Instruction Review Code 1- Verbalizes Understanding       Nutrition I: Fats -Discuss the types of cholesterol, what cholesterol does to the heart, and how cholesterol levels can be controlled.   Nutrition II: Labels -Discuss the different components of food labels and how to read food label   Heart Parts/Heart Disease and PAD -Discuss the anatomy of the heart, the pathway of blood circulation through the heart, and these are affected by heart disease.   Stress I: Signs and Symptoms -Discuss the causes of stress, how stress may lead to anxiety and depression, and ways to limit stress.   Stress II: Relaxation -Discuss different types of relaxation techniques to limit stress.   Warning Signs of Stroke / TIA -Discuss definition of a stroke, what the signs and symptoms are of a stroke, and how to identify when someone is having stroke.   Knowledge Questionnaire Score:  Knowledge Questionnaire Score - 11/15/22 1339       Knowledge Questionnaire Score   Pre Score 23/28             Core Components/Risk Factors/Patient Goals at Admission:  Personal Goals and Risk Factors at Admission - 11/15/22 1353       Core Components/Risk Factors/Patient Goals on Admission   Tobacco Cessation Yes    Number of packs per day 1/4-1/2     Intervention Assist the participant in steps to quit. Provide individualized education and counseling about committing to Tobacco Cessation, relapse prevention, and pharmacological support that can be provided by physician.;Advice worker, assist with locating and accessing local/national Quit Smoking programs, and support quit date choice.    Expected Outcomes Short Term: Will demonstrate readiness to quit, by selecting a quit date.;Short Term: Will quit all tobacco product use, adhering to prevention of relapse plan.;Long Term: Complete abstinence from all tobacco products for at least 12  months from quit date.    Improve shortness of breath with ADL's Yes    Intervention Provide education, individualized exercise plan and daily activity instruction to help decrease symptoms of SOB with activities of daily living.    Expected Outcomes Short Term: Improve cardiorespiratory fitness to achieve a reduction of symptoms when performing ADLs;Long Term: Be able to perform more ADLs without symptoms or delay the onset of symptoms    Hypertension Yes    Intervention Provide education on lifestyle modifcations including regular physical activity/exercise, weight management, moderate sodium restriction and increased consumption of fresh fruit, vegetables, and low fat dairy, alcohol moderation, and smoking cessation.;Monitor prescription use compliance.    Expected Outcomes Short Term: Continued assessment and intervention until BP is < 140/41m HG in hypertensive participants. < 130/827mHG in hypertensive participants with diabetes, heart failure or chronic kidney disease.;Long Term: Maintenance of blood pressure at goal levels.    Lipids Yes    Intervention Provide education and support for participant on nutrition & aerobic/resistive exercise along with prescribed medications to achieve LDL '70mg'$ , HDL >'40mg'$ .    Expected Outcomes Short Term: Participant states understanding of desired cholesterol  values and is compliant with medications prescribed. Participant is following exercise prescription and nutrition guidelines.;Long Term: Cholesterol controlled with medications as prescribed, with individualized exercise RX and with personalized nutrition plan. Value goals: LDL < '70mg'$ , HDL > 40 mg.    Personal Goal Other Yes    Personal Goal Improve walking endurance and be able to do more things that he enjoys.    Intervention Attend CR three days per week and begin a home exercise program.    Expected Outcomes Pt will meet stated goal.             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 He has multiple risk factors for CAD and is participating in the program for risk modification. He has completed 4 sessions with his current weight at 217.6 lbs. His blood pressure is well controlled. He is wearing a nicotine patch to help with smoking cessation and continues to smoke 5-10 cigarettes/day. His personal goals for the program are to improve his walking ability; be able to do more things he enjoys and get healthier. We will continue to monitor his progress as he works towards meeting these goals.       Expected Outcomes Patient will complete the program meeting both personal and program goals.                Core Components/Risk Factors/Patient Goals at Discharge (Final Review):   Goals and Risk Factor Review - 11/28/22 0819       Core Components/Risk Factors/Patient Goals Review   Personal Goals Review Improve shortness of breath with ADL's;Tobacco Cessation;Hypertension;Lipids;Other    Review Patient was referred to CR with STEMI and stent placement. He has multiple risk  factors for CAD and is participating in the program for risk modification. He has completed 4 sessions with his current weight at 217.6 lbs. His blood pressure is well controlled. He is wearing a nicotine patch to help with smoking cessation and continues to smoke 5-10 cigarettes/day. His personal  goals for the program are to improve his walking ability; be able to do more things he enjoys and get healthier. We will continue to monitor his progress as he works towards meeting these goals.    Expected Outcomes Patient will complete the program meeting both personal and program goals.             ITP Comments:   Comments: ITP REVIEW Pt is making expected progress toward Cardiac Rehab goals after completing 8 sessions. Recommend continued exercise, life style modification, education, and increased stamina and strength.

## 2022-12-07 NOTE — Progress Notes (Signed)
Daily Session Note  Patient Details  Name: Leon Mitchell MRN: [id number] Date of Birth: 07/23/1956 Referring Provider:   Flowsheet Row CARDIAC REHAB PHASE II ORIENTATION from 11/15/2022 in [place]  Referring Provider Dr. Einar Gip       Encounter Date: 12/07/2022  Check In:  Session Check In - 12/07/22 1100       Check-In   Supervising physician immediately available to respond to emergencies [facility] MD immediately available    Physician(s) Dr Harl Bowie    Location [facility]    Staff Present Leana Roe, BS, Exercise Physiologist;Debra Wynetta Emery, RN, BSN    Virtual Visit No    Medication changes reported     No    Fall or balance concerns reported    Yes    Comments He has had several falls this year. 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 He walks with a rollator.    Tobacco Cessation No Change    Warm-up and Cool-down Performed as group-led instruction    Resistance Training Performed Yes    VAD Patient? No    PAD/SET Patient? No      Pain Assessment   Currently in Pain? Yes    Pain Score 5     Pain Location Hip    Pain Orientation Right;Left    Pain Descriptors / Indicators Constant    Pain Type Chronic pain    Pain Radiating Towards down leg    Pain Onset More than a month ago    Pain Frequency Constant    Multiple Pain Sites No             Capillary Blood Glucose: No results found for this or any previous visit (from the past 24 hour(s)).    Social History   Tobacco Use  Smoking Status Every Day   Packs/day: 0.50   Years: 40.00   Total pack years: 20.00   Types: Cigarettes   Passive exposure: Never  Smokeless Tobacco Never    Goals Met:  Independence with exercise equipment Exercise tolerated well No report of concerns or symptoms today Strength training completed today  Goals Unmet:  Not Applicable  Comments: check out  1200    Dr. Carlyle Dolly is Medical Director for [facility]

## 2022-12-08 ENCOUNTER — Encounter: Payer: Self-pay | Admitting: Radiology

## 2022-12-09 ENCOUNTER — Encounter (HOSPITAL_COMMUNITY)
Admission: RE | Admit: 2022-12-09 | Discharge: 2022-12-09 | Disposition: A | Discharge status: Home / Self Care | Payer: Medicare Other | Source: Ambulatory Visit | Attending: Cardiology | Admitting: Cardiology

## 2022-12-09 DIAGNOSIS — Z955 Presence of coronary angioplasty implant and graft: Secondary | ICD-10-CM

## 2022-12-09 DIAGNOSIS — I2119 ST elevation (STEMI) myocardial infarction involving other coronary artery of inferior wall: Secondary | ICD-10-CM | POA: Diagnosis not present

## 2022-12-09 NOTE — Progress Notes (Signed)
Daily Session Note  Patient Details  Name: Leon Mitchell MRN: [id number] Date of Birth: 19-Oct-1955 Referring Provider:   Flowsheet Row CARDIAC REHAB PHASE II ORIENTATION from 11/15/2022 in [place]  Referring Provider Dr. Einar Gip       Encounter Date: 12/09/2022  Check In:  Session Check In - 12/09/22 1054       Check-In   Supervising physician immediately available to respond to emergencies [facility] immediately available    Physician(s) Dr Harl Bowie    Location [facility]    Staff Present Leana Roe, BS, Exercise Physiologist;Debra Wynetta Emery, RN, Joanette Gula, RN, BSN    Virtual Visit No    Medication changes reported     No    Fall or balance concerns reported    Yes    Comments He has had several falls this year. 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 He walks with a rollator.    Tobacco Cessation No Change    Warm-up and Cool-down Performed as group-led instruction    Resistance Training Performed Yes    VAD Patient? No    PAD/SET Patient? No      Pain Assessment   Currently in Pain? No/denies    Pain Score 0-No pain    Multiple Pain Sites No      2nd Pain Site   Pain Score 0             Capillary Blood Glucose: No results found for this or any previous visit (from the past 24 hour(s)).    Social History   Tobacco Use  Smoking Status Every Day   Packs/day: 0.50   Years: 40.00   Total pack years: 20.00   Types: Cigarettes   Passive exposure: Never  Smokeless Tobacco Never    Goals Met:  Independence with exercise equipment Exercise tolerated well No report of concerns or symptoms today Strength training completed today  Goals Unmet:  Not Applicable  Comments: Checkout at 1200.   Dr. Carlyle Dolly is Medical Director for [facility]

## 2022-12-12 ENCOUNTER — Encounter (HOSPITAL_COMMUNITY)
Admission: RE | Admit: 2022-12-12 | Discharge: 2022-12-12 | Disposition: A | Discharge status: Home / Self Care | Payer: Medicare Other | Source: Ambulatory Visit | Attending: Cardiology | Admitting: Cardiology

## 2022-12-12 DIAGNOSIS — I2119 ST elevation (STEMI) myocardial infarction involving other coronary artery of inferior wall: Secondary | ICD-10-CM

## 2022-12-12 DIAGNOSIS — Z955 Presence of coronary angioplasty implant and graft: Secondary | ICD-10-CM

## 2022-12-12 NOTE — Progress Notes (Signed)
Daily Session Note  Patient Details  Name: Leon Mitchell MRN: [id number] Date of Birth: 1955-12-13 Referring Provider:   Flowsheet Row CARDIAC REHAB PHASE II ORIENTATION from 11/15/2022 in [place]  Referring Provider Dr. Einar Gip       Encounter Date: 12/12/2022  Check In:  Session Check In - 12/12/22 1053       Check-In   Supervising physician immediately available to respond to emergencies [facility] immediately available    Physician(s) Dr Harl Bowie    Location [facility]    Staff Present Leana Roe, BS, Exercise Physiologist;Devri Kreher Hassell Done, RN, BSN    Virtual Visit No    Medication changes reported     No    Fall or balance concerns reported    Yes    Comments He has had several falls this year. 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 He walks with a rollator.    Tobacco Cessation No Change    Warm-up and Cool-down Performed as group-led instruction    Resistance Training Performed Yes    VAD Patient? No    PAD/SET Patient? No      Pain Assessment   Currently in Pain? No/denies    Pain Score 0-No pain    Multiple Pain Sites No      3rd Pain Site   Pain Score 0             Capillary Blood Glucose: No results found for this or any previous visit (from the past 24 hour(s)).    Social History   Tobacco Use  Smoking Status Every Day   Packs/day: 0.50   Years: 40.00   Total pack years: 20.00   Types: Cigarettes   Passive exposure: Never  Smokeless Tobacco Never    Goals Met:  Independence with exercise equipment Exercise tolerated well No report of concerns or symptoms today Strength training completed today  Goals Unmet:  Not Applicable  Comments: Checkout at 1200.   Dr. Carlyle Dolly is Medical Director for [facility]

## 2022-12-14 ENCOUNTER — Encounter (HOSPITAL_COMMUNITY)
Admission: RE | Admit: 2022-12-14 | Discharge: 2022-12-14 | Disposition: A | Discharge status: Home / Self Care | Payer: Medicare Other | Source: Ambulatory Visit | Attending: Cardiology | Admitting: Cardiology

## 2022-12-14 DIAGNOSIS — I2119 ST elevation (STEMI) myocardial infarction involving other coronary artery of inferior wall: Secondary | ICD-10-CM

## 2022-12-14 DIAGNOSIS — Z955 Presence of coronary angioplasty implant and graft: Secondary | ICD-10-CM | POA: Diagnosis not present

## 2022-12-14 NOTE — Progress Notes (Signed)
Daily Session Note  Patient Details  Name: Leon Mitchell MRN: [id number] Date of Birth: [birth year] Referring Provider:   Flowsheet Row CARDIAC REHAB PHASE II ORIENTATION from 11/15/2022 in [place]  Referring Provider Dr. Einar Gip       Encounter Date: 12/14/2022  Check In:  Session Check In - 12/14/22 1059       Check-In   Supervising physician immediately available to respond to emergencies [facility] immediately available    Physician(s) Dr Harl Bowie    Location [facility]    Staff Present Leana Roe, BS, Exercise Physiologist;Hillary Troutman BSN, RN    Virtual Visit No    Medication changes reported     No    Fall or balance concerns reported    Yes    Comments He has had several falls this year. 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 He walks with a rollator.    Tobacco Cessation No Change    Warm-up and Cool-down Performed as group-led instruction    Resistance Training Performed Yes    VAD Patient? No    PAD/SET Patient? No      Pain Assessment   Currently in Pain? Yes    Pain Score 5     Pain Location Hip    Pain Orientation Right    Pain Descriptors / Indicators Constant    Pain Type Chronic pain    Pain Radiating Towards down the stabbing when walking    Pain Onset More than a month ago    Pain Frequency Constant    Aggravating Factors  ambulation    Multiple Pain Sites Yes      2nd Pain Site   Pain Score 5    Pain Type Chronic pain    Pain Location Knee    Pain Orientation Right;Left    Pain Descriptors / Indicators Constant    Pain Frequency Constant      Pain Screening   Pain Radiating Towards knee             Capillary Blood Glucose: No results found for this or any previous visit (from the past 24 hour(s)).    Social History   Tobacco Use  Smoking Status Every Day   Packs/day: 0.50   Years: 40.00   Total pack years: 20.00    Types: Cigarettes   Passive exposure: Never  Smokeless Tobacco Never    Goals Met:  Independence with exercise equipment Exercise tolerated well No report of concerns or symptoms today Strength training completed today  Goals Unmet:  Not Applicable  Comments: check out 1200   Dr. Carlyle Dolly is Medical Director for [place]

## 2022-12-15 ENCOUNTER — Telehealth: Payer: Self-pay

## 2022-12-15 DIAGNOSIS — H35033 Hypertensive retinopathy, bilateral: Secondary | ICD-10-CM | POA: Diagnosis not present

## 2022-12-15 NOTE — Telephone Encounter (Signed)
Patient is calling because his statin is causing him to be in a ton of pain. He wants to switch to a different medication.

## 2022-12-15 NOTE — Telephone Encounter (Signed)
Please ask him that if he can take atorvastatin every other day or just 1/2 tablet daily

## 2022-12-16 ENCOUNTER — Other Ambulatory Visit: Payer: Self-pay | Admitting: Cardiology

## 2022-12-16 ENCOUNTER — Encounter (HOSPITAL_COMMUNITY)
Admission: RE | Admit: 2022-12-16 | Discharge: 2022-12-16 | Disposition: A | Discharge status: Home / Self Care | Payer: Medicare Other | Source: Ambulatory Visit | Attending: Cardiology | Admitting: Cardiology

## 2022-12-16 ENCOUNTER — Other Ambulatory Visit: Payer: Self-pay

## 2022-12-16 DIAGNOSIS — M1611 Unilateral primary osteoarthritis, right hip: Secondary | ICD-10-CM

## 2022-12-16 DIAGNOSIS — I2119 ST elevation (STEMI) myocardial infarction involving other coronary artery of inferior wall: Secondary | ICD-10-CM

## 2022-12-16 DIAGNOSIS — Z955 Presence of coronary angioplasty implant and graft: Secondary | ICD-10-CM | POA: Diagnosis not present

## 2022-12-16 MED ORDER — ATORVASTATIN CALCIUM 20 MG PO TABS: 20.0000 mg | ORAL_TABLET | ORAL | 1 refills | Status: DC

## 2022-12-16 NOTE — Telephone Encounter (Signed)
He is open to trying it every other day to see if it improves.

## 2022-12-16 NOTE — Progress Notes (Signed)
Daily Session Note  Patient Details  Name: Leon Mitchell MRN: [id number] Date of Birth: January 24, 1956 Referring Provider:   Flowsheet Row CARDIAC REHAB PHASE II ORIENTATION from 11/15/2022 in [place]  Referring Provider Dr. Einar Gip       Encounter Date: 12/16/2022  Check In:  Session Check In - 12/16/22 1100       Check-In   Supervising physician immediately available to respond to emergencies [facility] immediately available    Physician(s) Dr. Dellia Cloud    Location [facility]    Staff Present Leana Roe, BS, Exercise Physiologist;Daphyne Hassell Done, RN, BSN    Virtual Visit No    Medication changes reported     No    Fall or balance concerns reported    Yes    Comments He has had several falls this year. 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 He walks with a rollator.    Tobacco Cessation No Change    Warm-up and Cool-down Performed as group-led instruction    Resistance Training Performed Yes    VAD Patient? No    PAD/SET Patient? No      Pain Assessment   Currently in Pain? Yes    Pain Score 6     Pain Location Hip    Pain Orientation Right    Pain Descriptors / Indicators Constant    Pain Type Chronic pain    Pain Onset More than a month ago    Pain Frequency Constant             Capillary Blood Glucose: No results found for this or any previous visit (from the past 24 hour(s)).    Social History   Tobacco Use  Smoking Status Every Day   Packs/day: 0.50   Years: 40.00   Total pack years: 20.00   Types: Cigarettes   Passive exposure: Never  Smokeless Tobacco Never    Goals Met:  Independence with exercise equipment Exercise tolerated well No report of concerns or symptoms today Strength training completed today  Goals Unmet:  Not Applicable  Comments: check out 1200   Dr. Carlyle Dolly is Medical Director for [place]

## 2022-12-16 NOTE — Telephone Encounter (Signed)
Refill

## 2022-12-19 ENCOUNTER — Encounter (HOSPITAL_COMMUNITY)
Admission: RE | Admit: 2022-12-19 | Discharge: 2022-12-19 | Disposition: A | Discharge status: Home / Self Care | Payer: Medicare Other | Source: Ambulatory Visit | Attending: Cardiology | Admitting: Cardiology

## 2022-12-19 VITALS — Wt 216.9 lb

## 2022-12-19 DIAGNOSIS — Z955 Presence of coronary angioplasty implant and graft: Secondary | ICD-10-CM | POA: Diagnosis not present

## 2022-12-19 DIAGNOSIS — I2119 ST elevation (STEMI) myocardial infarction involving other coronary artery of inferior wall: Secondary | ICD-10-CM

## 2022-12-19 NOTE — Progress Notes (Signed)
Daily Session Note  Patient Details  Name: Leon Mitchell MRN: [id number] Date of Birth: Aug 21, 1956 Referring Provider:   Flowsheet Row CARDIAC REHAB PHASE II ORIENTATION from 11/15/2022 in [place]  Referring Provider Dr. Einar Gip       Encounter Date: 12/19/2022  Check In:  Session Check In - 12/19/22 1100       Check-In   Supervising physician immediately available to respond to emergencies [facility] immediately available    Physician(s) Dr. Dellia Cloud    Location [facility]    Staff Present Leana Roe, BS, Exercise Physiologist;Daphyne Hassell Done, RN, BSN    Virtual Visit No    Medication changes reported     No    Fall or balance concerns reported    Yes    Comments He has had several falls this year. 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 He walks with a rollator.    Tobacco Cessation No Change    Warm-up and Cool-down Performed as group-led instruction    Resistance Training Performed Yes    VAD Patient? No    PAD/SET Patient? No      Pain Assessment   Currently in Pain? Yes    Pain Score 6     Pain Location Hip    Pain Orientation Right    Pain Descriptors / Indicators Constant    Pain Type Chronic pain    Pain Onset More than a month ago    Pain Frequency Constant    Multiple Pain Sites No             Capillary Blood Glucose: No results found for this or any previous visit (from the past 24 hour(s)).    Social History   Tobacco Use  Smoking Status Every Day   Packs/day: 0.50   Years: 40.00   Total pack years: 20.00   Types: Cigarettes   Passive exposure: Never  Smokeless Tobacco Never    Goals Met:  Independence with exercise equipment Exercise tolerated well No report of concerns or symptoms today Strength training completed today  Goals Unmet:  Not Applicable  Comments: check out 1200   Dr. Carlyle Dolly is Medical  Director for [facility]

## 2022-12-21 ENCOUNTER — Encounter (HOSPITAL_COMMUNITY)
Admission: RE | Admit: 2022-12-21 | Discharge: 2022-12-21 | Disposition: A | Discharge status: Home / Self Care | Payer: Medicare Other | Source: Ambulatory Visit | Attending: Cardiology | Admitting: Cardiology

## 2022-12-21 DIAGNOSIS — Z955 Presence of coronary angioplasty implant and graft: Secondary | ICD-10-CM

## 2022-12-21 DIAGNOSIS — I2119 ST elevation (STEMI) myocardial infarction involving other coronary artery of inferior wall: Secondary | ICD-10-CM

## 2022-12-21 NOTE — Progress Notes (Signed)
Daily Session Note  Patient Details  Name: Leon Mitchell MRN: [id number] Date of Birth: November 08, 1955 Referring Provider:   Flowsheet Row CARDIAC REHAB PHASE II ORIENTATION from 11/15/2022 in [place]  Referring Provider Dr. Einar Gip       Encounter Date: 12/21/2022  Check In:  Session Check In - 12/21/22 1100       Check-In   Supervising physician immediately available to respond to emergencies [facility] MD immediately available    Physician(s) Dr. Dellia Cloud    Location [facility]    Staff Present Leana Roe, BS, Exercise Physiologist;Debra Wynetta Emery, RN, BSN    Virtual Visit No    Medication changes reported     No    Fall or balance concerns reported    Yes    Comments He has had several falls this year. 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 He walks with a rollator.    Tobacco Cessation No Change    Warm-up and Cool-down Performed as group-led instruction    Resistance Training Performed Yes    VAD Patient? No    PAD/SET Patient? No      Pain Assessment   Currently in Pain? Yes    Pain Score 6     Pain Location Hip    Pain Orientation Right    Pain Descriptors / Indicators Constant    Pain Type Chronic pain    Pain Radiating Towards down the leg stabbing when walking    Pain Onset More than a month ago    Pain Frequency Constant    Aggravating Factors  ambulation    Multiple Pain Sites No             Capillary Blood Glucose: No results found for this or any previous visit (from the past 24 hour(s)).    Social History   Tobacco Use  Smoking Status Every Day   Packs/day: 0.50   Years: 40.00   Total pack years: 20.00   Types: Cigarettes   Passive exposure: Never  Smokeless Tobacco Never    Goals Met:  Independence with exercise equipment Exercise tolerated well No report of concerns or symptoms today Strength training completed  today  Goals Unmet:  Not Applicable  Comments: check out 1200   Dr. Carlyle Dolly is Medical Director for [place]

## 2022-12-23 ENCOUNTER — Encounter (HOSPITAL_COMMUNITY)
Admission: RE | Admit: 2022-12-23 | Discharge: 2022-12-23 | Disposition: A | Discharge status: Home / Self Care | Payer: Medicare Other | Source: Ambulatory Visit | Attending: Cardiology | Admitting: Cardiology

## 2022-12-23 DIAGNOSIS — E7800 Pure hypercholesterolemia, unspecified: Secondary | ICD-10-CM | POA: Diagnosis not present

## 2022-12-23 DIAGNOSIS — I2119 ST elevation (STEMI) myocardial infarction involving other coronary artery of inferior wall: Secondary | ICD-10-CM

## 2022-12-23 DIAGNOSIS — I2510 Atherosclerotic heart disease of native coronary artery without angina pectoris: Secondary | ICD-10-CM | POA: Diagnosis not present

## 2022-12-23 DIAGNOSIS — Z955 Presence of coronary angioplasty implant and graft: Secondary | ICD-10-CM

## 2022-12-23 NOTE — Progress Notes (Signed)
Daily Session Note  Patient Details  Name: Leon Mitchell MRN: [id number] Date of Birth: 04-21-1956 Referring Provider:   Flowsheet Row CARDIAC REHAB PHASE II ORIENTATION from 11/15/2022 in [place]  Referring Provider Dr. Einar Gip       Encounter Date: 12/23/2022  Check In:  Session Check In - 12/23/22 1100       Check-In   Supervising physician immediately available to respond to emergencies [facility] MD immediately available    Physician(s) Dr. Dellia Cloud    Location [facility]    Staff Present Leana Roe, BS, Exercise Physiologist;Debra Wynetta Emery, RN, BSN    Virtual Visit No    Medication changes reported     No    Fall or balance concerns reported    Yes    Comments He has had several falls this year. 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 He walks with a rollator.    Tobacco Cessation No Change    Warm-up and Cool-down Performed as group-led instruction    Resistance Training Performed Yes    VAD Patient? No    PAD/SET Patient? No      Pain Assessment   Currently in Pain? Yes    Pain Score 6     Pain Location Hip    Pain Orientation Right    Pain Descriptors / Indicators Constant    Pain Type Chronic pain    Pain Radiating Towards down the leg    Pain Onset More than a month ago    Pain Frequency Constant    Aggravating Factors  ambulation    Multiple Pain Sites No             Capillary Blood Glucose: No results found for this or any previous visit (from the past 24 hour(s)).    Social History   Tobacco Use  Smoking Status Every Day   Packs/day: 0.50   Years: 40.00   Additional pack years: 0.00   Total pack years: 20.00   Types: Cigarettes   Passive exposure: Never  Smokeless Tobacco Never    Goals Met:  Independence with exercise equipment Exercise tolerated well No report of concerns or symptoms today Strength training completed  today  Goals Unmet:  Not Applicable  Comments: check out 1200   Dr. Carlyle Dolly is Medical Director for [place]

## 2022-12-24 LAB — CMP14+EGFR
ALT: 17 IU/L (ref 0–44)
AST: 16 IU/L (ref 0–40)
Albumin/Globulin Ratio: 1.6 (ref 1.2–2.2)
Albumin: 4 g/dL (ref 3.9–4.9)
Alkaline Phosphatase: 110 IU/L (ref 44–121)
BUN/Creatinine Ratio: 30 — ABNORMAL HIGH (ref 10–24)
BUN: 27 mg/dL (ref 8–27)
Bilirubin Total: 0.6 mg/dL (ref 0.0–1.2)
CO2: 25 mmol/L (ref 20–29)
Calcium: 9.4 mg/dL (ref 8.6–10.2)
Chloride: 101 mmol/L (ref 96–106)
Creatinine, Ser: 0.9 mg/dL (ref 0.76–1.27)
Globulin, Total: 2.5 g/dL (ref 1.5–4.5)
Glucose: 90 mg/dL (ref 70–99)
Potassium: 5 mmol/L (ref 3.5–5.2)
Sodium: 138 mmol/L (ref 134–144)
Total Protein: 6.5 g/dL (ref 6.0–8.5)
eGFR: 93 mL/min/{1.73_m2} (ref >59)

## 2022-12-24 LAB — CBC
Hematocrit: 45.8 % (ref 37.5–51.0)
Hemoglobin: 15.8 g/dL (ref 13.0–17.7)
MCH: 31 pg (ref 26.6–33.0)
MCHC: 34.5 g/dL (ref 31.5–35.7)
MCV: 90 fL (ref 79–97)
Platelets: 319 10*3/uL (ref 150–450)
RBC: 5.1 x10E6/uL (ref 4.14–5.80)
RDW: 12.9 % (ref 11.6–15.4)
WBC: 7.7 10*3/uL (ref 3.4–10.8)

## 2022-12-24 LAB — LIPID PANEL WITH LDL/HDL RATIO
Cholesterol, Total: 144 mg/dL (ref 100–199)
HDL: 38 mg/dL — ABNORMAL LOW (ref >39)
LDL Chol Calc (NIH): 91 mg/dL (ref 0–99)
LDL/HDL Ratio: 2.4 ratio (ref 0.0–3.6)
Triglycerides: 77 mg/dL (ref 0–149)
VLDL Cholesterol Cal: 15 mg/dL (ref 5–40)

## 2022-12-26 ENCOUNTER — Encounter (HOSPITAL_COMMUNITY): Payer: Medicare Other

## 2022-12-26 ENCOUNTER — Ambulatory Visit: Payer: Medicare Other | Admitting: Cardiology

## 2022-12-26 ENCOUNTER — Encounter: Payer: Self-pay | Admitting: Cardiology

## 2022-12-26 VITALS — BP 130/60 | HR 55 | Resp 16 | Ht 71.0 in | Wt 220.6 lb

## 2022-12-26 DIAGNOSIS — E7800 Pure hypercholesterolemia, unspecified: Secondary | ICD-10-CM

## 2022-12-26 DIAGNOSIS — M25562 Pain in left knee: Secondary | ICD-10-CM | POA: Diagnosis not present

## 2022-12-26 DIAGNOSIS — M1611 Unilateral primary osteoarthritis, right hip: Secondary | ICD-10-CM

## 2022-12-26 DIAGNOSIS — I2510 Atherosclerotic heart disease of native coronary artery without angina pectoris: Secondary | ICD-10-CM

## 2022-12-26 DIAGNOSIS — I10 Essential (primary) hypertension: Secondary | ICD-10-CM

## 2022-12-26 DIAGNOSIS — Z716 Tobacco abuse counseling: Secondary | ICD-10-CM

## 2022-12-26 NOTE — Progress Notes (Signed)
Primary Physician/Referring:  Glenda Chroman, MD  Patient ID: Leon Mitchell, male    DOB: 1956/07/12, 67 y.o.   MRN: [id number]  No chief complaint on file.  HPI:    Leon Mitchell  is a 67 y.o. 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   She had called her stating that he was unable to tolerate 20 mg of Lipitor a daily basis, hence has reduced to every other day with improvement in arthralgias and myalgias.  He is still using a walker to help support due to hip pain and bilateral knee pain.  He has not had any recurrence of chest pain, denies dyspnea, no dizziness or syncope.  He is not having any dyspnea as he has reduced his physical activity.  Past Medical History:  Diagnosis Date   Bulging lumbar disc    GERD (gastroesophageal reflux disease)    History of kidney stones    Hypertension    Pneumonia    May 2023   Social History   Tobacco Use   Smoking status: Every Day    Packs/day: 0.50    Years: 40.00    Additional pack years: 0.00    Total pack years: 20.00    Types: Cigarettes    Passive exposure: Never   Smokeless tobacco: Never  Substance Use Topics   Alcohol use: Yes    Comment: very little; occasionally   Marital Status: Widowed  ROS  Review of Systems  Cardiovascular:  Negative for chest pain, dyspnea on exertion and leg swelling.  Musculoskeletal:  Positive for joint pain.   Objective      12/26/2022    3:42 PM 12/19/2022   11:00 AM 12/05/2022   11:00 AM  Vitals with BMI  Height 5\' 11"     Weight 220 lbs 10 oz 216 lbs 15 oz 216 lbs 1 oz  BMI [id number]  [id number]  Systolic [id number]    Diastolic 60    Pulse 55     SpO2: 97 %  Physical Exam Neck:      Vascular: No carotid bruit or JVD.  Cardiovascular:     Rate and Rhythm: Normal rate and regular rhythm.     Pulses: Intact distal pulses.     Heart sounds: Normal heart sounds. No murmur heard.    No gallop.  Pulmonary:     Effort: Pulmonary effort is normal.     Breath sounds: Normal breath sounds.  Abdominal:     General: Bowel sounds are normal.     Palpations: Abdomen is soft.  Musculoskeletal:     Right lower leg: No edema.     Left lower leg: No edema.    Medications and allergies   Allergies  Allergen Reactions   Rosuvastatin Other (See Comments)    Severe myalgia     Medication list   Current Outpatient Medications:    diclofenac (VOLTAREN) 75 MG EC tablet, Take 75 mg by mouth 2 (two) times daily as needed for moderate pain., Disp: , Rfl:    nicotine (EQL NICOTINE) 21 mg/24hr patch, Place 1 patch (21 mg total) onto the skin daily., Disp: 28 patch, Rfl: 0   aspirin EC 81 MG tablet, Take 81 mg by mouth in the morning. Swallow whole.,  Disp: , Rfl:    atorvastatin (LIPITOR) 20 MG tablet, Take 1 tablet (20 mg total) by mouth every other day., Disp: 90 tablet, Rfl: 1   cyclobenzaprine (FLEXERIL) 10 MG tablet, Take 1 tablet (10 mg total) by mouth 3 (three) times daily as needed for muscle spasms., Disp: 30 tablet, Rfl: 0   ezetimibe (ZETIA) 10 MG tablet, Take 1 tablet (10 mg total) by mouth daily., Disp: 90 tablet, Rfl: 1   gabapentin (NEURONTIN) 300 MG capsule, Take 600 mg by mouth 3 (three) times daily., Disp: , Rfl:    HYDROcodone-acetaminophen (NORCO/VICODIN) 5-325 MG tablet, Take 1 tablet by mouth 3 (three) times daily as needed for moderate pain., Disp: , Rfl:    lisinopril (ZESTRIL) 20 MG tablet, Take 1 tablet (20 mg total) by mouth daily., Disp: 90 tablet, Rfl: 3   metoprolol succinate (TOPROL-XL) 25 MG 24 hr tablet, Take 1 tablet (25 mg total) by mouth daily., Disp: 90 tablet, Rfl: 1   nitroGLYCERIN (NITROSTAT) 0.4 MG SL tablet, Place 1 tablet (0.4 mg total)  under the tongue every 5 (five) minutes as needed for up to 25 days for chest pain., Disp: 25 tablet, Rfl: 3   prasugrel (EFFIENT) 10 MG TABS tablet, Take 1 tablet (10 mg total) by mouth daily., Disp: 90 tablet, Rfl: 3   tamsulosin (FLOMAX) 0.4 MG CAPS capsule, Take 0.4 mg by mouth daily., Disp: , Rfl:    traMADol (ULTRAM) 50 MG tablet, TAKE 1 TABLET BY MOUTH TWICE DAILY AS NEEDED, Disp: 60 tablet, Rfl: 1   Vitamin D, Ergocalciferol, (DRISDOL) 1.25 MG (50000 UNIT) CAPS capsule, Take 50,000 Units by mouth every Sunday., Disp: , Rfl:  Laboratory examination:   Recent Labs    06/20/22 0755 10/18/22 2323 10/18/22 2328 10/19/22 0233 12/23/22 1205  NA 136 138 142 137 138  K 3.6 3.5 3.6 3.8 5.0  CL 104 106 105 106 101  CO2 25 24  --  21* 25  GLUCOSE 91 139* 141* 123* 90  BUN 22 30* 32* 27* 27  CREATININE 0.82 1.14 1.10 1.11 0.90  CALCIUM 8.9 8.7*  --  9.0 9.4  GFRNONAA >60 >60  --  >60  --     Lab Results  Component Value Date   GLUCOSE 90 12/23/2022   NA 138 12/23/2022   K 5.0 12/23/2022   CL 101 12/23/2022   CO2 25 12/23/2022   BUN 27 12/23/2022   CREATININE 0.90 12/23/2022   GFRNONAA >60 10/19/2022   CALCIUM 9.4 12/23/2022   PROT 6.5 12/23/2022   ALBUMIN 4.0 12/23/2022   LABGLOB 2.5 12/23/2022   AGRATIO 1.6 12/23/2022   BILITOT 0.6 12/23/2022   ALKPHOS 110 12/23/2022   AST 16 12/23/2022   ALT 17 12/23/2022   ANIONGAP 10 10/19/2022        Latest Ref Rng & Units 12/23/2022   12:05 PM 10/18/2022   11:23 PM 04/07/2022    8:27 AM  Hepatic Function  Total Protein 6.0 - 8.5 g/dL 6.5  6.3  6.7   Albumin 3.9 - 4.9 g/dL 4.0  3.6  3.5   AST 0 - 40 IU/L 16  24  19    ALT 0 - 44 IU/L 17  17  15    Alk Phosphatase 44 - 121 IU/L 110  96  88   Total Bilirubin 0.0 - 1.2 mg/dL 0.6  0.5  0.5    Lipid Panel Recent Labs    04/20/22 0321 10/18/22 2323 10/19/22 0233 12/23/22 1205  CHOL 192 211* 203* 144  TRIG 104 84 75 77  LDLCALC 137* 159* 147* 91  VLDL 21 17 15   --   HDL 34*  35* 41 38*  CHOLHDL 5.6 6.0 5.0  --    Lipoprotein (a) 04/21/23 <75.0 nmol/L 40.4 High    HEMOGLOBIN A1C Lab Results  Component Value Date   HGBA1C 5.1 10/18/2022   MPG 99.67 10/18/2022   TSH Recent Labs    10/19/22 0233  TSH 2.328   Radiology:   Cardiac Studies:   Echocardiogram 10/19/2022:   1. Left ventricular ejection fraction, by estimation, is 60 to 65%. The left ventricle has normal function. The left ventricle has no regional wall motion abnormalities. Left ventricular diastolic parameters were normal.  2. Right ventricular systolic function is normal. The right ventricular size is normal.  3. The mitral valve is normal in structure. No evidence of mitral valve regurgitation. No evidence of mitral stenosis.  4. The aortic valve is normal in structure. Aortic valve regurgitation is mild. No aortic stenosis is present.  5. The inferior vena cava is normal in size with greater than 50% respiratory variability, suggesting right atrial pressure of 3 mmHg.   Left Heart Catheterization 10/19/22:  LV: LV normal, LVEF 45% with inferior akinesis.  No significant regurgitation. LM: Large-caliber vessel.  Smooth and normal. LAD: Gives origin to large D1 and D2.  Mild disease is noted in the midsegment of the LAD. Cx: Small, mid segment has a 30% mild disease, resulting to large OM1 and OM 2. RCA: Large-caliber vessel and a dominant vessel.  Has moderate tortuosity in the proximal segment at the crux.  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  There is distal embolization in the PL and PDA branches.   Intervention data: Successful aspiration thrombectomy using priority 1 thrombectomy catheter followed by direct stenting with 4.0 x 16 mm Synergy XD at 20 atmospheric pressure at the proximal circumflex at the proximal, 2 to the proximal edge dissection, covered with a 4.5 by 12 mm  Synergy XD deployed at 13 atm pressure for 30 seconds then postdilated with the same stent balloon at  16 pressure pressure between the stent starts.   Recommendation: Patient will need dual antiplatelet therapy with aspirin and Brilinta for a period of 1 year.  I will start him on cangrelor infusion until infusion is done, patient did receive 180 mg of Brilinta in the Cath Lab.  150 mcg utilized.    EKG:   EKG 10/27/2022: Marked sinus bradycardia at the rate of 49 bpm.  Normal axis, no evidence of ischemia.  Normal EKG.  EKG 10/19/2022: Normal sinus rhythm without evidence of ischemia or infarct. Normal EKG. Compared to 10/18/2022, inferior and posterior ST segment changes suggestive of acute injury pattern no longer present.   Assessment     ICD-10-CM   1. Coronary artery disease involving native coronary artery of native heart without angina pectoris  I25.10     2. Pure hypercholesterolemia  E78.00     3. Primary hypertension  I10     4. Primary osteoarthritis of right hip  M16.11     5. Tobacco abuse counseling  Z71.6 nicotine (EQL NICOTINE) 21 mg/24hr patch       No orders of the defined types were placed in this encounter.   No orders of the defined types were placed in this encounter.   There are no discontinued medications.    Recommendations:   Leon Mitchell  is a 67 y.o.  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   1. Coronary artery disease involving native coronary artery of native heart without angina pectoris Patient remains angina free.  He has started to move around with his walker without any chest pain or dyspnea.  2. Pure hypercholesterolemia He has not been able to tolerate Lipitor at full dose, presently on 20 mg every other day.  He tried  Crestor in the past and could not tolerate this due to severe myalgia.  He is also on Zetia 10 mg daily.  He will need follow-up lipids at some point, suspect lipids will not be at goal.  3. Primary hypertension Blood pressure is under excellent control.  Continue present medical therapy.  4. Primary osteoarthritis of right hip Patient is scheduled for right total hip arthroplasty on 04/03/2023 by Dr. Gaynelle Arabian, needs surgical clearance I would like to see him back prior to his surgical date for risk stratification.  It will be 6 months since his angioplasty, of the profunda to be a year but he is in so much pain from arthritis that we will proceed with surgery.  Advised him that he could certainly use diclofenac sodium that he has been using on a as needed basis 1 tablet a day or 1 tablet every other day for pain relief.  He does not want to use any narcotics.  5. Tobacco abuse counseling Again discussed regarding tobacco use, complete cessation discussed, he does have nicotine patches at home and is aware of the risks of smoking however states that he has to make up his mind and he knows that it is the right thing to do and he will try again to quit smoking, presently smoking 1 pack of cigarettes a day.  I will see him back in 3 months for follow-up prior to his surgery, he will need lipid profile testing as he has reduced the dose of Crestor, suspect his LDL will be elevated further, could consider placing him on PCSK9 inhibitors.   Adrian Prows, MD, [facility] 12/26/2022, 4:10 PM Office: [phone]

## 2022-12-28 ENCOUNTER — Encounter (HOSPITAL_COMMUNITY)
Admission: RE | Admit: 2022-12-28 | Discharge: 2022-12-28 | Disposition: A | Discharge status: Home / Self Care | Payer: Medicare Other | Source: Ambulatory Visit | Attending: Cardiology | Admitting: Cardiology

## 2022-12-28 DIAGNOSIS — Z955 Presence of coronary angioplasty implant and graft: Secondary | ICD-10-CM | POA: Diagnosis not present

## 2022-12-28 DIAGNOSIS — I2119 ST elevation (STEMI) myocardial infarction involving other coronary artery of inferior wall: Secondary | ICD-10-CM

## 2022-12-28 NOTE — Progress Notes (Signed)
Daily Session Note  Patient Details  Name: Leon Mitchell MRN: [id number] Date of Birth: [birth year] Referring Provider:   Flowsheet Row CARDIAC REHAB PHASE II ORIENTATION from 11/15/2022 in [place]  Referring Provider Dr. Einar Gip       Encounter Date: 12/28/2022  Check In:  Session Check In - 12/28/22 1100       Check-In   Supervising physician immediately available to respond to emergencies [facility] MD immediately available    Physician(s) Dr. Gasper Sells    Location [facility]    Staff Present Melven Sartorius BSN, RN;Annaleia Pence Wynetta Emery, RN, BSN;Heather Mel Almond, BS, Exercise Physiologist    Virtual Visit No    Medication changes reported     Yes    Comments Cardiologist said patient could restart diclofenac sodium 1 day for chronic OA pain.    Fall or balance concerns reported    Yes    Comments He has had several falls this year. 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 He walks with a rollator.    Tobacco Cessation No Change    Current number of cigarettes/nicotine per day     20    Warm-up and Cool-down Performed as group-led instruction    Resistance Training Performed Yes    VAD Patient? No    PAD/SET Patient? No      Pain Assessment   Currently in Pain? Yes    Pain Score 7     Pain Location Hip    Pain Orientation Right    Pain Descriptors / Indicators Constant    Pain Type Chronic pain    Pain Radiating Towards Radiates down his leg.    Pain Onset More than a month ago    Pain Frequency Constant    Aggravating Factors  Ambulation    Pain Relieving Factors Tramadol, Tylenol    Effect of Pain on Daily Activities Mobility    Multiple Pain Sites No             Capillary Blood Glucose: No results found for this or any previous visit (from the past 24 hour(s)).    Social History   Tobacco Use  Smoking Status Every Day   Packs/day: 0.50   Years: 40.00    Additional pack years: 0.00   Total pack years: 20.00   Types: Cigarettes   Passive exposure: Never  Smokeless Tobacco Never    Goals Met:  Independence with exercise equipment Exercise tolerated well No report of concerns or symptoms today Strength training completed today  Goals Unmet:  Not Applicable  Comments: Check out 1200.   Dr. Carlyle Dolly is Medical Director for [facility] Cardiac Rehab

## 2022-12-30 ENCOUNTER — Encounter (HOSPITAL_COMMUNITY)
Admission: RE | Admit: 2022-12-30 | Discharge: 2022-12-30 | Disposition: A | Discharge status: Home / Self Care | Payer: Medicare Other | Source: Ambulatory Visit | Attending: Cardiology | Admitting: Cardiology

## 2022-12-30 DIAGNOSIS — I2119 ST elevation (STEMI) myocardial infarction involving other coronary artery of inferior wall: Secondary | ICD-10-CM

## 2022-12-30 DIAGNOSIS — Z955 Presence of coronary angioplasty implant and graft: Secondary | ICD-10-CM | POA: Diagnosis not present

## 2022-12-30 NOTE — Progress Notes (Signed)
Daily Session Note  Patient Details  Name: Leon Mitchell MRN: [id number] Date of Birth: [birth year] Referring Provider:   Flowsheet Row CARDIAC REHAB PHASE II ORIENTATION from 11/15/2022 in [place]  Referring Provider Dr. Einar Gip       Encounter Date: 12/30/2022  Check In:  Session Check In - 12/30/22 1057       Check-In   Supervising physician immediately available to respond to emergencies [facility] MD immediately available    Physician(s) Dr Harl Bowie    Location [facility]    Staff Present Leana Roe, BS, Exercise Physiologist;Debra Wynetta Emery, RN, Joanette Gula, RN, BSN    Virtual Visit No    Comments Cardiologist said patient could restart diclofenac sodium 1 day for chronic OA pain.    Fall or balance concerns reported    Yes    Comments He has had several falls this year. 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 He walks with a rollator.    Tobacco Cessation No Change    Warm-up and Cool-down Performed as group-led instruction    Resistance Training Performed Yes    VAD Patient? No      Pain Assessment   Currently in Pain? Yes    Pain Score 6     Pain Location Hip    Pain Orientation Right    Pain Descriptors / Indicators Aching    Pain Type Chronic pain    Multiple Pain Sites Yes      3rd Pain Site   Pain Score 6    Pain Location Knee    Pain Orientation Right    Pain Descriptors / Indicators Aching      2nd Pain Site   Pain Type Chronic pain             Capillary Blood Glucose: No results found for this or any previous visit (from the past 24 hour(s)).    Social History   Tobacco Use  Smoking Status Every Day   Packs/day: 0.50   Years: 40.00   Additional pack years: 0.00   Total pack years: 20.00   Types: Cigarettes   Passive exposure: Never  Smokeless Tobacco Never    Goals Met:  Independence with exercise equipment Exercise  tolerated well No report of concerns or symptoms today Strength training completed today  Goals Unmet:  Not Applicable  Comments: Checkout at 1200.   Dr. Carlyle Dolly is Medical Director for [facility] Cardiac Rehab

## 2023-01-02 ENCOUNTER — Encounter (HOSPITAL_COMMUNITY)
Admission: RE | Admit: 2023-01-02 | Discharge: 2023-01-02 | Disposition: A | Discharge status: Home / Self Care | Payer: Medicare Other | Source: Ambulatory Visit | Attending: Cardiology | Admitting: Cardiology

## 2023-01-02 VITALS — Wt 217.6 lb

## 2023-01-02 DIAGNOSIS — Z955 Presence of coronary angioplasty implant and graft: Secondary | ICD-10-CM | POA: Diagnosis not present

## 2023-01-02 DIAGNOSIS — I2119 ST elevation (STEMI) myocardial infarction involving other coronary artery of inferior wall: Secondary | ICD-10-CM | POA: Diagnosis not present

## 2023-01-02 NOTE — Progress Notes (Signed)
Daily Session Note  Patient Details  Name: KEILAND CURRENT MRN: [id number] Date of Birth: 11/27/1955 Referring Provider:   Flowsheet Row CARDIAC REHAB PHASE II ORIENTATION from 11/15/2022 in [place]  Referring Provider Dr. Einar Gip       Encounter Date: 01/02/2023  Check In:  Session Check In - 01/02/23 1100       Check-In   Supervising physician immediately available to respond to emergencies [facility] MD immediately available    Physician(s) Dr Domenic Polite    Location [facility]    Staff Present Leana Roe, BS, Exercise Physiologist;Debra Wynetta Emery, RN, Joanette Gula, RN, BSN    Virtual Visit No    Medication changes reported     Yes    Comments Cardiologist said patient could restart diclofenac sodium 1 day for chronic OA pain.    Fall or balance concerns reported    Yes    Comments He has had several falls this year. 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 He walks with a rollator.    Tobacco Cessation No Change    Warm-up and Cool-down Performed as group-led instruction    Resistance Training Performed Yes      Pain Assessment   Currently in Pain? Yes    Pain Score 7     Pain Location Hip    Pain Orientation Right    Pain Type Chronic pain      3rd Pain Site   Pain Score 7    Pain Location Knee    Pain Orientation Right    Pain Descriptors / Indicators Aching      2nd Pain Site   Pain Type Chronic pain             Capillary Blood Glucose: No results found for this or any previous visit (from the past 24 hour(s)).    Social History   Tobacco Use  Smoking Status Every Day   Packs/day: 0.50   Years: 40.00   Additional pack years: 0.00   Total pack years: 20.00   Types: Cigarettes   Passive exposure: Never  Smokeless Tobacco Never    Goals Met:  Independence with exercise equipment Exercise tolerated well No report of concerns or  symptoms today Strength training completed today  Goals Unmet:  Not Applicable  Comments: Checkout at 1200.   Dr. Carlyle Dolly is Medical Director for [facility] Cardiac Rehab

## 2023-01-04 ENCOUNTER — Encounter (HOSPITAL_COMMUNITY)
Admission: RE | Admit: 2023-01-04 | Discharge: 2023-01-04 | Disposition: A | Discharge status: Home / Self Care | Payer: Medicare Other | Source: Ambulatory Visit | Attending: Cardiology | Admitting: Cardiology

## 2023-01-04 DIAGNOSIS — I2119 ST elevation (STEMI) myocardial infarction involving other coronary artery of inferior wall: Secondary | ICD-10-CM | POA: Diagnosis not present

## 2023-01-04 DIAGNOSIS — Z955 Presence of coronary angioplasty implant and graft: Secondary | ICD-10-CM | POA: Diagnosis not present

## 2023-01-04 NOTE — Progress Notes (Signed)
Daily Session Note  Patient Details  Name: Leon Mitchell MRN: [id number] Date of Birth: 1956/05/10 Referring Provider:   Flowsheet Row CARDIAC REHAB PHASE II ORIENTATION from 11/15/2022 in [place]  Referring Provider Dr. Einar Gip       Encounter Date: 01/04/2023  Check In:  Session Check In - 01/04/23 1100       Check-In   Supervising physician immediately available to respond to emergencies [facility] MD immediately available    Physician(s) Dr Domenic Polite    Location [facility]    Staff Present Leana Roe, BS, Exercise Physiologist;Hillary Troutman BSN, RN    Virtual Visit No    Medication changes reported     No    Comments He has had several falls this year. 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 He walks with a rollator.    Tobacco Cessation No Change    Warm-up and Cool-down Performed as group-led instruction    Resistance Training Performed Yes    VAD Patient? No    PAD/SET Patient? No      Pain Assessment   Currently in Pain? Yes    Pain Score 5     Pain Location Hip    Pain Orientation Right    Pain Descriptors / Indicators Aching    Pain Type Chronic pain    Pain Radiating Towards Raduated down leg    Pain Onset More than a month ago    Pain Frequency Constant    Aggravating Factors  Ambulation    Effect of Pain on Daily Activities Mobility    Multiple Pain Sites No             Capillary Blood Glucose: No results found for this or any previous visit (from the past 24 hour(s)).    Social History   Tobacco Use  Smoking Status Every Day   Packs/day: 0.50   Years: 40.00   Additional pack years: 0.00   Total pack years: 20.00   Types: Cigarettes   Passive exposure: Never  Smokeless Tobacco Never    Goals Met:  Independence with exercise equipment Exercise tolerated well No report of concerns or symptoms today Strength training  completed today  Goals Unmet:  Not Applicable  Comments: Check out 12003   Dr. Carlyle Dolly is Medical Director for [place]

## 2023-01-04 NOTE — Progress Notes (Signed)
Cardiac Individual Treatment Plan  Patient Details  Name: Leon Mitchell MRN: [id number] Date of Birth: [birth year] Referring Provider:   Flowsheet Row CARDIAC REHAB PHASE II ORIENTATION from 11/15/2022 in [facility]  Referring Provider Dr. Einar Gip       Initial Encounter Date:  Flowsheet Row CARDIAC REHAB PHASE II ORIENTATION from 11/15/2022 in [facility]  Date 11/15/22       Visit Diagnosis: Status post coronary artery stent placement  Patient's Home Medications on Admission:  Current Outpatient Medications:    aspirin EC 81 MG tablet, Take 81 mg by mouth in the morning. Swallow whole., Disp: , Rfl:    atorvastatin (LIPITOR) 20 MG tablet, Take 1 tablet (20 mg total) by mouth every other day., Disp: 90 tablet, Rfl: 1   cyclobenzaprine (FLEXERIL) 10 MG tablet, Take 1 tablet (10 mg total) by mouth 3 (three) times daily as needed for muscle spasms., Disp: 30 tablet, Rfl: 0   diclofenac (VOLTAREN) 75 MG EC tablet, Take 75 mg by mouth 2 (two) times daily as needed for moderate pain., Disp: , Rfl:    ezetimibe (ZETIA) 10 MG tablet, Take 1 tablet (10 mg total) by mouth daily., Disp: 90 tablet, Rfl: 1   gabapentin (NEURONTIN) 300 MG capsule, Take 600 mg by mouth 3 (three) times daily., Disp: , Rfl:    HYDROcodone-acetaminophen (NORCO/VICODIN) 5-325 MG tablet, Take 1 tablet by mouth 3 (three) times daily as needed for moderate pain., Disp: , Rfl:    lisinopril (ZESTRIL) 20 MG tablet, Take 1 tablet (20 mg total) by mouth daily., Disp: 90 tablet, Rfl: 3   metoprolol succinate (TOPROL-XL) 25 MG 24 hr tablet, Take 1 tablet (25 mg total) by mouth daily., Disp: 90 tablet, Rfl: 1   nicotine (EQL NICOTINE) 21 mg/24hr patch, Place 1 patch (21 mg total) onto the skin daily., Disp: 28 patch, Rfl: 0   nitroGLYCERIN (NITROSTAT) 0.4 MG SL tablet, Place 1 tablet (0.4 mg total) under the tongue every 5 (five) minutes as needed for up to 25 days for chest pain., Disp: 25  tablet, Rfl: 3   prasugrel (EFFIENT) 10 MG TABS tablet, Take 1 tablet (10 mg total) by mouth daily., Disp: 90 tablet, Rfl: 3   tamsulosin (FLOMAX) 0.4 MG CAPS capsule, Take 0.4 mg by mouth daily., Disp: , Rfl:    traMADol (ULTRAM) 50 MG tablet, TAKE 1 TABLET BY MOUTH TWICE DAILY AS NEEDED, Disp: 60 tablet, Rfl: 1   Vitamin D, Ergocalciferol, (DRISDOL) 1.25 MG (50000 UNIT) CAPS capsule, Take 50,000 Units by mouth every Sunday., Disp: , Rfl:   Past Medical History: Past Medical History:  Diagnosis Date   Bulging lumbar disc    GERD (gastroesophageal reflux disease)    History of kidney stones    Hypertension    Pneumonia    May 2023    Tobacco Use: Social History   Tobacco Use  Smoking Status Every Day   Packs/day: 0.50   Years: 40.00   Additional pack years: 0.00   Total pack years: 20.00   Types: Cigarettes   Passive exposure: Never  Smokeless Tobacco Never    Labs: Review Flowsheet       Latest Ref Rng & Units 04/20/2022 10/18/2022 10/19/2022 12/23/2022  Labs for ITP Cardiac and Pulmonary Rehab  Cholestrol 100 - 199 mg/dL 192  211  203  144   LDL (calc) 0 - 99 mg/dL 137  159  147  91   HDL-C >39 mg/dL  34  35  41  38   Trlycerides 0 - 149 mg/dL 104  84  75  77   Hemoglobin A1c 4.8 - 5.6 % - 5.1  - -  TCO2 22 - 32 mmol/L - 24  - -    Capillary Blood Glucose: No results found for: "GLUCAP"   Exercise Target Goals: Exercise Program Goal: Individual exercise prescription set using results from initial 6 min walk test and THRR while considering  patient's activity barriers and safety.   Exercise Prescription Goal: Starting with aerobic activity 30 plus minutes a day, 3 days per week for initial exercise prescription. Provide home exercise prescription and guidelines that participant acknowledges understanding prior to discharge.  Activity Barriers & Risk Stratification:  Activity Barriers & Cardiac Risk Stratification - 11/15/22 1327       Activity Barriers & Cardiac  Risk Stratification   Activity Barriers Arthritis;Back Problems;Neck/Spine Problems;Joint Problems;Deconditioning;Muscular Weakness;Balance Concerns;History of Falls;Assistive Device    Cardiac Risk Stratification High             6 Minute Walk:  6 Minute Walk     Row Name 11/15/22 1445         6 Minute Walk   Phase Initial     Distance 650 feet     Walk Time 6 minutes     # of Rest Breaks 0     MPH 1.23     METS 1.35     RPE 12     VO2 Peak 4.74     Symptoms Yes (comment)     Comments Arms fatigued due to holding himself up     Resting HR 50 bpm     Resting BP 124/60     Resting Oxygen Saturation  97 %     Exercise Oxygen Saturation  during 6 min walk 99 %     Max Ex. HR 60 bpm     Max Ex. BP 128/64     2 Minute Post BP 118/60              Oxygen Initial Assessment:   Oxygen Re-Evaluation:   Oxygen Discharge (Final Oxygen Re-Evaluation):   Initial Exercise Prescription:  Initial Exercise Prescription - 11/15/22 1400       Date of Initial Exercise RX and Referring Provider   Date 11/15/22    Referring Provider Dr. Einar Gip    Expected Discharge Date 02/10/23      NuStep   Level 1    SPM 60    Minutes 22      Arm Ergometer   Level 1    RPM 45    Minutes 17      Prescription Details   Frequency (times per week) 3    Duration Progress to 30 minutes of continuous aerobic without signs/symptoms of physical distress      Intensity   THRR 40-80% of Max Heartrate 61-122    Ratings of Perceived Exertion 11-13      Resistance Training   Training Prescription Yes    Weight 4    Reps 10-15             Perform Capillary Blood Glucose checks as needed.  Exercise Prescription Changes:   Exercise Prescription Changes     Row Name 11/21/22 1200 12/05/22 1400 12/19/22 1200 01/02/23 1400       Response to Exercise   Blood Pressure (Admit) 114/60 114/68 120/68 110/60    Blood Pressure (Exercise) 120/60 108/66 138/64  112/68    Blood  Pressure (Exit) 110/56 114/74 120/60 128/72    Heart Rate (Admit) 55 bpm 84 bpm 53 bpm 55 bpm    Heart Rate (Exercise) 57 bpm 106 bpm 71 bpm 75 bpm    Heart Rate (Exit) 51 bpm 82 bpm 62 bpm 62 bpm    Rating of Perceived Exertion (Exercise) 11 11 12 12     Duration Continue with 30 min of aerobic exercise without signs/symptoms of physical distress. Continue with 30 min of aerobic exercise without signs/symptoms of physical distress. Continue with 30 min of aerobic exercise without signs/symptoms of physical distress. Continue with 30 min of aerobic exercise without signs/symptoms of physical distress.    Intensity THRR unchanged THRR unchanged THRR unchanged THRR unchanged      Progression   Progression Continue to progress workloads to maintain intensity without signs/symptoms of physical distress. Continue to progress workloads to maintain intensity without signs/symptoms of physical distress. Continue to progress workloads to maintain intensity without signs/symptoms of physical distress. Continue to progress workloads to maintain intensity without signs/symptoms of physical distress.      Resistance Training   Training Prescription -- Yes Yes Yes    Weight -- 4 4 4     Reps -- 10-15 10-15 10-15    Time -- 10 Minutes 10 Minutes 10 Minutes      NuStep   Level 1 3 4 3     SPM 50 76 79 79    Minutes 17 17 17 17     METs 1.81 1.87 1.99 1.93      Arm Ergometer   Level 1 3 3.6 3.8    RPM 52 50 48 49    Minutes 22 22 22 22     METs 1.64 2.07 2.28 2.49             Exercise Comments:   Exercise Goals and Review:   Exercise Goals     Row Name 11/15/22 1447 12/05/22 1416 01/02/23 1453         Exercise Goals   Increase Physical Activity Yes Yes Yes     Intervention Provide advice, education, support and counseling about physical activity/exercise needs.;Develop an individualized exercise prescription for aerobic and resistive training based on initial evaluation findings, risk  stratification, comorbidities and participant's personal goals. Provide advice, education, support and counseling about physical activity/exercise needs.;Develop an individualized exercise prescription for aerobic and resistive training based on initial evaluation findings, risk stratification, comorbidities and participant's personal goals. Provide advice, education, support and counseling about physical activity/exercise needs.;Develop an individualized exercise prescription for aerobic and resistive training based on initial evaluation findings, risk stratification, comorbidities and participant's personal goals.     Expected Outcomes Short Term: Attend rehab on a regular basis to increase amount of physical activity.;Long Term: Add in home exercise to make exercise part of routine and to increase amount of physical activity.;Long Term: Exercising regularly at least 3-5 days a week. Short Term: Attend rehab on a regular basis to increase amount of physical activity.;Long Term: Add in home exercise to make exercise part of routine and to increase amount of physical activity.;Long Term: Exercising regularly at least 3-5 days a week. Short Term: Attend rehab on a regular basis to increase amount of physical activity.;Long Term: Add in home exercise to make exercise part of routine and to increase amount of physical activity.;Long Term: Exercising regularly at least 3-5 days a week.     Increase Strength and Stamina Yes Yes Yes  Intervention Provide advice, education, support and counseling about physical activity/exercise needs.;Develop an individualized exercise prescription for aerobic and resistive training based on initial evaluation findings, risk stratification, comorbidities and participant's personal goals. Provide advice, education, support and counseling about physical activity/exercise needs.;Develop an individualized exercise prescription for aerobic and resistive training based on initial  evaluation findings, risk stratification, comorbidities and participant's personal goals. Provide advice, education, support and counseling about physical activity/exercise needs.;Develop an individualized exercise prescription for aerobic and resistive training based on initial evaluation findings, risk stratification, comorbidities and participant's personal goals.     Expected Outcomes Short Term: Increase workloads from initial exercise prescription for resistance, speed, and METs.;Short Term: Perform resistance training exercises routinely during rehab and add in resistance training at home;Long Term: Improve cardiorespiratory fitness, muscular endurance and strength as measured by increased METs and functional capacity (6MWT) Short Term: Increase workloads from initial exercise prescription for resistance, speed, and METs.;Short Term: Perform resistance training exercises routinely during rehab and add in resistance training at home;Long Term: Improve cardiorespiratory fitness, muscular endurance and strength as measured by increased METs and functional capacity (6MWT) Short Term: Increase workloads from initial exercise prescription for resistance, speed, and METs.;Short Term: Perform resistance training exercises routinely during rehab and add in resistance training at home;Long Term: Improve cardiorespiratory fitness, muscular endurance and strength as measured by increased METs and functional capacity (6MWT)     Able to understand and use rate of perceived exertion (RPE) scale Yes Yes Yes     Intervention Provide education and explanation on how to use RPE scale Provide education and explanation on how to use RPE scale Provide education and explanation on how to use RPE scale     Expected Outcomes Short Term: Able to use RPE daily in rehab to express subjective intensity level;Long Term:  Able to use RPE to guide intensity level when exercising independently Short Term: Able to use RPE daily in rehab  to express subjective intensity level;Long Term:  Able to use RPE to guide intensity level when exercising independently Short Term: Able to use RPE daily in rehab to express subjective intensity level;Long Term:  Able to use RPE to guide intensity level when exercising independently     Knowledge and understanding of Target Heart Rate Range (THRR) Yes Yes Yes     Intervention Provide education and explanation of THRR including how the numbers were predicted and where they are located for reference Provide education and explanation of THRR including how the numbers were predicted and where they are located for reference Provide education and explanation of THRR including how the numbers were predicted and where they are located for reference     Expected Outcomes Short Term: Able to state/look up THRR;Long Term: Able to use THRR to govern intensity when exercising independently;Short Term: Able to use daily as guideline for intensity in rehab Short Term: Able to state/look up THRR;Long Term: Able to use THRR to govern intensity when exercising independently;Short Term: Able to use daily as guideline for intensity in rehab Short Term: Able to state/look up THRR;Long Term: Able to use THRR to govern intensity when exercising independently;Short Term: Able to use daily as guideline for intensity in rehab     Able to check pulse independently Yes -- Yes     Intervention Provide education and demonstration on how to check pulse in carotid and radial arteries.;Review the importance of being able to check your own pulse for safety during independent exercise Provide education and demonstration on how  to check pulse in carotid and radial arteries.;Review the importance of being able to check your own pulse for safety during independent exercise Provide education and demonstration on how to check pulse in carotid and radial arteries.;Review the importance of being able to check your own pulse for safety during  independent exercise     Expected Outcomes Short Term: Able to explain why pulse checking is important during independent exercise;Long Term: Able to check pulse independently and accurately Short Term: Able to explain why pulse checking is important during independent exercise;Long Term: Able to check pulse independently and accurately Short Term: Able to explain why pulse checking is important during independent exercise;Long Term: Able to check pulse independently and accurately     Understanding of Exercise Prescription Yes Yes Yes     Intervention Provide education, explanation, and written materials on patient's individual exercise prescription Provide education, explanation, and written materials on patient's individual exercise prescription Provide education, explanation, and written materials on patient's individual exercise prescription     Expected Outcomes Short Term: Able to explain program exercise prescription;Long Term: Able to explain home exercise prescription to exercise independently Short Term: Able to explain program exercise prescription;Long Term: Able to explain home exercise prescription to exercise independently Short Term: Able to explain program exercise prescription;Long Term: Able to explain home exercise prescription to exercise independently              Exercise Goals Re-Evaluation :  Exercise Goals Re-Evaluation     [place] Name 12/05/22 1417 01/02/23 1454           Exercise Goal Re-Evaluation   Exercise Goals Review Increase Physical Activity;Increase Strength and Stamina;Able to understand and use rate of perceived exertion (RPE) scale;Knowledge and understanding of Target Heart Rate Range (THRR);Able to check pulse independently;Understanding of Exercise Prescription Increase Physical Activity;Increase Strength and Stamina;Able to understand and use rate of perceived exertion (RPE) scale;Knowledge and understanding of Target Heart Rate Range (THRR);Able to check  pulse independently;Understanding of Exercise Prescription      Comments Pt has completed 8 sessions of cardiac rehab. He is deconditioned due to hip and knee pain and but is starting to progress. He uses his rollator during warm uop for stability. He is curretnly exercising at 2.07 METs on the arm ergometer. Will continue to montior and progress as able. Pt has completed 19 sessions of cardiac rehab. He continues to have hip and knee pain but is not affecting his progression, he continue to increase his workload on the stepper. He is currently exercising at 2.49 METs on the arm ergometer. Will continue to monitor and progress as able.      Expected Outcomes Through exercise at rehab and home, the patient will meet thier stated goals. Through exercise at rehab and home, the patient will meet thier stated goals.                Discharge Exercise Prescription (Final Exercise Prescription Changes):  Exercise Prescription Changes - 01/02/23 1400       Response to Exercise   Blood Pressure (Admit) 110/60    Blood Pressure (Exercise) 112/68    Blood Pressure (Exit) 128/72    Heart Rate (Admit) 55 bpm    Heart Rate (Exercise) 75 bpm    Heart Rate (Exit) 62 bpm    Rating of Perceived Exertion (Exercise) 12    Duration Continue with 30 min of aerobic exercise without signs/symptoms of physical distress.    Intensity THRR unchanged  Progression   Progression Continue to progress workloads to maintain intensity without signs/symptoms of physical distress.      Resistance Training   Training Prescription Yes    Weight 4    Reps 10-15    Time 10 Minutes      NuStep   Level 3    SPM 79    Minutes 17    METs 1.93      Arm Ergometer   Level 3.8    RPM 49    Minutes 22    METs 2.49             Nutrition:  Target Goals: Understanding of nutrition guidelines, daily intake of sodium 1500mg , cholesterol 200mg , calories 30% from fat and 7% or less from saturated fats, daily to  have 5 or more servings of fruits and vegetables.  Biometrics:  Pre Biometrics - 11/15/22 1448       Pre Biometrics   Height 5\' 11"  (1.803 m)    Weight 98.6 kg    Waist Circumference 41 inches    Hip Circumference 39 inches    Waist to Hip Ratio 1.05 %    BMI (Calculated) 30.33    Triceps Skinfold 8 mm    % Body Fat 26.1 %    Grip Strength 29.4 kg    Flexibility 0 in    Single Leg Stand 0 seconds              Nutrition Therapy Plan and Nutrition Goals:  Nutrition Therapy & Goals - 11/16/22 0653       Personal Nutrition Goals   Comments Patient's diet assessment score was 50. We offer educational sessions on heart healthy nutrition with handouts and assistance with RD referral if patient is interested.      Intervention Plan   Intervention Nutrition handout(s) given to patient.    Expected Outcomes Short Term Goal: Understand basic principles of dietary content, such as calories, fat, sodium, cholesterol and nutrients.             Nutrition Assessments:  Nutrition Assessments - 11/15/22 1337       MEDFICTS Scores   Pre Score 50            MEDIFICTS Score Key: ?70 Need to make dietary changes  40-70 Heart Healthy Diet ? 40 Therapeutic Level Cholesterol Diet   Picture Your Plate Scores: [id number] Unhealthy dietary pattern with much room for improvement. 41-50 Dietary pattern unlikely to meet recommendations for good health and room for improvement. 51-60 More healthful dietary pattern, with some room for improvement.  >60 Healthy dietary pattern, although there may be some specific behaviors that could be improved.    Nutrition Goals Re-Evaluation:   Nutrition Goals Discharge (Final Nutrition Goals Re-Evaluation):   Psychosocial: Target Goals: Acknowledge presence or absence of significant depression and/or stress, maximize coping skills, provide positive support system. Participant is able to verbalize types and ability to use techniques and skills  needed for reducing stress and depression.  Initial Review & Psychosocial Screening:  Initial Psych Review & Screening - 11/15/22 1328       Initial Review   Current issues with Current Sleep Concerns      Family Dynamics   Good Support System? Yes    Comments His girlfriend supports him.      Barriers   Psychosocial barriers to participate in program There are no identifiable barriers or psychosocial needs.      Screening Interventions   Interventions Encouraged  to exercise    Expected Outcomes Long Term goal: The participant improves quality of Life and PHQ9 Scores as seen by post scores and/or verbalization of changes;Short Term goal: Identification and review with participant of any Quality of Life or Depression concerns found by scoring the questionnaire.             Quality of Life Scores:  Quality of Life - 11/15/22 1448       Quality of Life   Select Quality of Life      Quality of Life Scores   Health/Function Pre 15.97 %    Socioeconomic Pre 22.08 %    Psych/Spiritual Pre 19.21 %    Family Pre 28.3 %    GLOBAL Pre 19.64 %            Scores of 19 and below usually indicate a poorer quality of life in these areas.  A difference of  2-3 points is a clinically meaningful difference.  A difference of 2-3 points in the total score of the Quality of Life Index has been associated with significant improvement in overall quality of life, self-image, physical symptoms, and general health in studies assessing change in quality of life.  PHQ-9: Review Flowsheet       11/15/2022  Depression screen PHQ 2/9  Decreased Interest 3  Down, Depressed, Hopeless 1  PHQ - 2 Score 4  Altered sleeping 1  Tired, decreased energy 3  Change in appetite 1  Feeling bad or failure about yourself  0  Trouble concentrating 1  Moving slowly or fidgety/restless 0  Suicidal thoughts 0  PHQ-9 Score 10  Difficult doing work/chores Somewhat difficult   Interpretation of Total Score   Total Score Depression Severity:  1-4 = Minimal depression, 5-9 = Mild depression, 10-14 = Moderate depression, 15-19 = Moderately severe depression, 20-27 = Severe depression   Psychosocial Evaluation and Intervention:  Psychosocial Evaluation - 11/15/22 1423       Psychosocial Evaluation & Interventions   Interventions Relaxation education;Stress management education;Encouraged to exercise with the program and follow exercise prescription    Comments Pt has no barriers to participate in CR. He reports altered sleep, and he has just started to take melatonin for this. He has no other identifiable psychosocial issues. He did score a 10 on his PHQ-9, and he relates this to his health and denies depression. He states he has little interest in doing things and that he feels tired almost every day. This is due to his severe orthopedic issues. 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 All of this has been delayed until at least late summer due to his recent STEMI and stent. The pain that this causes has made him sedentary which has further worsened his energy levels. He continues to smoke 5-10 cigarettes per day, which is down from the 1 pack per day he was smoking before his STEMI. He is wearing a nicotine patch and reports some benefit. He admits that the reason he cannot quit is due to lack of willpower. He knows that he must get his mind ready to quit before he can do so. He did quit some years ago while taking Chantix, but he does not want to take Chantix again. He states that he is already taking enough pills as it is. He also admits that his lack of willpower is why he is still eating an unhealthy diet. He reports making no dietary changes since his STEMI, even though  he knows he should. He hopes  that this program will help give him a push in the right direction. His goals while in the program are to improve his walking abillities and to be able to do more things that he enjoys. He was  unsure if he would be able to do the program due to his orthopedic limitations, but he was able to do the arm ergometer and stepper at orientation. He is eager to start the program.    Expected Outcomes Pt will continue to have no identifiable psychosocial issues.    Continue Psychosocial Services  No Follow up required             Psychosocial Re-Evaluation:  Psychosocial Re-Evaluation     [place] Name 11/28/22 0815 12/26/22 0803           Psychosocial Re-Evaluation   Current issues with Current Sleep Concerns Current Sleep Concerns      Comments Patient is new to the program. He has completed 4 sessions. He continues to have no psychosocial barriers identified. He seems to enjoy the sessions and demonstrates an interest in improving his health. We will continue to monitor his progress. Patient has completed 16 sessions. He continues to have no psychosocial barriers identified. He continues to enjoy the sessions and demonstrates an interest in improving his health. We will continue to monitor his progress.      Expected Outcomes Patient will continue to have no psychosocial barriers identified. Patient will continue to have no psychosocial barriers identified.      Interventions Stress management education;Relaxation education;Encouraged to attend Cardiac Rehabilitation for the exercise Stress management education;Relaxation education;Encouraged to attend Cardiac Rehabilitation for the exercise      Continue Psychosocial Services  No Follow up required No Follow up required               Psychosocial Discharge (Final Psychosocial Re-Evaluation):  Psychosocial Re-Evaluation - 12/26/22 0803       Psychosocial Re-Evaluation   Current issues with Current Sleep Concerns    Comments Patient has completed 16 sessions. He continues to have no psychosocial barriers identified. He continues to enjoy the sessions and demonstrates an interest in improving his health. We will continue to monitor  his progress.    Expected Outcomes Patient will continue to have no psychosocial barriers identified.    Interventions Stress management education;Relaxation education;Encouraged to attend Cardiac Rehabilitation for the exercise    Continue Psychosocial Services  No Follow up required             Vocational Rehabilitation: Provide vocational rehab assistance to qualifying candidates.   Vocational Rehab Evaluation & Intervention:  Vocational Rehab - 11/15/22 1353       Initial Vocational Rehab Evaluation & Intervention   Assessment shows need for Vocational Rehabilitation No      Vocational Rehab Re-Evaulation   Comments retired             Education: Education Goals: Education classes will be provided on a weekly basis, covering required topics. Participant will state understanding/return demonstration of topics presented.  Learning Barriers/Preferences:  Learning Barriers/Preferences - 11/15/22 1341       Learning Barriers/Preferences   Learning Barriers None    Learning Preferences Skilled Demonstration;Written Material             Education Topics: Hypertension, Hypertension Reduction -Define heart disease and high blood pressure. Discus how high blood pressure affects the body and ways to reduce high blood pressure.   Exercise  and Your Heart -Discuss why it is important to exercise, the FITT principles of exercise, normal and abnormal responses to exercise, and how to exercise safely.   Angina -Discuss definition of angina, causes of angina, treatment of angina, and how to decrease risk of having angina.   Cardiac Medications -Review what the following cardiac medications are used for, how they affect the body, and side effects that may occur when taking the medications.  Medications include Aspirin, Beta blockers, calcium channel blockers, ACE Inhibitors, angiotensin receptor blockers, diuretics, digoxin, and antihyperlipidemics.   Congestive Heart  Failure -Discuss the definition of CHF, how to live with CHF, the signs and symptoms of CHF, and how keep track of weight and sodium intake.   Heart Disease and Intimacy -Discus the effect sexual activity has on the heart, how changes occur during intimacy as we age, and safety during sexual activity. Flowsheet Row CARDIAC REHAB PHASE II EXERCISE from 12/28/2022 in [place]  Date 11/23/22  Educator DF  Instruction Review Code 1- Verbalizes Understanding       Smoking Cessation / COPD -Discuss different methods to quit smoking, the health benefits of quitting smoking, and the definition of COPD. Flowsheet Row CARDIAC REHAB PHASE II EXERCISE from 12/28/2022 in [place]  Date 11/30/22  Educator HB  Instruction Review Code 1- Verbalizes Understanding       Nutrition I: Fats -Discuss the types of cholesterol, what cholesterol does to the heart, and how cholesterol levels can be controlled. Flowsheet Row CARDIAC REHAB PHASE II EXERCISE from 12/28/2022 in [place]  Date 12/07/22  Educator DJ  Instruction Review Code 2- Demonstrated Understanding       Nutrition II: Labels -Discuss the different components of food labels and how to read food label [place] from 12/28/2022 in [place]  Date 12/14/22  Educator HB  Instruction Review Code 2- Demonstrated Understanding       Heart Parts/Heart Disease and PAD -Discuss the anatomy of the heart, the pathway of blood circulation through the heart, and these are affected by heart disease. Flowsheet Row CARDIAC REHAB PHASE II EXERCISE from 12/28/2022 in [place]  Date 12/21/22  Educator HB  Instruction Review Code 1- Verbalizes Understanding       Stress I: Signs and Symptoms -Discuss the causes of stress, how stress may lead to anxiety and depression, and ways to limit  stress. Flowsheet Row CARDIAC REHAB PHASE II EXERCISE from 12/28/2022 in [place]  Date 12/28/22  Educator HB  Instruction Review Code 1- Verbalizes Understanding       Stress II: Relaxation -Discuss different types of relaxation techniques to limit stress.   Warning Signs of Stroke / TIA -Discuss definition of a stroke, what the signs and symptoms are of a stroke, and how to identify when someone is having stroke.   Knowledge Questionnaire Score:  Knowledge Questionnaire Score - 11/15/22 1339       Knowledge Questionnaire Score   Pre Score 23/28             Core Components/Risk Factors/Patient Goals at Admission:  Personal Goals and Risk Factors at Admission - 11/15/22 1353       Core Components/Risk Factors/Patient Goals on Admission   Tobacco Cessation Yes    Number of packs per day 1/4-1/2    Intervention Assist the participant in steps to quit. Provide individualized education and counseling about committing  to Tobacco Cessation, relapse prevention, and pharmacological support that can be provided by physician.;Advice worker, assist with locating and accessing local/national Quit Smoking programs, and support quit date choice.    Expected Outcomes Short Term: Will demonstrate readiness to quit, by selecting a quit date.;Short Term: Will quit all tobacco product use, adhering to prevention of relapse plan.;Long Term: Complete abstinence from all tobacco products for at least 12 months from quit date.    Improve shortness of breath with ADL's Yes    Intervention Provide education, individualized exercise plan and daily activity instruction to help decrease symptoms of SOB with activities of daily living.    Expected Outcomes Short Term: Improve cardiorespiratory fitness to achieve a reduction of symptoms when performing ADLs;Long Term: Be able to perform more ADLs without symptoms or delay the onset of symptoms    Hypertension Yes     Intervention Provide education on lifestyle modifcations including regular physical activity/exercise, weight management, moderate sodium restriction and increased consumption of fresh fruit, vegetables, and low fat dairy, alcohol moderation, and smoking cessation.;Monitor prescription use compliance.    Expected Outcomes Short Term: Continued assessment and intervention until BP is < 140/36mm HG in hypertensive participants. < 130/69mm HG in hypertensive participants with diabetes, heart failure or chronic kidney disease.;Long Term: Maintenance of blood pressure at goal levels.    Lipids Yes    Intervention Provide education and support for participant on nutrition & aerobic/resistive exercise along with prescribed medications to achieve LDL 70mg , HDL >40mg .    Expected Outcomes Short Term: Participant states understanding of desired cholesterol values and is compliant with medications prescribed. Participant is following exercise prescription and nutrition guidelines.;Long Term: Cholesterol controlled with medications as prescribed, with individualized exercise RX and with personalized nutrition plan. Value goals: LDL < 70mg , HDL > 40 mg.    Personal Goal Other Yes    Personal Goal Improve walking endurance and be able to do more things that he enjoys.    Intervention Attend CR three days per week and begin a home exercise program.    Expected Outcomes Pt will meet stated goal.             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 He has multiple risk factors for CAD and is participating in  the program for risk modification. He has completed 4 sessions with his current weight at 217.6 lbs. His blood pressure is well controlled. He is wearing a nicotine patch to help with smoking cessation and continues to smoke 5-10 cigarettes/day. His personal goals for the program are to improve his walking ability; be able to do more things he enjoys and get healthier. We will continue to monitor his progress as he works towards meeting these goals. Patient has completed 16 sessions with his current weight at 217.0 lbs maintained since last 30 day review. His blood pressure continues to be well controlled. He continues to wear a nicotine patch to help with smoking cessation and continues to smoke 5-10 cigarettes/day. He called Dr. Einar Gip' office with complaints of muscle pain and attributed the pain to taking Lipitor. He requested another medication.  Dr. Einar Gip changed dosage to QOD or take 1/2 tab to see if pain improved. Patient is seeing Dr. Einar Gip today. His personal goals for the program continue to be to improve his walking ability; be able to do more things he enjoys and get healthier. We will continue to monitor his progress as he works towards meeting these goals.      Expected Outcomes Patient will complete the program meeting both personal and program goals. Patient will complete the program meeting both personal and program goals.               Core Components/Risk Factors/Patient Goals at Discharge (Final Review):   Goals and Risk Factor Review - 12/26/22 0804       Core Components/Risk Factors/Patient Goals Review   Personal Goals Review Improve shortness of breath with ADL's;Tobacco Cessation;Hypertension;Lipids;Other    Review Patient has completed 16 sessions with his current weight at 217.0 lbs maintained since last 30 day review. His blood pressure continues to be well controlled. He continues to wear a nicotine patch to help with smoking cessation and continues to smoke 5-10  cigarettes/day. He called Dr. Einar Gip' office with complaints of muscle pain and attributed the pain to taking Lipitor. He requested another medication. Dr. Einar Gip changed dosage to QOD or take 1/2 tab to see if pain improved. Patient is seeing Dr. Einar Gip today. His personal goals for the program continue to be to improve his walking ability; be able to do more things he enjoys and get healthier. We will continue to monitor his progress as he works towards meeting these goals.    Expected Outcomes Patient will complete the program meeting both personal and program goals.             ITP Comments:   Comments: ITP REVIEW Pt is making expected progress toward Cardiac Rehab goals after completing 19 sessions. Recommend continued exercise, life style modification, education, and increased stamina and strength.

## 2023-01-05 ENCOUNTER — Other Ambulatory Visit: Payer: Self-pay

## 2023-01-05 MED ORDER — EZETIMIBE 10 MG PO TABS: 10.0000 mg | ORAL_TABLET | Freq: Every day | ORAL | 1 refills | Status: DC

## 2023-01-05 MED ORDER — ATORVASTATIN CALCIUM 20 MG PO TABS: 20.0000 mg | ORAL_TABLET | ORAL | 0 refills | Status: DC

## 2023-01-05 MED ORDER — METOPROLOL SUCCINATE ER 25 MG PO TB24: 25.0000 mg | ORAL_TABLET | Freq: Every day | ORAL | 1 refills | Status: DC

## 2023-01-06 ENCOUNTER — Encounter (HOSPITAL_COMMUNITY)
Admission: RE | Admit: 2023-01-06 | Discharge: 2023-01-06 | Disposition: A | Discharge status: Home / Self Care | Payer: Medicare Other | Source: Ambulatory Visit | Attending: Cardiology | Admitting: Cardiology

## 2023-01-06 DIAGNOSIS — I2119 ST elevation (STEMI) myocardial infarction involving other coronary artery of inferior wall: Secondary | ICD-10-CM | POA: Diagnosis not present

## 2023-01-06 DIAGNOSIS — Z955 Presence of coronary angioplasty implant and graft: Secondary | ICD-10-CM

## 2023-01-06 NOTE — Progress Notes (Signed)
Daily Session Note  Patient Details  Name: Leon Mitchell MRN: [id number] Date of Birth: [birth year] Referring Provider:   Flowsheet Row CARDIAC REHAB PHASE II ORIENTATION from 11/15/2022 in [place]  Referring Provider Dr. Einar Gip       Encounter Date: 01/06/2023  Check In:  Session Check In - 01/06/23 1100       Check-In   Supervising physician immediately available to respond to emergencies [facility] MD immediately available    Physician(s) Dr. Harrington Challenger    Location [facility]    Staff Present Hoy Register MHA, MS, ACSM-CEP;Madelyn Flavors, RN, BSN    Virtual Visit No    Medication changes reported     No    Fall or balance concerns reported    Yes    Comments He has had several falls this year. 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 He walks with a rollator.    Tobacco Cessation No Change    Current number of cigarettes/nicotine per day     20    Warm-up and Cool-down Performed as group-led instruction    Resistance Training Performed Yes    VAD Patient? No    PAD/SET Patient? No      Pain Assessment   Currently in Pain? Yes    Pain Score 5     Pain Location Hip    Pain Orientation Right    Pain Descriptors / Indicators Aching    Pain Type Chronic pain    Pain Onset More than a month ago    Pain Frequency Constant    Multiple Pain Sites No             Capillary Blood Glucose: No results found for this or any previous visit (from the past 24 hour(s)).    Social History   Tobacco Use  Smoking Status Every Day   Packs/day: 0.50   Years: 40.00   Additional pack years: 0.00   Total pack years: 20.00   Types: Cigarettes   Passive exposure: Never  Smokeless Tobacco Never    Goals Met:  Independence with exercise equipment Exercise tolerated well No report of concerns or symptoms today Strength training completed today  Goals Unmet:  Not  Applicable  Comments: checkout time is 1200   Dr. Carlyle Dolly is Medical Director for [facility]

## 2023-01-09 ENCOUNTER — Encounter (HOSPITAL_COMMUNITY)
Admission: RE | Admit: 2023-01-09 | Discharge: 2023-01-09 | Disposition: A | Discharge status: Home / Self Care | Payer: Medicare Other | Source: Ambulatory Visit | Attending: Cardiology | Admitting: Cardiology

## 2023-01-09 DIAGNOSIS — Z955 Presence of coronary angioplasty implant and graft: Secondary | ICD-10-CM | POA: Insufficient documentation

## 2023-01-09 DIAGNOSIS — I2119 ST elevation (STEMI) myocardial infarction involving other coronary artery of inferior wall: Secondary | ICD-10-CM | POA: Insufficient documentation

## 2023-01-09 NOTE — Progress Notes (Signed)
Daily Session Note  Patient Details  Name: Leon Mitchell MRN: [id number] Date of Birth: [birth year] Referring Provider:   Flowsheet Row CARDIAC REHAB PHASE II ORIENTATION from 11/15/2022 in [place]  Referring Provider Dr. Einar Gip       Encounter Date: 01/09/2023  Check In:  Session Check In - 01/09/23 1100       Check-In   Supervising physician immediately available to respond to emergencies [facility] immediately available    Physician(s) Dr Dellia Cloud    Location [facility]    Staff Present Hoy Register MHA, MS, ACSM-CEP;Madelyn Flavors, RN, BSN;Heather Mel Almond, BS, Exercise Physiologist    Virtual Visit No    Fall or balance concerns reported    Yes    Comments He has had several falls this year. 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 He walks with a rollator.    Tobacco Cessation No Change    Current number of cigarettes/nicotine per day     20    Warm-up and Cool-down Performed as group-led instruction    Resistance Training Performed Yes    VAD Patient? No    PAD/SET Patient? No      Pain Assessment   Currently in Pain? No/denies    Pain Score 5     Pain Location Hip    Pain Orientation Right    Pain Descriptors / Indicators Aching    Pain Type Chronic pain             Capillary Blood Glucose: No results found for this or any previous visit (from the past 24 hour(s)).    Social History   Tobacco Use  Smoking Status Every Day   Packs/day: 0.50   Years: 40.00   Additional pack years: 0.00   Total pack years: 20.00   Types: Cigarettes   Passive exposure: Never  Smokeless Tobacco Never    Goals Met:  Independence with exercise equipment Exercise tolerated well No report of concerns or symptoms today Strength training completed today  Goals Unmet:  Not Applicable  Comments: Checkout at 1200.   Dr. Carlyle Dolly is Medical Director  for [facility] Cardiac Rehab

## 2023-01-11 ENCOUNTER — Encounter (HOSPITAL_COMMUNITY)
Admission: RE | Admit: 2023-01-11 | Discharge: 2023-01-11 | Disposition: A | Discharge status: Home / Self Care | Payer: Medicare Other | Source: Ambulatory Visit | Attending: Cardiology | Admitting: Cardiology

## 2023-01-11 DIAGNOSIS — Z955 Presence of coronary angioplasty implant and graft: Secondary | ICD-10-CM | POA: Diagnosis not present

## 2023-01-11 DIAGNOSIS — I2119 ST elevation (STEMI) myocardial infarction involving other coronary artery of inferior wall: Secondary | ICD-10-CM | POA: Diagnosis not present

## 2023-01-11 NOTE — Progress Notes (Signed)
Daily Session Note  Patient Details  Name: Leon Mitchell MRN: [id number] Date of Birth: 07-27-1956 Referring Provider:   Flowsheet Row CARDIAC REHAB PHASE II ORIENTATION from 11/15/2022 in [place]  Referring Provider Dr. Einar Gip       Encounter Date: 01/11/2023  Check In:  Session Check In - 01/11/23 1100       Check-In   Supervising physician immediately available to respond to emergencies [facility] immediately available    Physician(s) Dr Dellia Cloud    Location [facility]    Staff Present Aundra Dubin, RN, BSN;Dalton Sherrie George, MS, ACSM-CEP;Leana Roe, BS, Exercise Physiologist;Hillary Troutman BSN, RN    Virtual Visit No    Medication changes reported     No    Fall or balance concerns reported    Yes    Comments He has had several falls this year. 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 He walks with a rollator.    Tobacco Cessation No Change    Current number of cigarettes/nicotine per day     20    Warm-up and Cool-down Performed as group-led instruction    Resistance Training Performed Yes    VAD Patient? No    PAD/SET Patient? No      Pain Assessment   Currently in Pain? Yes    Pain Score 4     Pain Location Hip    Pain Orientation Right    Pain Descriptors / Indicators Aching    Pain Type Chronic pain    Pain Radiating Towards Radiates down leg.    Pain Onset More than a month ago    Pain Frequency Constant    Aggravating Factors  Ambulation    Pain Relieving Factors Tramadol, Tylenol    Effect of Pain on Daily Activities Limits mobility at times.    Multiple Pain Sites No             Capillary Blood Glucose: No results found for this or any previous visit (from the past 24 hour(s)).    Social History   Tobacco Use  Smoking Status Every Day   Packs/day: 0.50   Years: 40.00   Additional pack years: 0.00   Total pack years: 20.00    Types: Cigarettes   Passive exposure: Never  Smokeless Tobacco Never    Goals Met:  Independence with exercise equipment Exercise tolerated well No report of concerns or symptoms today Strength training completed today  Goals Unmet:  Not Applicable  Comments: Check out 1200.   Dr. Carlyle Dolly is Medical Director for [facility] Cardiac Rehab

## 2023-01-13 ENCOUNTER — Encounter (HOSPITAL_COMMUNITY)
Admission: RE | Admit: 2023-01-13 | Discharge: 2023-01-13 | Disposition: A | Discharge status: Home / Self Care | Payer: Medicare Other | Source: Ambulatory Visit | Attending: Cardiology | Admitting: Cardiology

## 2023-01-13 DIAGNOSIS — I2119 ST elevation (STEMI) myocardial infarction involving other coronary artery of inferior wall: Secondary | ICD-10-CM

## 2023-01-13 DIAGNOSIS — Z955 Presence of coronary angioplasty implant and graft: Secondary | ICD-10-CM | POA: Diagnosis not present

## 2023-01-13 NOTE — Progress Notes (Signed)
Daily Session Note  Patient Details  Name: Leon Mitchell MRN: [id number] Date of Birth: [birth year] Referring Provider:   Flowsheet Row CARDIAC REHAB PHASE II ORIENTATION from 11/15/2022 in [facility] CARDIAC REHABILITATION  Referring Provider Dr. Jacinto Halim       Encounter Date: 01/13/2023  Check In:  Session Check In - 01/13/23 1100       Check-In   Supervising physician immediately available to respond to emergencies [facility] MD immediately available    Physician(s) Dr Jenene Slicker    Location [facility]    Staff Present Rodena Medin, RN, BSN;Heather Fredric Mare, BS, Exercise Physiologist;Mega Kinkade Daphine Deutscher, RN, BSN    Virtual Visit No    Medication changes reported     No    Fall or balance concerns reported    Yes    Comments He has had several falls this year. 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 He walks with a rollator.    Tobacco Cessation No Change    Warm-up and Cool-down Performed as group-led instruction    Resistance Training Performed Yes    VAD Patient? No    PAD/SET Patient? No      Pain Assessment   Currently in Pain? No/denies    Pain Score 0-No pain    Multiple Pain Sites No             Capillary Blood Glucose: No results found for this or any previous visit (from the past 24 hour(s)).    Social History   Tobacco Use  Smoking Status Every Day   Packs/day: 0.50   Years: 40.00   Additional pack years: 0.00   Total pack years: 20.00   Types: Cigarettes   Passive exposure: Never  Smokeless Tobacco Never    Goals Met:  Independence with exercise equipment Exercise tolerated well No report of concerns or symptoms today Strength training completed today  Goals Unmet:  Not Applicable  Comments: Checkout at 1200.   Dr. Dina Rich is Medical Director for [facility] Cardiac Rehab

## 2023-01-16 ENCOUNTER — Encounter (HOSPITAL_COMMUNITY)
Admission: RE | Admit: 2023-01-16 | Discharge: 2023-01-16 | Disposition: A | Discharge status: Home / Self Care | Payer: Medicare Other | Source: Ambulatory Visit | Attending: Cardiology | Admitting: Cardiology

## 2023-01-16 VITALS — Wt 217.2 lb

## 2023-01-16 DIAGNOSIS — I2119 ST elevation (STEMI) myocardial infarction involving other coronary artery of inferior wall: Secondary | ICD-10-CM

## 2023-01-16 DIAGNOSIS — Z955 Presence of coronary angioplasty implant and graft: Secondary | ICD-10-CM

## 2023-01-16 NOTE — Progress Notes (Signed)
Daily Session Note  Patient Details  Name: Leon Mitchell MRN: [id number] Date of Birth: 1956/04/26 Referring Provider:   Flowsheet Row CARDIAC REHAB PHASE II ORIENTATION from 11/15/2022 in [facility] CARDIAC REHABILITATION  Referring Provider Dr. Jacinto Halim       Encounter Date: 01/16/2023  Check In:  Session Check In - 01/16/23 1100       Check-In   Supervising physician immediately available to respond to emergencies [facility] MD immediately available    Physician(s) Dr Wyline Mood    Location [facility]    Staff Present Rodena Medin, RN, BSN;Heather Fredric Mare, BS, Exercise Physiologist;Samreen Seltzer Daphine Deutscher, RN, BSN;Dalton Fletcher MHA, MS, ACSM-CEP    Virtual Visit No    Medication changes reported     No    Fall or balance concerns reported    Yes    Comments He has had several falls this year. 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 He walks with a rollator.    Tobacco Cessation No Change    Warm-up and Cool-down Performed as group-led instruction    Resistance Training Performed Yes    VAD Patient? No    PAD/SET Patient? No      Pain Assessment   Currently in Pain? No/denies    Pain Score 0-No pain    Multiple Pain Sites No             Capillary Blood Glucose: No results found for this or any previous visit (from the past 24 hour(s)).    Social History   Tobacco Use  Smoking Status Every Day   Packs/day: 0.50   Years: 40.00   Additional pack years: 0.00   Total pack years: 20.00   Types: Cigarettes   Passive exposure: Never  Smokeless Tobacco Never    Goals Met:  Independence with exercise equipment Exercise tolerated well No report of concerns or symptoms today Strength training completed today  Goals Unmet:  Not Applicable  Comments: Checkout at 1200.   Dr. Dina Rich is Medical Director for [facility] Cardiac Rehab

## 2023-01-18 ENCOUNTER — Encounter (HOSPITAL_COMMUNITY)
Admission: RE | Admit: 2023-01-18 | Discharge: 2023-01-18 | Disposition: A | Discharge status: Home / Self Care | Payer: Medicare Other | Source: Ambulatory Visit | Attending: Cardiology | Admitting: Cardiology

## 2023-01-18 DIAGNOSIS — Z955 Presence of coronary angioplasty implant and graft: Secondary | ICD-10-CM | POA: Diagnosis not present

## 2023-01-18 DIAGNOSIS — I2119 ST elevation (STEMI) myocardial infarction involving other coronary artery of inferior wall: Secondary | ICD-10-CM | POA: Diagnosis not present

## 2023-01-18 NOTE — Progress Notes (Signed)
Daily Session Note  Patient Details  Name: Leon Mitchell MRN: [id number] Date of Birth: October 16, 1955 Referring Provider:   Flowsheet Row CARDIAC REHAB PHASE II ORIENTATION from 11/15/2022 in [facility] CARDIAC REHABILITATION  Referring Provider Dr. Jacinto Halim       Encounter Date: 01/18/2023  Check In:  Session Check In - 01/18/23 1100       Check-In   Supervising physician immediately available to respond to emergencies [facility] MD immediately available    Physician(s) Dr Wyline Mood    Location [facility]    Staff Present Rodena Medin, RN, BSN;Heather Fredric Mare, BS, Exercise Physiologist;Aeden Matranga Daphine Deutscher, RN, BSN    Virtual Visit No    Medication changes reported     No    Fall or balance concerns reported    Yes    Comments He has had several falls this year. 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 He walks with a rollator.    Tobacco Cessation No Change    Warm-up and Cool-down Performed as group-led instruction    Resistance Training Performed Yes      Pain Assessment   Currently in Pain? No/denies    Pain Score 0-No pain    Multiple Pain Sites No             Capillary Blood Glucose: No results found for this or any previous visit (from the past 24 hour(s)).    Social History   Tobacco Use  Smoking Status Every Day   Packs/day: 0.50   Years: 40.00   Additional pack years: 0.00   Total pack years: 20.00   Types: Cigarettes   Passive exposure: Never  Smokeless Tobacco Never    Goals Met:  Independence with exercise equipment Exercise tolerated well No report of concerns or symptoms today Strength training completed today  Goals Unmet:  Not Applicable  Comments: Checkout at 1200.   Dr. Dina Rich is Medical Director for [facility] Cardiac Rehab

## 2023-01-20 ENCOUNTER — Encounter (HOSPITAL_COMMUNITY): Payer: Medicare Other

## 2023-01-23 ENCOUNTER — Encounter (HOSPITAL_COMMUNITY): Payer: Medicare Other

## 2023-01-25 ENCOUNTER — Encounter (HOSPITAL_COMMUNITY)
Admission: RE | Admit: 2023-01-25 | Discharge: 2023-01-25 | Disposition: A | Discharge status: Home / Self Care | Payer: Medicare Other | Source: Ambulatory Visit | Attending: Cardiology | Admitting: Cardiology

## 2023-01-25 DIAGNOSIS — I2119 ST elevation (STEMI) myocardial infarction involving other coronary artery of inferior wall: Secondary | ICD-10-CM

## 2023-01-25 DIAGNOSIS — Z955 Presence of coronary angioplasty implant and graft: Secondary | ICD-10-CM

## 2023-01-25 NOTE — Progress Notes (Signed)
Daily Session Note  Patient Details  Name: Leon Mitchell MRN: [id number] Date of Birth: 1956/09/07 Referring Provider:   Flowsheet Row CARDIAC REHAB PHASE II ORIENTATION from 11/15/2022 in [facility] CARDIAC REHABILITATION  Referring Provider Dr. Jacinto Halim       Encounter Date: 01/25/2023  Check In:  Session Check In - 01/25/23 1057       Check-In   Supervising physician immediately available to respond to emergencies [facility] MD immediately available    Physician(s) Dr. Diona Browner    Location [facility]    Staff Present Ross Ludwig, BS, Exercise Physiologist;Hillary Troutman BSN, RN;Debra Laural Benes, RN, BSN    Virtual Visit No    Medication changes reported     No    Fall or balance concerns reported    Yes    Comments He has had several falls this year. 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 He walks with a rollator.    Tobacco Cessation No Change    Current number of cigarettes/nicotine per day     20    Warm-up and Cool-down Performed as group-led instruction    Resistance Training Performed Yes    VAD Patient? No    PAD/SET Patient? No      Pain Assessment   Currently in Pain? Yes    Pain Score 5     Pain Location Hip    Pain Orientation Right    Pain Descriptors / Indicators Aching    Pain Type Chronic pain    Pain Radiating Towards Radiating down his leg    Pain Onset More than a month ago    Pain Frequency Constant    Aggravating Factors  ambulation    Pain Relieving Factors tylenol, tramadol    Effect of Pain on Daily Activities limits mobility at time    Multiple Pain Sites No             Capillary Blood Glucose: No results found for this or any previous visit (from the past 24 hour(s)).    Social History   Tobacco Use  Smoking Status Every Day   Packs/day: 0.50   Years: 40.00   Additional pack years: 0.00   Total pack years: 20.00   Types: Cigarettes   Passive  exposure: Never  Smokeless Tobacco Never    Goals Met:  Independence with exercise equipment Exercise tolerated well No report of concerns or symptoms today Strength training completed today  Goals Unmet:  Not Applicable  Comments: check out 1200   Dr. Dina Rich is Medical Director for [facility] Cardiac [facility]

## 2023-01-27 ENCOUNTER — Encounter (HOSPITAL_COMMUNITY)
Admission: RE | Admit: 2023-01-27 | Discharge: 2023-01-27 | Disposition: A | Discharge status: Home / Self Care | Payer: Medicare Other | Source: Ambulatory Visit | Attending: Cardiology | Admitting: Cardiology

## 2023-01-27 DIAGNOSIS — I2119 ST elevation (STEMI) myocardial infarction involving other coronary artery of inferior wall: Secondary | ICD-10-CM | POA: Diagnosis not present

## 2023-01-27 DIAGNOSIS — Z955 Presence of coronary angioplasty implant and graft: Secondary | ICD-10-CM | POA: Diagnosis not present

## 2023-01-27 NOTE — Progress Notes (Signed)
Daily Session Note  Patient Details  Name: Leon Mitchell MRN: [id number] Date of Birth: 12/22/1955 Referring Provider:   Flowsheet Row CARDIAC REHAB PHASE II ORIENTATION from 11/15/2022 in [facility] CARDIAC REHABILITATION  Referring Provider Dr. Jacinto Halim       Encounter Date: 01/27/2023  Check In:  Session Check In - 01/27/23 1100       Check-In   Supervising physician immediately available to respond to emergencies [facility] MD immediately available    Physician(s) Dr. Dina Rich    Location [facility]    Staff Present Ross Ludwig, BS, Exercise Physiologist;Madisson Kulaga Laural Benes, RN, BSN;Other   Kary Kos   Virtual Visit No    Medication changes reported     No    Fall or balance concerns reported    Yes    Comments He has had several falls this year. 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 He walks with a rollator.    Tobacco Cessation No Change    Current number of cigarettes/nicotine per day     20    Warm-up and Cool-down Performed as group-led instruction    PAD/SET Patient? No      Pain Assessment   Currently in Pain? No/denies    Pain Score 7     Pain Location Hip    Pain Orientation Left    Pain Descriptors / Indicators Aching    Pain Type Chronic pain    Pain Radiating Towards Radiating down his leg    Pain Onset More than a month ago    Pain Frequency Constant    Aggravating Factors  Ambulation    Pain Relieving Factors Tylenol, Tramadol    Effect of Pain on Daily Activities Limits mobility at times    Multiple Pain Sites No      3rd Pain Site   Pain Score 7    Pain Location Knee    Pain Orientation Right    Pain Descriptors / Indicators Aching    Pain Frequency Constant      2nd Pain Site   Pain Type Chronic pain      Pain Screening   Pain Relieving Factors Tramadol, Tylenol             Capillary Blood Glucose: No results found for this or any previous  visit (from the past 24 hour(s)).    Social History   Tobacco Use  Smoking Status Every Day   Packs/day: 0.50   Years: 40.00   Additional pack years: 0.00   Total pack years: 20.00   Types: Cigarettes   Passive exposure: Never  Smokeless Tobacco Never    Goals Met:  Independence with exercise equipment Exercise tolerated well No report of concerns or symptoms today Strength training completed today  Goals Unmet:  Not Applicable  Comments: Check out 1200.   Dr. Dina Rich is Medical Director for [facility] Cardiac Rehab

## 2023-01-30 ENCOUNTER — Encounter (HOSPITAL_COMMUNITY)
Admission: RE | Admit: 2023-01-30 | Discharge: 2023-01-30 | Disposition: A | Discharge status: Home / Self Care | Payer: Medicare Other | Source: Ambulatory Visit | Attending: Cardiology | Admitting: Cardiology

## 2023-01-30 VITALS — Wt 222.2 lb

## 2023-01-30 DIAGNOSIS — Z955 Presence of coronary angioplasty implant and graft: Secondary | ICD-10-CM | POA: Diagnosis not present

## 2023-01-30 DIAGNOSIS — I2119 ST elevation (STEMI) myocardial infarction involving other coronary artery of inferior wall: Secondary | ICD-10-CM | POA: Diagnosis not present

## 2023-01-30 NOTE — Progress Notes (Signed)
Daily Session Note  Patient Details  Name: Leon Mitchell MRN: [id number] Date of Birth: Mar 01, 1956 Referring Provider:   Flowsheet Row CARDIAC REHAB PHASE II ORIENTATION from 11/15/2022 in [facility] CARDIAC REHABILITATION  Referring Provider Dr. Jacinto Halim       Encounter Date: 01/30/2023  Check In:  Session Check In - 01/30/23 1100       Check-In   Supervising physician immediately available to respond to emergencies [facility] MD immediately available    Physician(s) Dr. Jenene Slicker    Location [facility]    Staff Present Ross Ludwig, BS, Exercise Physiologist;Debra Laural Benes, RN, BSN;Other    Virtual Visit No    Medication changes reported     No    Fall or balance concerns reported    Yes    Comments He has had several falls this year. 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 He walks with a rollator.    Tobacco Cessation No Change    Current number of cigarettes/nicotine per day     20    Warm-up and Cool-down Performed as group-led instruction    Resistance Training Performed Yes    VAD Patient? No    PAD/SET Patient? No      Pain Assessment   Currently in Pain? Yes    Pain Score 7     Pain Location Hip    Pain Orientation Left    Pain Descriptors / Indicators Aching    Pain Type Chronic pain    Pain Onset More than a month ago    Pain Frequency Constant    Multiple Pain Sites No             Capillary Blood Glucose: No results found for this or any previous visit (from the past 24 hour(s)).    Social History   Tobacco Use  Smoking Status Every Day   Packs/day: 0.50   Years: 40.00   Additional pack years: 0.00   Total pack years: 20.00   Types: Cigarettes   Passive exposure: Never  Smokeless Tobacco Never    Goals Met:  Independence with exercise equipment Exercise tolerated well No report of concerns or symptoms today Strength training completed today  Goals  Unmet:  Not Applicable  Comments: check out 1200   Dr. Dina Rich is Medical Director for [facility]

## 2023-02-01 ENCOUNTER — Encounter (HOSPITAL_COMMUNITY)
Admission: RE | Admit: 2023-02-01 | Discharge: 2023-02-01 | Disposition: A | Discharge status: Home / Self Care | Payer: Medicare Other | Source: Ambulatory Visit | Attending: Cardiology | Admitting: Cardiology

## 2023-02-01 DIAGNOSIS — Z955 Presence of coronary angioplasty implant and graft: Secondary | ICD-10-CM | POA: Diagnosis not present

## 2023-02-01 DIAGNOSIS — I2119 ST elevation (STEMI) myocardial infarction involving other coronary artery of inferior wall: Secondary | ICD-10-CM

## 2023-02-01 NOTE — Progress Notes (Signed)
Cardiac Individual Treatment Plan  Patient Details  Name: Leon Mitchell MRN: [id number] Date of Birth: October 01, 1956 Referring Provider:   Flowsheet Row CARDIAC REHAB PHASE II ORIENTATION from 11/15/2022 in [facility] CARDIAC REHABILITATION  Referring Provider Dr. Jacinto Halim       Initial Encounter Date:  Flowsheet Row CARDIAC REHAB PHASE II ORIENTATION from 11/15/2022 in [place] CARDIAC REHABILITATION  Date 11/15/22       Visit Diagnosis: Acute ST elevation myocardial infarction (STEMI) of inferolateral wall  Status post coronary artery stent placement  Patient's Home Medications on Admission:  Current Outpatient Medications:    aspirin EC 81 MG tablet, Take 81 mg by mouth in the morning. Swallow whole., Disp: , Rfl:    atorvastatin (LIPITOR) 20 MG tablet, Take 1 tablet (20 mg total) by mouth every other day., Disp: 90 tablet, Rfl: 0   cyclobenzaprine (FLEXERIL) 10 MG tablet, Take 1 tablet (10 mg total) by mouth 3 (three) times daily as needed for muscle spasms., Disp: 30 tablet, Rfl: 0   diclofenac (VOLTAREN) 75 MG EC tablet, Take 75 mg by mouth 2 (two) times daily as needed for moderate pain., Disp: , Rfl:    ezetimibe (ZETIA) 10 MG tablet, Take 1 tablet (10 mg total) by mouth daily., Disp: 90 tablet, Rfl: 1   gabapentin (NEURONTIN) 300 MG capsule, Take 600 mg by mouth 3 (three) times daily., Disp: , Rfl:    HYDROcodone-acetaminophen (NORCO/VICODIN) 5-325 MG tablet, Take 1 tablet by mouth 3 (three) times daily as needed for moderate pain., Disp: , Rfl:    lisinopril (ZESTRIL) 20 MG tablet, Take 1 tablet (20 mg total) by mouth daily., Disp: 90 tablet, Rfl: 3   metoprolol succinate (TOPROL-XL) 25 MG 24 hr tablet, Take 1 tablet (25 mg total) by mouth daily., Disp: 90 tablet, Rfl: 1   nicotine (EQL NICOTINE) 21 mg/24hr patch, Place 1 patch (21 mg total) onto the skin daily., Disp: 28 patch, Rfl: 0   nitroGLYCERIN (NITROSTAT) 0.4 MG SL tablet, Place 1 tablet (0.4 mg total) under the tongue  every 5 (five) minutes as needed for up to 25 days for chest pain., Disp: 25 tablet, Rfl: 3   prasugrel (EFFIENT) 10 MG TABS tablet, Take 1 tablet (10 mg total) by mouth daily., Disp: 90 tablet, Rfl: 3   tamsulosin (FLOMAX) 0.4 MG CAPS capsule, Take 0.4 mg by mouth daily., Disp: , Rfl:    traMADol (ULTRAM) 50 MG tablet, TAKE 1 TABLET BY MOUTH TWICE DAILY AS NEEDED, Disp: 60 tablet, Rfl: 1   Vitamin D, Ergocalciferol, (DRISDOL) 1.25 MG (50000 UNIT) CAPS capsule, Take 50,000 Units by mouth every Sunday., Disp: , Rfl:   Past Medical History: Past Medical History:  Diagnosis Date   Bulging lumbar disc    GERD (gastroesophageal reflux disease)    History of kidney stones    Hypertension    Pneumonia    May 2023    Tobacco Use: Social History   Tobacco Use  Smoking Status Every Day   Packs/day: 0.50   Years: 40.00   Additional pack years: 0.00   Total pack years: 20.00   Types: Cigarettes   Passive exposure: Never  Smokeless Tobacco Never    Labs: Review Flowsheet       Latest Ref Rng & Units 04/20/2022 10/18/2022 10/19/2022 12/23/2022  Labs for ITP Cardiac and Pulmonary Rehab  Cholestrol 100 - 199 mg/dL 536  644  034  742   LDL (calc) 0 - 99 mg/dL 595  159  147  91   HDL-C >39 mg/dL 34  35  41  38   Trlycerides 0 - 149 mg/dL 086  84  75  77   Hemoglobin A1c 4.8 - 5.6 % - 5.1  - -  TCO2 22 - 32 mmol/L - 24  - -    Capillary Blood Glucose: No results found for: "GLUCAP"   Exercise Target Goals: Exercise Program Goal: Individual exercise prescription set using results from initial 6 min walk test and THRR while considering  patient's activity barriers and safety.   Exercise Prescription Goal: Starting with aerobic activity 30 plus minutes a day, 3 days per week for initial exercise prescription. Provide home exercise prescription and guidelines that participant acknowledges understanding prior to discharge.  Activity Barriers & Risk Stratification:  Activity Barriers &  Cardiac Risk Stratification - 11/15/22 1327       Activity Barriers & Cardiac Risk Stratification   Activity Barriers Arthritis;Back Problems;Neck/Spine Problems;Joint Problems;Deconditioning;Muscular Weakness;Balance Concerns;History of Falls;Assistive Device    Cardiac Risk Stratification High             6 Minute Walk:  6 Minute Walk     Row Name 11/15/22 1445         6 Minute Walk   Phase Initial     Distance 650 feet     Walk Time 6 minutes     # of Rest Breaks 0     MPH 1.23     METS 1.35     RPE 12     VO2 Peak 4.74     Symptoms Yes (comment)     Comments Arms fatigued due to holding himself up     Resting HR 50 bpm     Resting BP 124/60     Resting Oxygen Saturation  97 %     Exercise Oxygen Saturation  during 6 min walk 99 %     Max Ex. HR 60 bpm     Max Ex. BP 128/64     2 Minute Post BP 118/60              Oxygen Initial Assessment:   Oxygen Re-Evaluation:   Oxygen Discharge (Final Oxygen Re-Evaluation):   Initial Exercise Prescription:  Initial Exercise Prescription - 11/15/22 1400       Date of Initial Exercise RX and Referring Provider   Date 11/15/22    Referring Provider Dr. Jacinto Halim    Expected Discharge Date 02/10/23      NuStep   Level 1    SPM 60    Minutes 22      Arm Ergometer   Level 1    RPM 45    Minutes 17      Prescription Details   Frequency (times per week) 3    Duration Progress to 30 minutes of continuous aerobic without signs/symptoms of physical distress      Intensity   THRR 40-80% of Max Heartrate 61-122    Ratings of Perceived Exertion 11-13      Resistance Training   Training Prescription Yes    Weight 4    Reps 10-15             Perform Capillary Blood Glucose checks as needed.  Exercise Prescription Changes:   Exercise Prescription Changes     Row Name 11/21/22 1200 12/05/22 1400 12/19/22 1200 01/02/23 1400 01/16/23 1200     Response to Exercise   Blood Pressure (Admit) 114/60  114/68  120/68 110/60 122/60   Blood Pressure (Exercise) 120/60 108/66 138/64 112/68 124/66   Blood Pressure (Exit) 110/56 114/74 120/60 128/72 118/62   Heart Rate (Admit) 55 bpm 84 bpm 53 bpm 55 bpm 57 bpm   Heart Rate (Exercise) 57 bpm 106 bpm 71 bpm 75 bpm 66 bpm   Heart Rate (Exit) 51 bpm 82 bpm 62 bpm 62 bpm 65 bpm   Rating of Perceived Exertion (Exercise) 11 11 12 12 12    Duration Continue with 30 min of aerobic exercise without signs/symptoms of physical distress. Continue with 30 min of aerobic exercise without signs/symptoms of physical distress. Continue with 30 min of aerobic exercise without signs/symptoms of physical distress. Continue with 30 min of aerobic exercise without signs/symptoms of physical distress. Continue with 30 min of aerobic exercise without signs/symptoms of physical distress.   Intensity THRR unchanged THRR unchanged THRR unchanged THRR unchanged THRR unchanged     Progression   Progression Continue to progress workloads to maintain intensity without signs/symptoms of physical distress. Continue to progress workloads to maintain intensity without signs/symptoms of physical distress. Continue to progress workloads to maintain intensity without signs/symptoms of physical distress. Continue to progress workloads to maintain intensity without signs/symptoms of physical distress. Continue to progress workloads to maintain intensity without signs/symptoms of physical distress.     Resistance Training   Training Prescription -- Yes Yes Yes Yes   Weight -- 4 4 4 4    Reps -- 10-15 10-15 10-15 10-15   Time -- 10 Minutes 10 Minutes 10 Minutes 10 Minutes     NuStep   Level 1 3 4 3 4    SPM 50 76 79 79 82   Minutes 17 17 17 17 17    METs 1.81 1.87 1.99 1.93 2.41     Arm Ergometer   Level 1 3 3.6 3.8 4.5   RPM 52 50 48 49 49   Minutes 22 22 22 22 22    METs 1.64 2.07 2.28 2.49 2.81    Row Name 01/30/23 1300             Response to Exercise   Blood Pressure (Admit)  126/60       Blood Pressure (Exercise) 146/70       Blood Pressure (Exit) 124/56       Heart Rate (Admit) 52 bpm       Heart Rate (Exercise) 68 bpm       Heart Rate (Exit) 90 bpm       Rating of Perceived Exertion (Exercise) 12       Duration Continue with 30 min of aerobic exercise without signs/symptoms of physical distress.       Intensity THRR unchanged         Progression   Progression Continue to follow PAD protocol         Resistance Training   Training Prescription Yes       Weight 4       Reps 10-15       Time 10 Minutes         NuStep   Level 4       SPM 98       Minutes 17       METs 2.26         Arm Ergometer   Level 4.1       RPM 53       Minutes 22       METs 3.13  Exercise Comments:   Exercise Goals and Review:   Exercise Goals     Row Name 11/15/22 1447 12/05/22 1416 01/02/23 1453 01/30/23 1352       Exercise Goals   Increase Physical Activity Yes Yes Yes Yes    Intervention Provide advice, education, support and counseling about physical activity/exercise needs.;Develop an individualized exercise prescription for aerobic and resistive training based on initial evaluation findings, risk stratification, comorbidities and participant's personal goals. Provide advice, education, support and counseling about physical activity/exercise needs.;Develop an individualized exercise prescription for aerobic and resistive training based on initial evaluation findings, risk stratification, comorbidities and participant's personal goals. Provide advice, education, support and counseling about physical activity/exercise needs.;Develop an individualized exercise prescription for aerobic and resistive training based on initial evaluation findings, risk stratification, comorbidities and participant's personal goals. Provide advice, education, support and counseling about physical activity/exercise needs.;Develop an individualized exercise prescription for  aerobic and resistive training based on initial evaluation findings, risk stratification, comorbidities and participant's personal goals.    Expected Outcomes Short Term: Attend rehab on a regular basis to increase amount of physical activity.;Long Term: Add in home exercise to make exercise part of routine and to increase amount of physical activity.;Long Term: Exercising regularly at least 3-5 days a week. Short Term: Attend rehab on a regular basis to increase amount of physical activity.;Long Term: Add in home exercise to make exercise part of routine and to increase amount of physical activity.;Long Term: Exercising regularly at least 3-5 days a week. Short Term: Attend rehab on a regular basis to increase amount of physical activity.;Long Term: Add in home exercise to make exercise part of routine and to increase amount of physical activity.;Long Term: Exercising regularly at least 3-5 days a week. Short Term: Attend rehab on a regular basis to increase amount of physical activity.;Long Term: Add in home exercise to make exercise part of routine and to increase amount of physical activity.;Long Term: Exercising regularly at least 3-5 days a week.    Increase Strength and Stamina Yes Yes Yes Yes    Intervention Provide advice, education, support and counseling about physical activity/exercise needs.;Develop an individualized exercise prescription for aerobic and resistive training based on initial evaluation findings, risk stratification, comorbidities and participant's personal goals. Provide advice, education, support and counseling about physical activity/exercise needs.;Develop an individualized exercise prescription for aerobic and resistive training based on initial evaluation findings, risk stratification, comorbidities and participant's personal goals. Provide advice, education, support and counseling about physical activity/exercise needs.;Develop an individualized exercise prescription for aerobic  and resistive training based on initial evaluation findings, risk stratification, comorbidities and participant's personal goals. Provide advice, education, support and counseling about physical activity/exercise needs.;Develop an individualized exercise prescription for aerobic and resistive training based on initial evaluation findings, risk stratification, comorbidities and participant's personal goals.    Expected Outcomes Short Term: Increase workloads from initial exercise prescription for resistance, speed, and METs.;Short Term: Perform resistance training exercises routinely during rehab and add in resistance training at home;Long Term: Improve cardiorespiratory fitness, muscular endurance and strength as measured by increased METs and functional capacity ( ) Short Term: Increase workloads from initial exercise prescription for resistance, speed, and METs.;Short Term: Perform resistance training exercises routinely during rehab and add in resistance training at home;Long Term: Improve cardiorespiratory fitness, muscular endurance and strength as measured by increased METs and functional capacity ( ) Short Term: Increase workloads from initial exercise prescription for resistance, speed, and METs.;Short Term: Perform resistance training exercises routinely during rehab and add in  resistance training at home;Long Term: Improve cardiorespiratory fitness, muscular endurance and strength as measured by increased METs and functional capacity ( ) Short Term: Increase workloads from initial exercise prescription for resistance, speed, and METs.;Short Term: Perform resistance training exercises routinely during rehab and add in resistance training at home;Long Term: Improve cardiorespiratory fitness, muscular endurance and strength as measured by increased METs and functional capacity ( )    Able to understand and use rate of perceived exertion (RPE) scale Yes Yes Yes Yes    Intervention Provide  education and explanation on how to use RPE scale Provide education and explanation on how to use RPE scale Provide education and explanation on how to use RPE scale Provide education and explanation on how to use RPE scale    Expected Outcomes Short Term: Able to use RPE daily in rehab to express subjective intensity level;Long Term:  Able to use RPE to guide intensity level when exercising independently Short Term: Able to use RPE daily in rehab to express subjective intensity level;Long Term:  Able to use RPE to guide intensity level when exercising independently Short Term: Able to use RPE daily in rehab to express subjective intensity level;Long Term:  Able to use RPE to guide intensity level when exercising independently Short Term: Able to use RPE daily in rehab to express subjective intensity level;Long Term:  Able to use RPE to guide intensity level when exercising independently    Knowledge and understanding of Target Heart Rate Range (THRR) Yes Yes Yes Yes    Intervention Provide education and explanation of THRR including how the numbers were predicted and where they are located for reference Provide education and explanation of THRR including how the numbers were predicted and where they are located for reference Provide education and explanation of THRR including how the numbers were predicted and where they are located for reference Provide education and explanation of THRR including how the numbers were predicted and where they are located for reference    Expected Outcomes Short Term: Able to state/look up THRR;Long Term: Able to use THRR to govern intensity when exercising independently;Short Term: Able to use daily as guideline for intensity in rehab Short Term: Able to state/look up THRR;Long Term: Able to use THRR to govern intensity when exercising independently;Short Term: Able to use daily as guideline for intensity in rehab Short Term: Able to state/look up THRR;Long Term: Able to use  THRR to govern intensity when exercising independently;Short Term: Able to use daily as guideline for intensity in rehab Short Term: Able to state/look up THRR;Long Term: Able to use THRR to govern intensity when exercising independently;Short Term: Able to use daily as guideline for intensity in rehab    Able to check pulse independently Yes -- Yes Yes    Intervention Provide education and demonstration on how to check pulse in carotid and radial arteries.;Review the importance of being able to check your own pulse for safety during independent exercise Provide education and demonstration on how to check pulse in carotid and radial arteries.;Review the importance of being able to check your own pulse for safety during independent exercise Provide education and demonstration on how to check pulse in carotid and radial arteries.;Review the importance of being able to check your own pulse for safety during independent exercise Provide education and demonstration on how to check pulse in carotid and radial arteries.;Review the importance of being able to check your own pulse for safety during independent exercise    Expected Outcomes Short Term: Able  to explain why pulse checking is important during independent exercise;Long Term: Able to check pulse independently and accurately Short Term: Able to explain why pulse checking is important during independent exercise;Long Term: Able to check pulse independently and accurately Short Term: Able to explain why pulse checking is important during independent exercise;Long Term: Able to check pulse independently and accurately Short Term: Able to explain why pulse checking is important during independent exercise;Long Term: Able to check pulse independently and accurately    Understanding of Exercise Prescription Yes Yes Yes Yes    Intervention Provide education, explanation, and written materials on patient's individual exercise prescription Provide education,  explanation, and written materials on patient's individual exercise prescription Provide education, explanation, and written materials on patient's individual exercise prescription Provide education, explanation, and written materials on patient's individual exercise prescription    Expected Outcomes Short Term: Able to explain program exercise prescription;Long Term: Able to explain home exercise prescription to exercise independently Short Term: Able to explain program exercise prescription;Long Term: Able to explain home exercise prescription to exercise independently Short Term: Able to explain program exercise prescription;Long Term: Able to explain home exercise prescription to exercise independently Short Term: Able to explain program exercise prescription;Long Term: Able to explain home exercise prescription to exercise independently             Exercise Goals Re-Evaluation :  Exercise Goals Re-Evaluation     Row Name 12/05/22 1417 01/02/23 1454 01/30/23 1352 01/30/23 1409       Exercise Goal Re-Evaluation   Exercise Goals Review Increase Physical Activity;Increase Strength and Stamina;Able to understand and use rate of perceived exertion (RPE) scale;Knowledge and understanding of Target Heart Rate Range (THRR);Able to check pulse independently;Understanding of Exercise Prescription Increase Physical Activity;Increase Strength and Stamina;Able to understand and use rate of perceived exertion (RPE) scale;Knowledge and understanding of Target Heart Rate Range (THRR);Able to check pulse independently;Understanding of Exercise Prescription Increase Physical Activity;Increase Strength and Stamina;Able to understand and use rate of perceived exertion (RPE) scale;Knowledge and understanding of Target Heart Rate Range (THRR);Able to check pulse independently;Understanding of Exercise Prescription (P)  Increase Physical Activity;Increase Strength and Stamina;Able to understand and use rate of  perceived exertion (RPE) scale;Knowledge and understanding of Target Heart Rate Range (THRR);Able to check pulse independently;Understanding of Exercise Prescription    Comments Pt has completed 8 sessions of cardiac rehab. He is deconditioned due to hip and knee pain and but is starting to progress. He uses his rollator during warm uop for stability. He is curretnly exercising at 2.07 METs on the arm ergometer. Will continue to montior and progress as able. Pt has completed 19 sessions of cardiac rehab. He continues to have hip and knee pain but is not affecting his progression, he continue to increase his workload on the stepper. He is currently exercising at 2.49 METs on the arm ergometer. Will continue to monitor and progress as able. -- Pt has completed 29 sessions of cardiac rehab. He continues to have hip and knee pain that affects his progression. He is currently exercising at 3.13 METs on the steppr. Will continue to monitor and progress as able.    Expected Outcomes Through exercise at rehab and home, the patient will meet thier stated goals. Through exercise at rehab and home, the patient will meet thier stated goals. Through exercise at rehab and home, the patient will meet thier stated goals. (P)  Through exercise at rehab and home, the patient will meet thier stated goals.  Discharge Exercise Prescription (Final Exercise Prescription Changes):  Exercise Prescription Changes - 01/30/23 1300       Response to Exercise   Blood Pressure (Admit) 126/60    Blood Pressure (Exercise) 146/70    Blood Pressure (Exit) 124/56    Heart Rate (Admit) 52 bpm    Heart Rate (Exercise) 68 bpm    Heart Rate (Exit) 90 bpm    Rating of Perceived Exertion (Exercise) 12    Duration Continue with 30 min of aerobic exercise without signs/symptoms of physical distress.    Intensity THRR unchanged      Progression   Progression Continue to follow PAD protocol      Resistance Training    Training Prescription Yes    Weight 4    Reps 10-15    Time 10 Minutes      NuStep   Level 4    SPM 98    Minutes 17    METs 2.26      Arm Ergometer   Level 4.1    RPM 53    Minutes 22    METs 3.13             Nutrition:  Target Goals: Understanding of nutrition guidelines, daily intake of sodium 1500mg , cholesterol 200mg , calories 30% from fat and 7% or less from saturated fats, daily to have 5 or more servings of fruits and vegetables.  Biometrics:  Pre Biometrics - 11/15/22 1448       Pre Biometrics   Height 5\' 11"  (1.803 m)    Weight 98.6 kg    Waist Circumference 41 inches    Hip Circumference 39 inches    Waist to Hip Ratio 1.05 %    BMI (Calculated) 30.33    Triceps Skinfold 8 mm    % Body Fat 26.1 %    Grip Strength 29.4 kg    Flexibility 0 in    Single Leg Stand 0 seconds              Nutrition Therapy Plan and Nutrition Goals:  Nutrition Therapy & Goals - 11/16/22 0653       Personal Nutrition Goals   Comments Patient's diet assessment score was 50. We offer educational sessions on heart healthy nutrition with handouts and assistance with RD referral if patient is interested.      Intervention Plan   Intervention Nutrition handout(s) given to patient.    Expected Outcomes Short Term Goal: Understand basic principles of dietary content, such as calories, fat, sodium, cholesterol and nutrients.             Nutrition Assessments:  Nutrition Assessments - 11/15/22 1337       MEDFICTS Scores   Pre Score 50            MEDIFICTS Score Key: ?70 Need to make dietary changes  40-70 Heart Healthy Diet ? 40 Therapeutic Level Cholesterol Diet   Picture Your Plate Scores: <95 Unhealthy dietary pattern with much room for improvement. 41-50 Dietary pattern unlikely to meet recommendations for good health and room for improvement. 51-60 More healthful dietary pattern, with some room for improvement.  >60 Healthy dietary pattern,  although there may be some specific behaviors that could be improved.    Nutrition Goals Re-Evaluation:   Nutrition Goals Discharge (Final Nutrition Goals Re-Evaluation):   Psychosocial: Target Goals: Acknowledge presence or absence of significant depression and/or stress, maximize coping skills, provide positive support system. Participant is able to verbalize types and ability  to use techniques and skills needed for reducing stress and depression.  Initial Review & Psychosocial Screening:  Initial Psych Review & Screening - 11/15/22 1328       Initial Review   Current issues with Current Sleep Concerns      Family Dynamics   Good Support System? Yes    Comments His girlfriend supports him.      Barriers   Psychosocial barriers to participate in program There are no identifiable barriers or psychosocial needs.      Screening Interventions   Interventions Encouraged to exercise    Expected Outcomes Long Term goal: The participant improves quality of Life and PHQ9 Scores as seen by post scores and/or verbalization of changes;Short Term goal: Identification and review with participant of any Quality of Life or Depression concerns found by scoring the questionnaire.             Quality of Life Scores:  Quality of Life - 11/15/22 1448       Quality of Life   Select Quality of Life      Quality of Life Scores   Health/Function Pre 15.97 %    Socioeconomic Pre 22.08 %    Psych/Spiritual Pre 19.21 %    Family Pre 28.3 %    GLOBAL Pre 19.64 %            Scores of 19 and below usually indicate a poorer quality of life in these areas.  A difference of  2-3 points is a clinically meaningful difference.  A difference of 2-3 points in the total score of the Quality of Life Index has been associated with significant improvement in overall quality of life, self-image, physical symptoms, and general health in studies assessing change in quality of life.  PHQ-9: Review  Flowsheet       11/15/2022  Depression screen PHQ 2/9  Decreased Interest 3  Down, Depressed, Hopeless 1  PHQ - 2 Score 4  Altered sleeping 1  Tired, decreased energy 3  Change in appetite 1  Feeling bad or failure about yourself  0  Trouble concentrating 1  Moving slowly or fidgety/restless 0  Suicidal thoughts 0  PHQ-9 Score 10  Difficult doing work/chores Somewhat difficult   Interpretation of Total Score  Total Score Depression Severity:  1-4 = Minimal depression, 5-9 = Mild depression, 10-14 = Moderate depression, 15-19 = Moderately severe depression, 20-27 = Severe depression   Psychosocial Evaluation and Intervention:  Psychosocial Evaluation - 11/15/22 1423       Psychosocial Evaluation & Interventions   Interventions Relaxation education;Stress management education;Encouraged to exercise with the program and follow exercise prescription    Comments Pt has no barriers to participate in CR. He reports altered sleep, and he has just started to take melatonin for this. He has no other identifiable psychosocial issues. He did score a 10 on his PHQ-9, and he relates this to his health and denies depression. He states he has little interest in doing things and that he feels tired almost every day. This is due to his severe orthopedic issues. 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 All of this has been delayed until at least late summer due to his recent STEMI and stent. The pain that this causes has made him sedentary which has further worsened his energy levels. He continues to smoke 5-10 cigarettes per day, which is down from the 1 pack per day he was smoking before his STEMI. He is  wearing a nicotine patch and reports some benefit. He admits that the reason he cannot quit is due to lack of willpower. He knows that he must get his mind ready to quit before he can do so. He did quit some years ago while taking Chantix, but he does not want to take Chantix again. He states  that he is already taking enough pills as it is. He also admits that his lack of willpower is why he is still eating an unhealthy diet. He reports making no dietary changes since his STEMI, even though he knows he should. He hopes  that this program will help give him a push in the right direction. His goals while in the program are to improve his walking abillities and to be able to do more things that he enjoys. He was unsure if he would be able to do the program due to his orthopedic limitations, but he was able to do the arm ergometer and stepper at orientation. He is eager to start the program.    Expected Outcomes Pt will continue to have no identifiable psychosocial issues.    Continue Psychosocial Services  No Follow up required             Psychosocial Re-Evaluation:  Psychosocial Re-Evaluation     Row Name 11/28/22 0815 12/26/22 0803 01/23/23 4098         Psychosocial Re-Evaluation   Current issues with Current Sleep Concerns Current Sleep Concerns Current Sleep Concerns     Comments Patient is new to the program. He has completed 4 sessions. He continues to have no psychosocial barriers identified. He seems to enjoy the sessions and demonstrates an interest in improving his health. We will continue to monitor his progress. Patient has completed 16 sessions. He continues to have no psychosocial barriers identified. He continues to enjoy the sessions and demonstrates an interest in improving his health. We will continue to monitor his progress. Patient has completed 26 sessions. He continues to have no psychosocial barriers identified. He continues to enjoy the sessions and demonstrates an interest in improving his health. We will continue to monitor his progress.     Expected Outcomes Patient will continue to have no psychosocial barriers identified. Patient will continue to have no psychosocial barriers identified. Patient will continue to have no psychosocial barriers identified.      Interventions Stress management education;Relaxation education;Encouraged to attend Cardiac Rehabilitation for the exercise Stress management education;Relaxation education;Encouraged to attend Cardiac Rehabilitation for the exercise Stress management education;Relaxation education;Encouraged to attend Cardiac Rehabilitation for the exercise     Continue Psychosocial Services  No Follow up required No Follow up required No Follow up required              Psychosocial Discharge (Final Psychosocial Re-Evaluation):  Psychosocial Re-Evaluation - 01/23/23 0728       Psychosocial Re-Evaluation   Current issues with Current Sleep Concerns    Comments Patient has completed 26 sessions. He continues to have no psychosocial barriers identified. He continues to enjoy the sessions and demonstrates an interest in improving his health. We will continue to monitor his progress.    Expected Outcomes Patient will continue to have no psychosocial barriers identified.    Interventions Stress management education;Relaxation education;Encouraged to attend Cardiac Rehabilitation for the exercise    Continue Psychosocial Services  No Follow up required             Vocational Rehabilitation: Provide vocational rehab assistance to qualifying candidates.  Vocational Rehab Evaluation & Intervention:  Vocational Rehab - 11/15/22 1353       Initial Vocational Rehab Evaluation & Intervention   Assessment shows need for Vocational Rehabilitation No      Vocational Rehab Re-Evaulation   Comments retired             Education: Education Goals: Education classes will be provided on a weekly basis, covering required topics. Participant will state understanding/return demonstration of topics presented.  Learning Barriers/Preferences:  Learning Barriers/Preferences - 11/15/22 1341       Learning Barriers/Preferences   Learning Barriers None    Learning Preferences Skilled Demonstration;Written  Material             Education Topics: Hypertension, Hypertension Reduction -Define heart disease and high blood pressure. Discus how high blood pressure affects the body and ways to reduce high blood pressure. Flowsheet Row CARDIAC REHAB PHASE II EXERCISE from 02/01/2023 in [place] CARDIAC REHABILITATION  Date 01/18/23  Educator DJ  Instruction Review Code 2- Demonstrated Understanding       Exercise and Your Heart -Discuss why it is important to exercise, the FITT principles of exercise, normal and abnormal responses to exercise, and how to exercise safely. Flowsheet Row CARDIAC REHAB PHASE II EXERCISE from 02/01/2023 in [place] CARDIAC REHABILITATION  Date 01/25/23  Educator DJ.  Instruction Review Code 2- Demonstrated Understanding       Angina -Discuss definition of angina, causes of angina, treatment of angina, and how to decrease risk of having angina. Flowsheet Row CARDIAC REHAB PHASE II EXERCISE from 02/01/2023 in [place] CARDIAC REHABILITATION  Date 02/01/23  Educator HB  Instruction Review Code 1- Verbalizes Understanding       Cardiac Medications -Review what the following cardiac medications are used for, how they affect the body, and side effects that may occur when taking the medications.  Medications include Aspirin, Beta blockers, calcium channel blockers, ACE Inhibitors, angiotensin receptor blockers, diuretics, digoxin, and antihyperlipidemics.   Congestive Heart Failure -Discuss the definition of CHF, how to live with CHF, the signs and symptoms of CHF, and how keep track of weight and sodium intake.   Heart Disease and Intimacy -Discus the effect sexual activity has on the heart, how changes occur during intimacy as we age, and safety during sexual activity. Flowsheet Row CARDIAC REHAB PHASE II EXERCISE from 02/01/2023 in [place] CARDIAC REHABILITATION  Date 11/23/22  Educator DF  Instruction Review Code 1- Verbalizes Understanding        Smoking Cessation / COPD -Discuss different methods to quit smoking, the health benefits of quitting smoking, and the definition of COPD. Flowsheet Row CARDIAC REHAB PHASE II EXERCISE from 02/01/2023 in [place] CARDIAC REHABILITATION  Date 11/30/22  Educator HB  Instruction Review Code 1- Verbalizes Understanding       Nutrition I: Fats -Discuss the types of cholesterol, what cholesterol does to the heart, and how cholesterol levels can be controlled. Flowsheet Row CARDIAC REHAB PHASE II EXERCISE from 02/01/2023 in [place] CARDIAC REHABILITATION  Date 12/07/22  Educator DJ  Instruction Review Code 2- Demonstrated Understanding       Nutrition II: Labels -Discuss the different components of food labels and how to read food label Flowsheet Row CARDIAC REHAB PHASE II EXERCISE from 02/01/2023 in [place] [facility]  Date 12/14/22  Educator HB  Instruction Review Code 2- Demonstrated Understanding       Heart Parts/Heart Disease and PAD -Discuss the anatomy of the heart, the pathway  of blood circulation through the heart, and these are affected by heart disease. Flowsheet Row CARDIAC REHAB PHASE II EXERCISE from 02/01/2023 in [place] CARDIAC REHABILITATION  Date 12/21/22  Educator HB  Instruction Review Code 1- Verbalizes Understanding       Stress I: Signs and Symptoms -Discuss the causes of stress, how stress may lead to anxiety and depression, and ways to limit stress. Flowsheet Row CARDIAC REHAB PHASE II EXERCISE from 02/01/2023 in [place] CARDIAC REHABILITATION  Date 12/28/22  Educator HB  Instruction Review Code 1- Verbalizes Understanding       Stress II: Relaxation -Discuss different types of relaxation techniques to limit stress. Flowsheet Row CARDIAC REHAB PHASE II EXERCISE from 02/01/2023 in [place] CARDIAC REHABILITATION  Date 01/04/23  Educator HB  Instruction Review Code 2- Demonstrated Understanding       Warning Signs  of Stroke / TIA -Discuss definition of a stroke, what the signs and symptoms are of a stroke, and how to identify when someone is having stroke. Flowsheet Row CARDIAC REHAB PHASE II EXERCISE from 02/01/2023 in [place] CARDIAC REHABILITATION  Date 01/11/23  Educator HB  Instruction Review Code 1- Verbalizes Understanding       Knowledge Questionnaire Score:  Knowledge Questionnaire Score - 11/15/22 1339       Knowledge Questionnaire Score   Pre Score 23/28             Core Components/Risk Factors/Patient Goals at Admission:  Personal Goals and Risk Factors at Admission - 11/15/22 1353       Core Components/Risk Factors/Patient Goals on Admission   Tobacco Cessation Yes    Number of packs per day 1/4-1/2    Intervention Assist the participant in steps to quit. Provide individualized education and counseling about committing to Tobacco Cessation, relapse prevention, and pharmacological support that can be provided by physician.;Education officer, environmental, assist with locating and accessing local/national Quit Smoking programs, and support quit date choice.    Expected Outcomes Short Term: Will demonstrate readiness to quit, by selecting a quit date.;Short Term: Will quit all tobacco product use, adhering to prevention of relapse plan.;Long Term: Complete abstinence from all tobacco products for at least 12 months from quit date.    Improve shortness of breath with ADL's Yes    Intervention Provide education, individualized exercise plan and daily activity instruction to help decrease symptoms of SOB with activities of daily living.    Expected Outcomes Short Term: Improve cardiorespiratory fitness to achieve a reduction of symptoms when performing ADLs;Long Term: Be able to perform more ADLs without symptoms or delay the onset of symptoms    Hypertension Yes    Intervention Provide education on lifestyle modifcations including regular physical activity/exercise, weight  management, moderate sodium restriction and increased consumption of fresh fruit, vegetables, and low fat dairy, alcohol moderation, and smoking cessation.;Monitor prescription use compliance.    Expected Outcomes Short Term: Continued assessment and intervention until BP is < 140/58mm HG in hypertensive participants. < 130/64mm HG in hypertensive participants with diabetes, heart failure or chronic kidney disease.;Long Term: Maintenance of blood pressure at goal levels.    Lipids Yes    Intervention Provide education and support for participant on nutrition & aerobic/resistive exercise along with prescribed medications to achieve LDL 70mg , HDL >40mg .    Expected Outcomes Short Term: Participant states understanding of desired cholesterol values and is compliant with medications prescribed. Participant is following exercise prescription and nutrition guidelines.;Long Term: Cholesterol controlled with medications as prescribed,  with individualized exercise RX and with personalized nutrition plan. Value goals: LDL < 70mg , HDL > 40 mg.    Personal Goal Other Yes    Personal Goal Improve walking endurance and be able to do more things that he enjoys.    Intervention Attend CR three days per week and begin a home exercise program.    Expected Outcomes Pt will meet stated goal.             Core Components/Risk Factors/Patient Goals Review:   Goals and Risk Factor Review     Row Name 11/28/22 0819 12/26/22 0804 01/23/23 0728         Core Components/Risk Factors/Patient Goals Review   Personal Goals Review Improve shortness of breath with ADL's;Tobacco Cessation;Hypertension;Lipids;Other Improve shortness of breath with ADL's;Tobacco Cessation;Hypertension;Lipids;Other Improve shortness of breath with ADL's;Tobacco Cessation;Hypertension;Lipids;Other     Review Patient was referred to CR with STEMI and stent placement. He has multiple risk factors for CAD and is participating in the program for  risk modification. He has completed 4 sessions with his current weight at 217.6 lbs. His blood pressure is well controlled. He is wearing a nicotine patch to help with smoking cessation and continues to smoke 5-10 cigarettes/day. His personal goals for the program are to improve his walking ability; be able to do more things he enjoys and get healthier. We will continue to monitor his progress as he works towards meeting these goals. Patient has completed 16 sessions with his current weight at 217.0 lbs maintained since last 30 day review. His blood pressure continues to be well controlled. He continues to wear a nicotine patch to help with smoking cessation and continues to smoke 5-10 cigarettes/day. He called Dr. Jacinto Halim' office with complaints of muscle pain and attributed the pain to taking Lipitor. He requested another medication. Dr. Jacinto Halim changed dosage to QOD or take 1/2 tab to see if pain improved. Patient is seeing Dr. Jacinto Halim today. His personal goals for the program continue to be to improve his walking ability; be able to do more things he enjoys and get healthier. We will continue to monitor his progress as he works towards meeting these goals. Patient has completed 26 sessions with his current weight at 217.2 lbs maintained since last 30 day review. He is doing well in the program with consistent attendance and progressions. He says he feels a lot better overall since he started the program. His blood pressure has been trending up he says related to his diet. He admitts to eating salty foods recently.  He continues to wear a nicotine patch to help with smoking cessation but continues to smoke 1 ppd. Patient  saw Dr. Jacinto Halim 3/18 for routine follow up. We sent a progress report. No changes made. Per Dr. Verl Dicker note, patient is smoking 1 ppd cigerattes. His personal goals for the program continue to be to improve his walking ability; be able to do more things he enjoys and get healthier. We will continue to  monitor his progress as he works towards meeting these goals.     Expected Outcomes Patient will complete the program meeting both personal and program goals. Patient will complete the program meeting both personal and program goals. Patient will complete the program meeting both personal and program goals.              Core Components/Risk Factors/Patient Goals at Discharge (Final Review):   Goals and Risk Factor Review - 01/23/23 4098       Core  Components/Risk Factors/Patient Goals Review   Personal Goals Review Improve shortness of breath with ADL's;Tobacco Cessation;Hypertension;Lipids;Other    Review Patient has completed 26 sessions with his current weight at 217.2 lbs maintained since last 30 day review. He is doing well in the program with consistent attendance and progressions. He says he feels a lot better overall since he started the program. His blood pressure has been trending up he says related to his diet. He admitts to eating salty foods recently.  He continues to wear a nicotine patch to help with smoking cessation but continues to smoke 1 ppd. Patient  saw Dr. Jacinto Halim 3/18 for routine follow up. We sent a progress report. No changes made. Per Dr. Verl Dicker note, patient is smoking 1 ppd cigerattes. His personal goals for the program continue to be to improve his walking ability; be able to do more things he enjoys and get healthier. We will continue to monitor his progress as he works towards meeting these goals.    Expected Outcomes Patient will complete the program meeting both personal and program goals.             ITP Comments:   Comments: ITP REVIEW Pt is making expected progress toward Cardiac Rehab goals after completing 30 sessions. Recommend continued exercise, life style modification, education, and increased stamina and strength.

## 2023-02-01 NOTE — Progress Notes (Signed)
Daily Session Note  Patient Details  Name: Leon Mitchell MRN: [id number] Date of Birth: 1956/08/29 Referring Provider:   Flowsheet Row CARDIAC REHAB PHASE II ORIENTATION from 11/15/2022 in [facility] C[facility]IAC RE[facility]  Referring Provider Dr. Jacinto Halim       Encounter Date: 02/01/2023  Check In:  Session Check In - 02/01/23 1109       Check-In   Supervising physician immediately available to respond to emergencies [facility] MD immediately available    Physician(s) Dr. Jenene Slicker    Location [facility]    Staff Present Rodena Medin, RN, BSN;Christy Edwards, RN, BSN;Heather Fredric Mare, BS, Exercise Physiologist    Virtual Visit No    Medication changes reported     No    Fall or balance concerns reported    Yes    Comments He has had several falls this year. 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 He walks with a rollator.    Tobacco Cessation No Change    Warm-up and Cool-down Performed as group-led instruction    Resistance Training Performed Yes    VAD Patient? No    PAD/SET Patient? No      Pain Assessment   Currently in Pain? Yes    Pain Score 5     Pain Location Hip    Pain Orientation Left    Pain Descriptors / Indicators Aching    Pain Type Chronic pain    Pain Radiating Towards Radiating down his leg.    Pain Onset More than a month ago    Pain Frequency Constant    Aggravating Factors  Ambulation    Pain Relieving Factors Tylenol; Tramadol    Effect of Pain on Daily Activities Limits mobility at times    Multiple Pain Sites Yes      3rd Pain Site   Pain Score 5    Pain Location Knee    Pain Descriptors / Indicators Aching    Pain Frequency Constant      Pain Screening   Pain Relieving Factors Tramadol. Tylenol             Capillary Blood Glucose: No results found for this or any previous visit (from the past 24 hour(s)).    Social History   Tobacco Use  Smoking  Status Every Day   Packs/day: 0.50   Years: 40.00   Additional pack years: 0.00   Total pack years: 20.00   Types: Cigarettes   Passive exposure: Never  Smokeless Tobacco Never    Goals Met:  Independence with exercise equipment Exercise tolerated well No report of concerns or symptoms today Strength training completed today  Goals Unmet:  Not Applicable  Comments: Check out 1200.   Dr. Dina Rich is Medical Director for [facility] Cardiac Rehab

## 2023-02-03 ENCOUNTER — Encounter (HOSPITAL_COMMUNITY)
Admission: RE | Admit: 2023-02-03 | Discharge: 2023-02-03 | Disposition: A | Discharge status: Home / Self Care | Payer: Medicare Other | Source: Ambulatory Visit | Attending: Cardiology | Admitting: Cardiology

## 2023-02-03 DIAGNOSIS — Z6830 Body mass index (BMI) 30.0-30.9, adult: Secondary | ICD-10-CM | POA: Diagnosis not present

## 2023-02-03 DIAGNOSIS — I2119 ST elevation (STEMI) myocardial infarction involving other coronary artery of inferior wall: Secondary | ICD-10-CM

## 2023-02-03 DIAGNOSIS — Z955 Presence of coronary angioplasty implant and graft: Secondary | ICD-10-CM | POA: Diagnosis not present

## 2023-02-03 DIAGNOSIS — M419 Scoliosis, unspecified: Secondary | ICD-10-CM | POA: Diagnosis not present

## 2023-02-03 NOTE — Progress Notes (Signed)
Daily Session Note  Patient Details  Name: Leon Mitchell MRN: [id number] Date of Birth: January 14, 1956 Referring Provider:   Flowsheet Row CARDIAC REHAB PHASE II ORIENTATION from 11/15/2022 in [facility] CARDIAC REHABILITATION  Referring Provider Dr. Jacinto Halim       Encounter Date: 02/03/2023  Check In:  Session Check In - 02/03/23 1059       Check-In   Supervising physician immediately available to respond to emergencies [facility] MD immediately available    Physician(s) Dr. Jenene Slicker    Location [facility]    Staff Present Rodena Medin, RN, Pleas Koch, RN, BSN;Hillary Troutman BSN, RN    Virtual Visit No    Medication changes reported     No    Fall or balance concerns reported    Yes    Comments He has had several falls this year. 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 He walks with a rollator.    Tobacco Cessation No Change    Warm-up and Cool-down Performed as group-led instruction    Resistance Training Performed Yes      Pain Assessment   Currently in Pain? No/denies    Pain Score 0-No pain             Capillary Blood Glucose: No results found for this or any previous visit (from the past 24 hour(s)).    Social History   Tobacco Use  Smoking Status Every Day   Packs/day: 0.50   Years: 40.00   Additional pack years: 0.00   Total pack years: 20.00   Types: Cigarettes   Passive exposure: Never  Smokeless Tobacco Never    Goals Met:  Independence with exercise equipment Exercise tolerated well No report of concerns or symptoms today Strength training completed today  Goals Unmet:  Not Applicable  Comments: Checkout at 1200.   Dr. Dina Rich is Medical Director for [facility] Cardiac Rehab

## 2023-02-06 ENCOUNTER — Other Ambulatory Visit: Payer: Self-pay | Admitting: Internal Medicine

## 2023-02-06 ENCOUNTER — Encounter (HOSPITAL_COMMUNITY)
Admission: RE | Admit: 2023-02-06 | Discharge: 2023-02-06 | Disposition: A | Discharge status: Home / Self Care | Payer: Medicare Other | Source: Ambulatory Visit | Attending: Cardiology | Admitting: Cardiology

## 2023-02-06 DIAGNOSIS — I2119 ST elevation (STEMI) myocardial infarction involving other coronary artery of inferior wall: Secondary | ICD-10-CM

## 2023-02-06 DIAGNOSIS — Z955 Presence of coronary angioplasty implant and graft: Secondary | ICD-10-CM | POA: Diagnosis not present

## 2023-02-06 NOTE — Progress Notes (Signed)
Daily Session Note  Patient Details  Name: Leon Mitchell MRN: [id number] Date of Birth: 02-01-1956 Referring Provider:   Flowsheet Row CARDIAC REHAB PHASE II ORIENTATION from 11/15/2022 in [facility] CARDIAC REHABILITATION  Referring Provider Dr. Jacinto Halim       Encounter Date: 02/06/2023  Check In:  Session Check In - 02/06/23 1100       Check-In   Supervising physician immediately available to respond to emergencies [facility] MD immediately available    Physician(s) Dr. Dina Rich    Location [facility]    Staff Present Rodena Medin, RN, Pleas Koch, RN, BSN    Virtual Visit No    Medication changes reported     No    Fall or balance concerns reported    Yes    Comments He has had several falls this year. 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 He walks with a rollator.    Tobacco Cessation No Change    Warm-up and Cool-down Performed as group-led instruction    Resistance Training Performed Yes    VAD Patient? No    PAD/SET Patient? No      Pain Assessment   Currently in Pain? Yes    Pain Score 6     Pain Location Hip    Pain Orientation Left    Pain Descriptors / Indicators Aching;Constant    Pain Type Chronic pain    Pain Radiating Towards Radiating Down his leg.    Pain Onset More than a month ago    Pain Frequency Constant    Aggravating Factors  Ambulation    Pain Relieving Factors Tylenol, Tramadol    Effect of Pain on Daily Activities Limits Mobility at times.    Multiple Pain Sites Yes      4th Pain Site   Pain Score 6    Pain Type Chronic pain    Pain Location Knee    Pain Orientation Right    Pain Descriptors / Indicators Aching;Constant    Pain Frequency Constant      Pain   Pain Onset More than a month ago             Capillary Blood Glucose: No results found for this or any previous visit (from the past 24 hour(s)).    Social History    Tobacco Use  Smoking Status Every Day   Packs/day: 0.50   Years: 40.00   Additional pack years: 0.00   Total pack years: 20.00   Types: Cigarettes   Passive exposure: Never  Smokeless Tobacco Never    Goals Met:  Independence with exercise equipment Exercise tolerated well No report of concerns or symptoms today Strength training completed today  Goals Unmet:  Not Applicable  Comments: Check out 1200.   Dr. Dina Rich is Medical Director for [facility]

## 2023-02-07 ENCOUNTER — Other Ambulatory Visit: Payer: Self-pay | Admitting: Cardiology

## 2023-02-07 DIAGNOSIS — M1611 Unilateral primary osteoarthritis, right hip: Secondary | ICD-10-CM

## 2023-02-07 NOTE — Telephone Encounter (Signed)
Can you refill

## 2023-02-08 ENCOUNTER — Encounter (HOSPITAL_COMMUNITY)
Admission: RE | Admit: 2023-02-08 | Discharge: 2023-02-08 | Disposition: A | Discharge status: Home / Self Care | Payer: Medicare Other | Source: Ambulatory Visit | Attending: Cardiology | Admitting: Cardiology

## 2023-02-08 DIAGNOSIS — Z955 Presence of coronary angioplasty implant and graft: Secondary | ICD-10-CM | POA: Diagnosis not present

## 2023-02-08 DIAGNOSIS — I2119 ST elevation (STEMI) myocardial infarction involving other coronary artery of inferior wall: Secondary | ICD-10-CM | POA: Diagnosis not present

## 2023-02-08 NOTE — Progress Notes (Signed)
Daily Session Note  Patient Details  Name: Leon Mitchell MRN: [id number] Date of Birth: 1956-07-26 Referring Provider:   Flowsheet Row CARDIAC REHAB PHASE II ORIENTATION from 11/15/2022 in [facility] CARDIAC REHABILITATION  Referring Provider Dr. Jacinto Halim       Encounter Date: 02/08/2023  Check In:  Session Check In - 02/08/23 1100       Check-In   Supervising physician immediately available to respond to emergencies [facility] MD immediately available    Physician(s) Dr. Dina Rich    Location [facility]    Staff Present Ross Ludwig, BS, Exercise Physiologist;Oshen Wlodarczyk BSN, RN;Debra Laural Benes, RN, BSN    Virtual Visit No    Medication changes reported     No    Fall or balance concerns reported    Yes    Comments He has had several falls this year. 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 He walks with a rollator.    Tobacco Cessation No Change    Warm-up and Cool-down Performed as group-led instruction    Resistance Training Performed Yes    VAD Patient? No    PAD/SET Patient? No      Pain Assessment   Currently in Pain? Yes    Pain Score 7     Pain Location Hip    Pain Orientation Left    Pain Descriptors / Indicators Aching;Constant    Pain Type Chronic pain    Pain Onset More than a month ago    Pain Frequency Constant    Multiple Pain Sites No             Capillary Blood Glucose: No results found for this or any previous visit (from the past 24 hour(s)).    Social History   Tobacco Use  Smoking Status Every Day   Packs/day: 0.50   Years: 40.00   Additional pack years: 0.00   Total pack years: 20.00   Types: Cigarettes   Passive exposure: Never  Smokeless Tobacco Never    Goals Met:  Independence with exercise equipment Exercise tolerated well No report of concerns or symptoms today Strength training completed today  Goals Unmet:  Not  Applicable  Comments: check out at 12:00   Dr. Dina Rich is Medical Director for [facility] Card[facility]

## 2023-02-10 ENCOUNTER — Encounter (HOSPITAL_COMMUNITY)
Admission: RE | Admit: 2023-02-10 | Discharge: 2023-02-10 | Disposition: A | Discharge status: Home / Self Care | Payer: Medicare Other | Source: Ambulatory Visit | Attending: Cardiology | Admitting: Cardiology

## 2023-02-10 VITALS — Ht 71.0 in | Wt 221.3 lb

## 2023-02-10 DIAGNOSIS — Z955 Presence of coronary angioplasty implant and graft: Secondary | ICD-10-CM | POA: Diagnosis not present

## 2023-02-10 DIAGNOSIS — I2119 ST elevation (STEMI) myocardial infarction involving other coronary artery of inferior wall: Secondary | ICD-10-CM

## 2023-02-10 NOTE — Progress Notes (Signed)
I have reviewed a Home Exercise Prescription with Leon Mitchell . Leon Mitchell is not currently exercising at home.  The patient was advised to walk 3 days a week for 30-45 minutes.  Leon Mitchell and I discussed how to progress their exercise prescription.  The patient stated that their goals were go to the [facility] and keep up with exercising.  The patient stated that they understand the exercise prescription.  We reviewed exercise guidelines, target heart rate during exercise, RPE Scale, weather conditions, NTG use, endpoints for exercise, warmup and cool down.  Patient is encouraged to come to me with any questions. I will continue to follow up with the patient to assist them with progression and safety.

## 2023-02-10 NOTE — Progress Notes (Signed)
Daily Session Note  Patient Details  Name: Leon Mitchell MRN: [id number] Date of Birth: August 11, 1956 Referring Provider:   Flowsheet Row CARDIAC REHAB PHASE II ORIENTATION from 11/15/2022 in [facility] CARDIAC REHABILITATION  Referring Provider Dr. Jacinto Halim       Encounter Date: 02/10/2023  Check In:  Session Check In - 02/10/23 1059       Check-In   Supervising physician immediately available to respond to emergencies [facility] MD immediately available    Physician(s) Dr. Dina Rich    Location [facility]    Staff Present Ross Ludwig, BS, Exercise Physiologist;Debra Laural Benes, RN, Pleas Koch, RN, BSN    Virtual Visit No    Medication changes reported     No    Fall or balance concerns reported    Yes    Comments He has had several falls this year. 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 He walks with a rollator.    Tobacco Cessation No Change    Warm-up and Cool-down Performed as group-led instruction    Resistance Training Performed Yes      Pain Assessment   Currently in Pain? No/denies    Pain Score 0-No pain             Capillary Blood Glucose: No results found for this or any previous visit (from the past 24 hour(s)).    Social History   Tobacco Use  Smoking Status Every Day   Packs/day: 0.50   Years: 40.00   Additional pack years: 0.00   Total pack years: 20.00   Types: Cigarettes   Passive exposure: Never  Smokeless Tobacco Never    Goals Met:  Independence with exercise equipment Exercise tolerated well No report of concerns or symptoms today Strength training completed today  Goals Unmet:  Not Applicable  Comments: Checkout at 1200.   Dr. Dina Rich is Medical Director for [facility]

## 2023-02-13 DIAGNOSIS — F17210 Nicotine dependence, cigarettes, uncomplicated: Secondary | ICD-10-CM | POA: Diagnosis not present

## 2023-02-13 DIAGNOSIS — M25551 Pain in right hip: Secondary | ICD-10-CM | POA: Diagnosis not present

## 2023-02-13 DIAGNOSIS — Z299 Encounter for prophylactic measures, unspecified: Secondary | ICD-10-CM | POA: Diagnosis not present

## 2023-02-13 DIAGNOSIS — Z01818 Encounter for other preprocedural examination: Secondary | ICD-10-CM | POA: Diagnosis not present

## 2023-02-13 DIAGNOSIS — I10 Essential (primary) hypertension: Secondary | ICD-10-CM | POA: Diagnosis not present

## 2023-02-13 DIAGNOSIS — I25119 Atherosclerotic heart disease of native coronary artery with unspecified angina pectoris: Secondary | ICD-10-CM | POA: Diagnosis not present

## 2023-02-13 NOTE — Progress Notes (Signed)
Discharge Progress Report  Patient Details  Name: Leon Mitchell MRN: [id number] Date of Birth: 08/10/1956 Referring Provider:   Flowsheet Row CARDIAC REHAB PHASE II ORIENTATION from 11/15/2022 in [facility] CARD[facility]AC REHABILITATION  Referring Provider Dr. Jacinto Halim        Number of Visits: 66  Reason for Discharge:  Patient reached a stable level of exercise. Patient independent in their exercise. Patient has met program and personal goals.  Smoking History:  Social History   Tobacco Use  Smoking Status Every Day   Packs/day: 0.50   Years: 40.00   Additional pack years: 0.00   Total pack years: 20.00   Types: Cigarettes   Passive exposure: Never  Smokeless Tobacco Never    Diagnosis:  Acute ST elevation myocardial infarction (STEMI) of inferolateral wall (HCC)  Status post coronary artery stent placement  ADL UCSD:   Initial Exercise Prescription:  Initial Exercise Prescription - 11/15/22 1400       Date of Initial Exercise RX and Referring Provider   Date 11/15/22    Referring Provider Dr. Jacinto Halim    Expected Discharge Date 02/10/23      NuStep   Level 1    SPM 60    Minutes 22      Arm Ergometer   Level 1    RPM 45    Minutes 17      Prescription Details   Frequency (times per week) 3    Duration Progress to 30 minutes of continuous aerobic without signs/symptoms of physical distress      Intensity   THRR 40-80% of Max Heartrate 61-122    Ratings of Perceived Exertion 11-13      Resistance Training   Training Prescription Yes    Weight 4    Reps 10-15             Discharge Exercise Prescription (Final Exercise Prescription Changes):  Exercise Prescription Changes - 02/01/23 1259       Home Exercise Plan   Plans to continue exercise at Home (comment)    Frequency Add 2 additional days to program exercise sessions.    Initial Home Exercises Provided 02/01/23             Functional Capacity:  6 Minute Walk     Row Name 11/15/22 1445  02/10/23 1256       6 Minute Walk   Phase Initial Discharge    Distance 650 feet 900 feet    Distance % Change -- 38 %    Distance Feet Change -- 250 ft    Walk Time 6 minutes 6 minutes    # of Rest Breaks 0 0    MPH 1.23 1.7    METS 1.35 1.81    RPE 12 12    VO2 Peak 4.74 6.33    Symptoms Yes (comment) Yes (comment)    Comments Arms fatigued due to holding himself up Arms fatigued due to holding himself up    Resting HR 50 bpm 51 bpm    Resting BP 124/60 126/72    Resting Oxygen Saturation  97 % 98 %    Exercise Oxygen Saturation  during 6 min walk 99 % 96 %    Max Ex. HR 60 bpm 60 bpm    Max Ex. BP 128/64 140/72    2 Minute Post BP 118/60 130/70             Psychological, QOL, Others - Outcomes: PHQ 2/9:  11/15/2022    1:25 PM  Depression screen PHQ 2/9  Decreased Interest 3  Down, Depressed, Hopeless 1  PHQ - 2 Score 4  Altered sleeping 1  Tired, decreased energy 3  Change in appetite 1  Feeling bad or failure about yourself  0  Trouble concentrating 1  Moving slowly or fidgety/restless 0  Suicidal thoughts 0  PHQ-9 Score 10  Difficult doing work/chores Somewhat difficult    Quality of Life:  Quality of Life - 02/13/23 0909       Quality of Life Scores   Health/Function Pre 15.97 %    Health/Function Post 19.1 %    Health/Function % Change 19.6 %    Socioeconomic Pre 22.08 %    Socioeconomic Post 24.33 %    Socioeconomic % Change  10.19 %    Psych/Spiritual Pre 19.21 %    Psych/Spiritual Post 22.36 %    Psych/Spiritual % Change 16.4 %    Family Pre 28.3 %    Family Post 25.9 %    Family % Change -8.48 %    GLOBAL Pre 19.64 %    GLOBAL Post 21.77 %    GLOBAL % Change 10.85 %             Personal Goals: Goals established at orientation with interventions provided to work toward goal.  Personal Goals and Risk Factors at Admission - 11/15/22 1353       Core Components/Risk Factors/Patient Goals on Admission   Tobacco Cessation Yes     Number of packs per day 1/4-1/2    Intervention Assist the participant in steps to quit. Provide individualized education and counseling about committing to Tobacco Cessation, relapse prevention, and pharmacological support that can be provided by physician.;Education officer, environmental, assist with locating and accessing local/national Quit Smoking programs, and support quit date choice.    Expected Outcomes Short Term: Will demonstrate readiness to quit, by selecting a quit date.;Short Term: Will quit all tobacco product use, adhering to prevention of relapse plan.;Long Term: Complete abstinence from all tobacco products for at least 12 months from quit date.    Improve shortness of breath with ADL's Yes    Intervention Provide education, individualized exercise plan and daily activity instruction to help decrease symptoms of SOB with activities of daily living.    Expected Outcomes Short Term: Improve cardiorespiratory fitness to achieve a reduction of symptoms when performing ADLs;Long Term: Be able to perform more ADLs without symptoms or delay the onset of symptoms    Hypertension Yes    Intervention Provide education on lifestyle modifcations including regular physical activity/exercise, weight management, moderate sodium restriction and increased consumption of fresh fruit, vegetables, and low fat dairy, alcohol moderation, and smoking cessation.;Monitor prescription use compliance.    Expected Outcomes Short Term: Continued assessment and intervention until BP is < 140/65mm HG in hypertensive participants. < 130/33mm HG in hypertensive participants with diabetes, heart failure or chronic kidney disease.;Long Term: Maintenance of blood pressure at goal levels.    Lipids Yes    Intervention Provide education and support for participant on nutrition & aerobic/resistive exercise along with prescribed medications to achieve LDL 70mg , HDL >40mg .    Expected Outcomes Short Term: Participant states  understanding of desired cholesterol values and is compliant with medications prescribed. Participant is following exercise prescription and nutrition guidelines.;Long Term: Cholesterol controlled with medications as prescribed, with individualized exercise RX and with personalized nutrition plan. Value goals: LDL < 70mg , HDL > 40 mg.  Personal Goal Other Yes    Personal Goal Improve walking endurance and be able to do more things that he enjoys.    Intervention Attend CR three days per week and begin a home exercise program.    Expected Outcomes Pt will meet stated goal.              Personal Goals Discharge:  Goals and Risk Factor Review     Row Name 11/28/22 0819 12/26/22 0804 01/23/23 0728 02/13/23 0912       Core Components/Risk Factors/Patient Goals Review   Personal Goals Review Improve shortness of breath with ADL's;Tobacco Cessation;Hypertension;Lipids;Other Improve shortness of breath with ADL's;Tobacco Cessation;Hypertension;Lipids;Other Improve shortness of breath with ADL's;Tobacco Cessation;Hypertension;Lipids;Other Improve shortness of breath with ADL's;Tobacco Cessation;Hypertension;Lipids;Other    Review Patient was referred to CR with STEMI and stent placement. He has multiple risk factors for CAD and is participating in the program for risk modification. He has completed 4 sessions with his current weight at 217.6 lbs. His blood pressure is well controlled. He is wearing a nicotine patch to help with smoking cessation and continues to smoke 5-10 cigarettes/day. His personal goals for the program are to improve his walking ability; be able to do more things he enjoys and get healthier. We will continue to monitor his progress as he works towards meeting these goals. Patient has completed 16 sessions with his current weight at 217.0 lbs maintained since last 30 day review. His blood pressure continues to be well controlled. He continues to wear a nicotine patch to help with  smoking cessation and continues to smoke 5-10 cigarettes/day. He called Dr. Jacinto Halim' office with complaints of muscle pain and attributed the pain to taking Lipitor. He requested another medication. Dr. Jacinto Halim changed dosage to QOD or take 1/2 tab to see if pain improved. Patient is seeing Dr. Jacinto Halim today. His personal goals for the program continue to be to improve his walking ability; be able to do more things he enjoys and get healthier. We will continue to monitor his progress as he works towards meeting these goals. Patient has completed 26 sessions with his current weight at 217.2 lbs maintained since last 30 day review. He is doing well in the program with consistent attendance and progressions. He says he feels a lot better overall since he started the program. His blood pressure has been trending up he says related to his diet. He admitts to eating salty foods recently.  He continues to wear a nicotine patch to help with smoking cessation but continues to smoke 1 ppd. Patient  saw Dr. Jacinto Halim 3/18 for routine follow up. We sent a progress report. No changes made. Per Dr. Verl Dicker note, patient is smoking 1 ppd cigerattes. His personal goals for the program continue to be to improve his walking ability; be able to do more things he enjoys and get healthier. We will continue to monitor his progress as he works towards meeting these goals. Patient graduated from the program with 34 sessions. His exit walk test improved by 38%. His discharge wegith was 217.6% gaining 3.2 lbs overall. His blood pressure was at goal. 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 He did meet some of his personal goals. He does continue to smoke 1 PPD.    Expected Outcomes Patient will complete the program meeting both personal and program goals. Patient will  complete the program meeting both personal and  program goals. Patient will complete the program meeting both personal and program goals. Patient will continue to exercise after his orthopedic surgeries and continue to work toward meeting his personal goals.             Exercise Goals and Review:  Exercise Goals     Row Name 11/15/22 1447 12/05/22 1416 01/02/23 1453 01/30/23 1352       Exercise Goals   Increase Physical Activity Yes Yes Yes Yes    Intervention Provide advice, education, support and counseling about physical activity/exercise needs.;Develop an individualized exercise prescription for aerobic and resistive training based on initial evaluation findings, risk stratification, comorbidities and participant's personal goals. Provide advice, education, support and counseling about physical activity/exercise needs.;Develop an individualized exercise prescription for aerobic and resistive training based on initial evaluation findings, risk stratification, comorbidities and participant's personal goals. Provide advice, education, support and counseling about physical activity/exercise needs.;Develop an individualized exercise prescription for aerobic and resistive training based on initial evaluation findings, risk stratification, comorbidities and participant's personal goals. Provide advice, education, support and counseling about physical activity/exercise needs.;Develop an individualized exercise prescription for aerobic and resistive training based on initial evaluation findings, risk stratification, comorbidities and participant's personal goals.    Expected Outcomes Short Term: Attend rehab on a regular basis to increase amount of physical activity.;Long Term: Add in home exercise to make exercise part of routine and to increase amount of physical activity.;Long Term: Exercising regularly at least 3-5 days a week. Short Term: Attend rehab on a regular basis to increase amount of physical activity.;Long Term: Add in home exercise to  make exercise part of routine and to increase amount of physical activity.;Long Term: Exercising regularly at least 3-5 days a week. Short Term: Attend rehab on a regular basis to increase amount of physical activity.;Long Term: Add in home exercise to make exercise part of routine and to increase amount of physical activity.;Long Term: Exercising regularly at least 3-5 days a week. Short Term: Attend rehab on a regular basis to increase amount of physical activity.;Long Term: Add in home exercise to make exercise part of routine and to increase amount of physical activity.;Long Term: Exercising regularly at least 3-5 days a week.    Increase Strength and Stamina Yes Yes Yes Yes    Intervention Provide advice, education, support and counseling about physical activity/exercise needs.;Develop an individualized exercise prescription for aerobic and resistive training based on initial evaluation findings, risk stratification, comorbidities and participant's personal goals. Provide advice, education, support and counseling about physical activity/exercise needs.;Develop an individualized exercise prescription for aerobic and resistive training based on initial evaluation findings, risk stratification, comorbidities and participant's personal goals. Provide advice, education, support and counseling about physical activity/exercise needs.;Develop an individualized exercise prescription for aerobic and resistive training based on initial evaluation findings, risk stratification, comorbidities and participant's personal goals. Provide advice, education, support and counseling about physical activity/exercise needs.;Develop an individualized exercise prescription for aerobic and resistive training based on initial evaluation findings, risk stratification, comorbidities and participant's personal goals.    Expected Outcomes Short Term: Increase workloads from initial exercise prescription for resistance, speed, and  METs.;Short Term: Perform resistance training exercises routinely during rehab and add in resistance training at home;Long Term: Improve cardiorespiratory fitness, muscular endurance and strength as measured by increased METs and functional capacity ( ) Short Term: Increase workloads from initial exercise prescription for resistance, speed, and METs.;Short Term: Perform resistance training exercises routinely during rehab and add  in resistance training at home;Long Term: Improve cardiorespiratory fitness, muscular endurance and strength as measured by increased METs and functional capacity ( ) Short Term: Increase workloads from initial exercise prescription for resistance, speed, and METs.;Short Term: Perform resistance training exercises routinely during rehab and add in resistance training at home;Long Term: Improve cardiorespiratory fitness, muscular endurance and strength as measured by increased METs and functional capacity ( ) Short Term: Increase workloads from initial exercise prescription for resistance, speed, and METs.;Short Term: Perform resistance training exercises routinely during rehab and add in resistance training at home;Long Term: Improve cardiorespiratory fitness, muscular endurance and strength as measured by increased METs and functional capacity ( )    Able to understand and use rate of perceived exertion (RPE) scale Yes Yes Yes Yes    Intervention Provide education and explanation on how to use RPE scale Provide education and explanation on how to use RPE scale Provide education and explanation on how to use RPE scale Provide education and explanation on how to use RPE scale    Expected Outcomes Short Term: Able to use RPE daily in rehab to express subjective intensity level;Long Term:  Able to use RPE to guide intensity level when exercising independently Short Term: Able to use RPE daily in rehab to express subjective intensity level;Long Term:  Able to use RPE to guide  intensity level when exercising independently Short Term: Able to use RPE daily in rehab to express subjective intensity level;Long Term:  Able to use RPE to guide intensity level when exercising independently Short Term: Able to use RPE daily in rehab to express subjective intensity level;Long Term:  Able to use RPE to guide intensity level when exercising independently    Knowledge and understanding of Target Heart Rate Range (THRR) Yes Yes Yes Yes    Intervention Provide education and explanation of THRR including how the numbers were predicted and where they are located for reference Provide education and explanation of THRR including how the numbers were predicted and where they are located for reference Provide education and explanation of THRR including how the numbers were predicted and where they are located for reference Provide education and explanation of THRR including how the numbers were predicted and where they are located for reference    Expected Outcomes Short Term: Able to state/look up THRR;Long Term: Able to use THRR to govern intensity when exercising independently;Short Term: Able to use daily as guideline for intensity in rehab Short Term: Able to state/look up THRR;Long Term: Able to use THRR to govern intensity when exercising independently;Short Term: Able to use daily as guideline for intensity in rehab Short Term: Able to state/look up THRR;Long Term: Able to use THRR to govern intensity when exercising independently;Short Term: Able to use daily as guideline for intensity in rehab Short Term: Able to state/look up THRR;Long Term: Able to use THRR to govern intensity when exercising independently;Short Term: Able to use daily as guideline for intensity in rehab    Able to check pulse independently Yes -- Yes Yes    Intervention Provide education and demonstration on how to check pulse in carotid and radial arteries.;Review the importance of being able to check your own pulse for  safety during independent exercise Provide education and demonstration on how to check pulse in carotid and radial arteries.;Review the importance of being able to check your own pulse for safety during independent exercise Provide education and demonstration on how to check pulse in carotid and radial arteries.;Review the importance of being able to  check your own pulse for safety during independent exercise Provide education and demonstration on how to check pulse in carotid and radial arteries.;Review the importance of being able to check your own pulse for safety during independent exercise    Expected Outcomes Short Term: Able to explain why pulse checking is important during independent exercise;Long Term: Able to check pulse independently and accurately Short Term: Able to explain why pulse checking is important during independent exercise;Long Term: Able to check pulse independently and accurately Short Term: Able to explain why pulse checking is important during independent exercise;Long Term: Able to check pulse independently and accurately Short Term: Able to explain why pulse checking is important during independent exercise;Long Term: Able to check pulse independently and accurately    Understanding of Exercise Prescription Yes Yes Yes Yes    Intervention Provide education, explanation, and written materials on patient's individual exercise prescription Provide education, explanation, and written materials on patient's individual exercise prescription Provide education, explanation, and written materials on patient's individual exercise prescription Provide education, explanation, and written materials on patient's individual exercise prescription    Expected Outcomes Short Term: Able to explain program exercise prescription;Long Term: Able to explain home exercise prescription to exercise independently Short Term: Able to explain program exercise prescription;Long Term: Able to explain home  exercise prescription to exercise independently Short Term: Able to explain program exercise prescription;Long Term: Able to explain home exercise prescription to exercise independently Short Term: Able to explain program exercise prescription;Long Term: Able to explain home exercise prescription to exercise independently             Exercise Goals Re-Evaluation:  Exercise Goals Re-Evaluation     Row Name 12/05/22 1417 01/02/23 1454 01/30/23 1352 01/30/23 1409       Exercise Goal Re-Evaluation   Exercise Goals Review Increase Physical Activity;Increase Strength and Stamina;Able to understand and use rate of perceived exertion (RPE) scale;Knowledge and understanding of Target Heart Rate Range (THRR);Able to check pulse independently;Understanding of Exercise Prescription Increase Physical Activity;Increase Strength and Stamina;Able to understand and use rate of perceived exertion (RPE) scale;Knowledge and understanding of Target Heart Rate Range (THRR);Able to check pulse independently;Understanding of Exercise Prescription Increase Physical Activity;Increase Strength and Stamina;Able to understand and use rate of perceived exertion (RPE) scale;Knowledge and understanding of Target Heart Rate Range (THRR);Able to check pulse independently;Understanding of Exercise Prescription (P)  Increase Physical Activity;Increase Strength and Stamina;Able to understand and use rate of perceived exertion (RPE) scale;Knowledge and understanding of Target Heart Rate Range (THRR);Able to check pulse independently;Understanding of Exercise Prescription    Comments Pt has completed 8 sessions of cardiac rehab. He is deconditioned due to hip and knee pain and but is starting to progress. He uses his rollator during warm uop for stability. He is curretnly exercising at 2.07 METs on the arm ergometer. Will continue to montior and progress as able. Pt has completed 19 sessions of cardiac rehab. He continues to have hip and  knee pain but is not affecting his progression, he continue to increase his workload on the stepper. He is currently exercising at 2.49 METs on the arm ergometer. Will continue to monitor and progress as able. -- Pt has completed 29 sessions of cardiac rehab. He continues to have hip and knee pain that affects his progression. He is currently exercising at 3.13 METs on the steppr. Will continue to monitor and progress as able.    Expected Outcomes Through exercise at rehab and home, the patient will meet thier stated goals.  Through exercise at rehab and home, the patient will meet thier stated goals. Through exercise at rehab and home, the patient will meet thier stated goals. (P)  Through exercise at rehab and home, the patient will meet thier stated goals.             Nutrition & Weight - Outcomes:  Pre Biometrics - 11/15/22 1448       Pre Biometrics   Height 5\' 11"  (1.803 m)    Weight 98.6 kg    Waist Circumference 41 inches    Hip Circumference 39 inches    Waist to Hip Ratio 1.05 %    BMI (Calculated) 30.33    Triceps Skinfold 8 mm    % Body Fat 26.1 %    Grip Strength 29.4 kg    Flexibility 0 in    Single Leg Stand 0 seconds             Post Biometrics - 02/10/23 1257        Post  Biometrics   Height 5\' 11"  (1.803 m)    Weight 100.4 kg    Waist Circumference 42 inches    Hip Circumference 39 inches    Waist to Hip Ratio 1.08 %    BMI (Calculated) 30.88    Triceps Skinfold 15 mm    % Body Fat 29.3 %    Grip Strength 31 kg    Flexibility 0 in    Single Leg Stand 0 seconds             Nutrition:  Nutrition Therapy & Goals - 11/16/22 0653       Personal Nutrition Goals   Comments Patient's diet assessment score was 50. We offer educational sessions on heart healthy nutrition with handouts and assistance with RD referral if patient is interested.      Intervention Plan   Intervention Nutrition handout(s) given to patient.    Expected Outcomes Short Term  Goal: Understand basic principles of dietary content, such as calories, fat, sodium, cholesterol and nutrients.             Nutrition Discharge:  Nutrition Assessments - 11/15/22 1337       MEDFICTS Scores   Pre Score 50             Education Questionnaire Score:  Knowledge Questionnaire Score - 02/13/23 0748       Knowledge Questionnaire Score   Post Score 24/28            Patient graduated from the program with 34 sessions. His exit walk test improved by 38%. His discharge weigth was 217.6% gaining 3.2 lbs overall. His blood pressure was at goal. 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 He did meet some of his personal goals. He does continue to smoke 1 PPD.  Goals reviewed with patient; copy given to patient.

## 2023-02-22 ENCOUNTER — Ambulatory Visit (HOSPITAL_COMMUNITY)
Admission: RE | Admit: 2023-02-22 | Discharge: 2023-02-22 | Disposition: A | Discharge status: Home / Self Care | Payer: Medicare Other | Source: Ambulatory Visit | Attending: Vascular Surgery | Admitting: Vascular Surgery

## 2023-02-22 ENCOUNTER — Ambulatory Visit (INDEPENDENT_AMBULATORY_CARE_PROVIDER_SITE_OTHER): Payer: Medicare Other | Admitting: Physician Assistant

## 2023-02-22 VITALS — BP 129/82 | HR 56 | Temp 98.0°F | Wt 221.0 lb

## 2023-02-22 DIAGNOSIS — R911 Solitary pulmonary nodule: Secondary | ICD-10-CM | POA: Insufficient documentation

## 2023-02-22 DIAGNOSIS — Z87891 Personal history of nicotine dependence: Secondary | ICD-10-CM | POA: Diagnosis not present

## 2023-02-22 DIAGNOSIS — I6523 Occlusion and stenosis of bilateral carotid arteries: Secondary | ICD-10-CM | POA: Diagnosis not present

## 2023-02-22 NOTE — Progress Notes (Unsigned)
Office Note   History of Present Illness   Leon Mitchell is a 67 y.o. (Sep 28, 1956) male who presents for follow-up of carotid artery stenosis. He has a history of left carotid endarterectomy done by Dr.Cain on 04/19/2022 for asymptomatic critical stenosis. He has no history of CVA or TIA.  He returns today for follow-up.  He denies any symptoms of stroke such as slurred speech, facial droop, sudden weakness/numbness, or sudden visual changes.  He takes aspirin and Zetia daily.  Current Outpatient Medications  Medication Sig Dispense Refill   aspirin EC 81 MG tablet Take 81 mg by mouth in the morning. Swallow whole.     cyclobenzaprine (FLEXERIL) 10 MG tablet Take 1 tablet (10 mg total) by mouth 3 (three) times daily as needed for muscle spasms. 30 tablet 0   diclofenac (VOLTAREN) 75 MG EC tablet Take 75 mg by mouth 2 (two) times daily as needed for moderate pain.     ezetimibe (ZETIA) 10 MG tablet Take 1 tablet (10 mg total) by mouth daily. 90 tablet 1   gabapentin (NEURONTIN) 300 MG capsule Take 600 mg by mouth 3 (three) times daily.     HYDROcodone-acetaminophen (NORCO/VICODIN) 5-325 MG tablet Take 1 tablet by mouth 3 (three) times daily as needed for moderate pain.     lisinopril (ZESTRIL) 20 MG tablet Take 1 tablet (20 mg total) by mouth daily. Pt needs to schedule appt with provider for further refills - 1st attempt 30 tablet 0   metoprolol succinate (TOPROL-XL) 25 MG 24 hr tablet Take 1 tablet (25 mg total) by mouth daily. 90 tablet 1   nitroGLYCERIN (NITROSTAT) 0.4 MG SL tablet Place 1 tablet (0.4 mg total) under the tongue every 5 (five) minutes as needed for up to 25 days for chest pain. 25 tablet 3   prasugrel (EFFIENT) 10 MG TABS tablet Take 1 tablet (10 mg total) by mouth daily. 90 tablet 3   traMADol (ULTRAM) 50 MG tablet TAKE 1 TABLET BY MOUTH TWICE DAILY AS NEEDED 60 tablet 1   Vitamin D, Ergocalciferol, (DRISDOL) 1.25 MG (50000 UNIT) CAPS capsule Take 50,000 Units by mouth  every Sunday.     No current facility-administered medications for this visit.    REVIEW OF SYSTEMS (negative unless checked):   Cardiac:  []  Chest pain or chest pressure? []  Shortness of breath upon activity? []  Shortness of breath when lying flat? []  Irregular heart rhythm?  Vascular:  []  Pain in calf, thigh, or hip brought on by walking? []  Pain in feet at night that wakes you up from your sleep? []  Blood clot in your veins? []  Leg swelling?  Pulmonary:  []  Oxygen at home? []  Productive cough? []  Wheezing?  Neurologic:  []  Sudden weakness in arms or legs? []  Sudden numbness in arms or legs? []  Sudden onset of difficult speaking or slurred speech? []  Temporary loss of vision in one eye? []  Problems with dizziness?  Gastrointestinal:  []  Blood in stool? []  Vomited blood?  Genitourinary:  []  Burning when urinating? []  Blood in urine?  Psychiatric:  []  Major depression  Hematologic:  []  Bleeding problems? []  Problems with blood clotting?  Dermatologic:  []  Rashes or ulcers?  Constitutional:  []  Fever or chills?  Ear/Nose/Throat:  []  Change in hearing? []  Nose bleeds? []  Sore throat?  Musculoskeletal:  []  Back pain? []  Joint pain? []  Muscle pain?   Physical Examination   Vitals:   02/22/23 1357 02/22/23 1400  BP: 119/76 129/82  Pulse: (!) 56   Temp: 98 F (36.7 C)   TempSrc: Temporal   SpO2: 97%   Weight: 221 lb (100.2 kg)    Body mass index is 30.82 kg/m.  General:  WDWN in NAD; vital signs documented above Gait: Not observed HENT: WNL, normocephalic Pulmonary: normal non-labored breathing  Cardiac: Regular Abdomen: soft, NT, no masses Skin: without rashes Vascular Exam/Pulses: Palpable radial pulses bilaterally Extremities: without ischemic changes, without gangrene , without cellulitis; without open wounds;  Musculoskeletal: no muscle wasting or atrophy  Neurologic: A&O X 3;  No focal weakness or paresthesias are  detected Psychiatric:  The pt has Normal affect.  Non-Invasive Vascular Imaging   B Carotid Duplex (02/22/2023):  R ICA stenosis:  1-39% R VA:  patent and antegrade L ICA stenosis:  1-39% L VA:  patent and antegrade   Medical Decision Making   Leon Mitchell is a 67 y.o. male who presents for surveillance of carotid artery stenosis  Based on the patient's vascular studies, his carotid artery stenosis is stable at 1 to 39% bilaterally.  His left carotid endarterectomy site is patent He denies any strokelike symptoms such as slurred speech, facial droop, sudden visual changes, or sudden weakness/numbness He has no neurodeficits on exam.  He has palpable and equal radial pulses He can continue his aspirin and Zetia daily.  He will follow-up with our office in 1 year with repeat carotid duplex   Loel Dubonnet PA-C [facility] Office: [phone]  Clinic MD: Randie Heinz

## 2023-02-23 ENCOUNTER — Ambulatory Visit (HOSPITAL_COMMUNITY)
Admission: RE | Admit: 2023-02-23 | Discharge: 2023-02-23 | Disposition: A | Discharge status: Home / Self Care | Payer: Medicare Other | Source: Ambulatory Visit | Attending: Acute Care | Admitting: Acute Care

## 2023-02-23 DIAGNOSIS — F17210 Nicotine dependence, cigarettes, uncomplicated: Secondary | ICD-10-CM | POA: Diagnosis not present

## 2023-02-23 DIAGNOSIS — Z87891 Personal history of nicotine dependence: Secondary | ICD-10-CM

## 2023-02-23 DIAGNOSIS — R911 Solitary pulmonary nodule: Secondary | ICD-10-CM

## 2023-02-23 DIAGNOSIS — I6523 Occlusion and stenosis of bilateral carotid arteries: Secondary | ICD-10-CM | POA: Diagnosis not present

## 2023-02-28 ENCOUNTER — Telehealth: Payer: Self-pay | Admitting: Acute Care

## 2023-02-28 ENCOUNTER — Other Ambulatory Visit: Payer: Self-pay

## 2023-02-28 DIAGNOSIS — Z87891 Personal history of nicotine dependence: Secondary | ICD-10-CM

## 2023-02-28 DIAGNOSIS — R911 Solitary pulmonary nodule: Secondary | ICD-10-CM

## 2023-02-28 NOTE — Telephone Encounter (Signed)
Contacted patient by phone, using two patient identifiers, to review results of LDCT.  Nodule of concern has decreased in size. Now measuring 13.6 mm.  Recommendation to review nodule again with repeat LDCT in 6 months to follow changes as precaution.  Likely benign due to patient had recent pneumonia but advised to repeat scan due to size of nodule.  Patient in  agreement with plan. Will contact closer to due date for scheduling. Patient had no questions and had reviewed results on mychart.  New order placed for LDCT follow up in 6 months and PCP faxed results/plan.

## 2023-03-01 ENCOUNTER — Other Ambulatory Visit: Payer: Self-pay

## 2023-03-01 MED ORDER — NITROGLYCERIN 0.4 MG SL SUBL: 0.4000 mg | SUBLINGUAL_TABLET | SUBLINGUAL | 3 refills | Status: AC | PRN

## 2023-03-02 ENCOUNTER — Other Ambulatory Visit: Payer: Self-pay

## 2023-03-02 DIAGNOSIS — I6523 Occlusion and stenosis of bilateral carotid arteries: Secondary | ICD-10-CM

## 2023-03-08 ENCOUNTER — Other Ambulatory Visit: Payer: Self-pay | Admitting: Internal Medicine

## 2023-03-08 ENCOUNTER — Encounter (HOSPITAL_COMMUNITY): Payer: Self-pay

## 2023-03-13 NOTE — H&P (Signed)
TOTAL HIP ADMISSION H&P  Patient is admitted for right total hip arthroplasty.  Subjective:  Chief Complaint: Right hip pain  HPI: Leon Mitchell, 67 y.o. male, has a history of pain and functional disability in the right hip due to arthritis and patient has failed non-surgical conservative treatments for greater than 12 weeks to include NSAID's and/or analgesics, corticosteriod injections, use of assistive devices, and activity modification. Onset of symptoms was gradual, starting  several  years ago with gradually worsening course since that time. The patient noted no past surgery on the right hip. Patient currently rates pain in the right hip at 8 out of 10 with activity. Patient has night pain, worsening of pain with activity and weight bearing, and trendelenberg gait. 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 This condition presents safety issues increasing the risk of falls. There is no current active infection.  Patient Active Problem List   Diagnosis Date Noted   Coronary artery disease involving native coronary artery of native heart without angina pectoris 10/27/2022   Acute ST elevation myocardial infarction (STEMI) of inferolateral wall (HCC) 10/19/2022   Pure hypercholesterolemia 10/19/2022   Tobacco use disorder 10/19/2022   History of left-sided carotid endarterectomy 10/19/2022   Spondylolisthesis of lumbar region 06/20/2022   Carotid artery stenosis 04/19/2022   COPD GOLD 0  / active smoker  06/17/2020   Solitary pulmonary nodule on lung CT 06/17/2020   Cigarette smoker 06/17/2020   Primary hypertension 06/17/2020   Hoffman's reflex 08/17/2017   Hyperreflexia 08/17/2017   Thoracic spondylosis with myelopathy 01/18/2016   B12 deficiency 01/18/2016   Vitamin D deficiency 01/18/2016   OA (osteoarthritis) of knee 06/05/2013   Right knee pain 06/05/2013   OA  (osteoarthritis) of foot 06/05/2013    Past Medical History:  Diagnosis Date   Bulging lumbar disc    GERD (gastroesophageal reflux disease)    History of kidney stones    Hypertension    Pneumonia    May 2023    Past Surgical History:  Procedure Laterality Date   APPENDECTOMY     BACK SURGERY  01/12/2016   T 5&6   BACK SURGERY  04/2016   T 6-9   BACK SURGERY     Feb 2022   COLONOSCOPY N/A 01/06/2016   Procedure: COLONOSCOPY;  Surgeon: Malissa Hippo, MD;  Location: [facility];  Service: Endoscopy;  Laterality: N/A;  730   CORONARY THROMBECTOMY N/A 10/19/2022   Procedure: Coronary Thrombectomy;  Surgeon: Yates Decamp, MD;  Location: [facility] INVASIVE CV LAB;  Service: Cardiovascular;  Laterality: N/A;   CORONARY/GRAFT ACUTE MI REVASCULARIZATION N/A 10/19/2022   Procedure: Coronary/Graft Acute MI Revascularization;  Surgeon: Yates Decamp, MD;  Location: [facility] INVASIVE CV LAB;  Service: Cardiovascular;  Laterality: N/A;   ENDARTERECTOMY Left 04/19/2022   Procedure: LEFT CAROTID ENDARTERECTOMY;  Surgeon: Maeola Harman, MD;  Location: [facility] OR;  Service: Vascular;  Laterality: Left;   HAND SURGERY Left    KNEE ARTHROSCOPY Left 2006   LEFT HEART CATH AND CORONARY ANGIOGRAPHY N/A 10/19/2022   Procedure: LEFT HEART CATH AND CORONARY ANGIOGRAPHY;  Surgeon: Yates Decamp, MD;  Location: [facility] INVASIVE CV LAB;  Service: Cardiovascular;  Laterality: N/A;   PATCH ANGIOPLASTY Left 04/19/2022   Procedure: PATCH ANGIOPLASTY using Livia Snellen Biologic Patch;  Surgeon: Maeola Harman, MD;  Location: [facility] OR;  Service: Vascular;  Laterality: Left;  TRANSFORAMINAL LUMBAR INTERBODY FUSION W/ MIS 2 LEVEL N/A 06/20/2022   Procedure: Lumbar three-four, Lumbar four-five Laminectomies/resection of synovial cyst Minimally Invasive Surgery ,Transforaminal Lumbar interbody Fusion lumbar three-four lumbar four-five;  Surgeon: Jadene Pierini, MD;  Location: [facility] OR;  Service: Neurosurgery;  Laterality: N/A;     Prior to Admission medications   Medication Sig Start Date End Date Taking? Authorizing Provider  aspirin EC 81 MG tablet Take 81 mg by mouth in the morning. Swallow whole.    [provider]  cyclobenzaprine (FLEXERIL) 10 MG tablet Take 1 tablet (10 mg total) by mouth 3 (three) times daily as needed for muscle spasms. 06/21/22   Jadene Pierini, MD  diclofenac (VOLTAREN) 75 MG EC tablet Take 75 mg by mouth 2 (two) times daily as needed for moderate pain.    [provider]  ezetimibe (ZETIA) 10 MG tablet Take 1 tablet (10 mg total) by mouth daily. 01/05/23 07/04/23  Yates Decamp, MD  gabapentin (NEURONTIN) 300 MG capsule Take 600 mg by mouth 3 (three) times daily.    [provider]  HYDROcodone-acetaminophen (NORCO/VICODIN) 5-325 MG tablet Take 1 tablet by mouth 3 (three) times daily as needed for moderate pain. 11/09/22   [provider]  lisinopril (ZESTRIL) 20 MG tablet TAKE 1 TABLET BY MOUTH DAILY 03/08/23   Maisie Fus, MD  metoprolol succinate (TOPROL-XL) 25 MG 24 hr tablet Take 1 tablet (25 mg total) by mouth daily. 01/05/23   Yates Decamp, MD  nitroGLYCERIN (NITROSTAT) 0.4 MG SL tablet Place 1 tablet (0.4 mg total) under the tongue every 5 (five) minutes as needed for up to 25 days for chest pain. 03/01/23 03/26/23  Yates Decamp, MD  prasugrel (EFFIENT) 10 MG TABS tablet Take 1 tablet (10 mg total) by mouth daily. 11/24/22 11/19/23  Yates Decamp, MD  traMADol (ULTRAM) 50 MG tablet TAKE 1 TABLET BY MOUTH TWICE DAILY AS NEEDED 02/07/23   Yates Decamp, MD  Vitamin D, Ergocalciferol, (DRISDOL) 1.25 MG (50000 UNIT) CAPS capsule Take 50,000 Units by mouth every Sunday. 05/12/20   [provider]    Allergies  Allergen Reactions   Rosuvastatin Other (See Comments)    Severe myalgia    Social History   Socioeconomic History   Marital status: Widowed    Spouse name: Not on file   Number of children: 2   Years of education: 33   Highest education level:  Not on file  Occupational History    Comment: Games developer, disability  Tobacco Use   Smoking status: Every Day    Packs/day: 0.50    Years: 40.00    Additional pack years: 0.00    Total pack years: 20.00    Types: Cigarettes    Passive exposure: Never   Smokeless tobacco: Never  Vaping Use   Vaping Use: Never used  Substance and Sexual Activity   Alcohol use: Yes    Comment: very little; occasionally   Drug use: No   Sexual activity: Not on file  Other Topics Concern   Not on file  Social History Narrative   Mother lives with patient   Caffeine use- 3-4 glasses tea   Social Determinants of Health   Financial Resource Strain: Not on file  Food Insecurity: No Food Insecurity (10/19/2022)   Hunger Vital Sign    Worried About Running Out of Food in the Last Year: Never true    Ran Out of Food in the Last Year: Never true  Transportation  Needs: No Transportation Needs (10/19/2022)   PRAPARE - Administrator, Civil Service (Medical): No    Lack of Transportation (Non-Medical): No  Physical Activity: Not on file  Stress: Not on file  Social Connections: Not on file  Intimate Partner Violence: Not At Risk (10/19/2022)   Humiliation, Afraid, Rape, and Kick questionnaire    Fear of Current or Ex-Partner: No    Emotionally Abused: No    Physically Abused: No    Sexually Abused: No    Tobacco Use: High Risk (02/22/2023)   Patient History    Smoking Tobacco Use: Every Day    Smokeless Tobacco Use: Never    Passive Exposure: Never   Social History   Substance and Sexual Activity  Alcohol Use Yes   Comment: very little; occasionally    Family History  Problem Relation Age of Onset   Hypertension Mother    Hypertension Father    Hypertension Sister    Hypertension Brother     Review of Systems  Constitutional:  Negative for chills and fever.  HENT: Negative.    Eyes: Negative.   Respiratory:  Negative for cough and shortness of breath.    Cardiovascular:  Negative for chest pain and palpitations.  Gastrointestinal:  Negative for abdominal pain, nausea and vomiting.  Genitourinary:  Negative for dysuria, frequency and urgency.  Musculoskeletal:  Positive for joint pain.  Skin:  Negative for rash.   Objective:  Physical Exam: Well nourished and well developed.  General: Alert and oriented x3, cooperative and pleasant, no acute distress.  Head: normocephalic, atraumatic, neck supple.  Eyes: EOMI.  Abdomen: non-tender to palpation and soft, normoactive bowel sounds. Musculoskeletal: The patient has a significantly antalgic gait pattern and ambulates with a cane.  Right Hip Exam: The range of motion: Flexion to 100 degrees, Internal Rotation to 0 degrees, External Rotation to 0 degrees, and abduction to 20 degrees without discomfort. There is no tenderness over the greater trochanteric bursa.  Calves soft and nontender. Motor function intact in LE. Strength 5/5 LE bilaterally. Neuro: Distal pulses 2+. Sensation to light touch intact in LE.  Vital signs in last 24 hours: BP: ()/()  Arterial Line BP: ()/()   Imaging Review Plain radiographs demonstrate severe degenerative joint disease of the right hip. The bone quality appears to be adequate for age and reported activity level.  Assessment/Plan:  End stage arthritis, right hip  The patient history, physical examination, clinical judgement of the provider and imaging studies are consistent with end stage degenerative joint disease of the right hip and total hip arthroplasty is deemed medically necessary. The treatment options including medical management, injection therapy, arthroscopy and arthroplasty were discussed at length. The risks and benefits of total hip arthroplasty were presented and reviewed. The risks due to aseptic loosening, infection, stiffness, dislocation/subluxation, thromboembolic complications and other imponderables were discussed. The patient  acknowledged the explanation, agreed to proceed with the plan and consent was signed. Patient is being admitted for inpatient treatment for surgery, pain control, PT, OT, prophylactic antibiotics, VTE prophylaxis, progressive ambulation and ADLs and discharge planning.The patient is planning to be discharged  home .  Therapy Plans: HEP Disposition: Home with Girlfriend Planned DVT Prophylaxis: Prasugrel + ASA (hx two stents) DME Needed: None PCP: Doreen Beam, MD (requesting cardiac clearance) Cardiologist: Yates Decamp, MD (appt 6/11) TXA: IV Allergies: rosuvastatin (myalgia) Anesthesia Concerns: None BMI: 30.1 Last HgbA1c: not diabetic  Pharmacy: [facility]  Other: -Given clearance form to take  to cardiology appt 06/11 - checking with them on when to hold Prasugrel and aspirin -Currently takes hydrocodone and tramadol PRN  - Patient was instructed on what medications to stop prior to surgery. - Follow-up visit in 2 weeks with Dr. Lequita Halt - Begin physical therapy following surgery - Pre-operative lab work as pre-surgical testing - Prescriptions will be provided in hospital at time of discharge  R. Arcola Jansky, PA-C Orthopedic Surgery [facility] [place]

## 2023-03-20 NOTE — Patient Instructions (Signed)
SURGICAL WAITING ROOM VISITATION  Patients having surgery or a procedure may have no more than 2 support people in the waiting area - these visitors may rotate.    Children under the age of 54 must have an adult with them who is not the patient.  Due to an increase in RSV and influenza rates and associated hospitalizations, children ages 45 and under may not visit patients in [facility] hospitals.  If the patient needs to stay at the hospital during part of their recovery, the visitor guidelines for inpatient rooms apply. Pre-op nurse will coordinate an appropriate time for 1 support person to accompany patient in pre-op.  This support person may not rotate.    Please refer to the [facility] website for the visitor guidelines for Inpatients (after your surgery is over and you are in a regular room).       Your procedure is scheduled on: 04/03/23   Report to [facility] Main Entrance    Report to admitting at 11:15 AM   Call this number if you have problems the morning of surgery [phone]   Do not eat food :After Midnight.   After Midnight you may have the following liquids until 10:45 AM DAY OF SURGERY  Water Non-Citrus Juices (without pulp, NO RED-Apple, White grape, White cranberry) Black Coffee (NO MILK/CREAM OR CREAMERS, sugar ok)  Clear Tea (NO MILK/CREAM OR CREAMERS, sugar ok) regular and decaf                             Plain Jell-O (NO RED)                                           Fruit ices (not with fruit pulp, NO RED)                                     Popsicles (NO RED)                                                               Sports drinks like Gatorade (NO RED)                  The day of surgery:  Drink ONE (1) Pre-Surgery Clear Ensure 10:45 AM the morning of surgery. Drink in one sitting. Do not sip.  This drink was given to you during your hospital  pre-op appointment visit. Nothing else to drink after completing the  Pre-Surgery Clear  Ensure     FOLLOW BOWEL PREP AND ANY ADDITIONAL PRE OP INSTRUCTIONS YOU RECEIVED FROM YOUR SURGEON'S OFFICE!!!     Oral Hygiene is also important to reduce your risk of infection.                                    Remember - BRUSH YOUR TEETH THE MORNING OF SURGERY WITH YOUR REGULAR TOOTHPASTE  DENTURES WILL BE REMOVED PRIOR TO SURGERY PLEASE DO NOT APPLY "Poly grip" OR ADHESIVES!!!  Do NOT smoke after Midnight   Take these medicines the morning of surgery with A SIP OF WATER: Gabapentin, Hydrocodone-Acetaminophen, Metoprolol, Tramadol   Bring CPAP mask and tubing day of surgery.                              You may not have any metal on your body including hair pins, jewelry, and body piercing             Do not wear make-up, lotions, powders, cologne, or deodorant               Men may shave face and neck.   Do not bring valuables to the hospital. [facility] IS NOT             RESPONSIBLE   FOR VALUABLES.   Contacts, glasses, dentures or bridgework may not be worn into surgery.   Bring small overnight bag day of surgery.   DO NOT BRING YOUR HOME MEDICATIONS TO THE HOSPITAL. PHARMACY WILL DISPENSE MEDICATIONS LISTED ON YOUR MEDICATION LIST TO YOU DURING YOUR ADMISSION IN THE HOSPITAL!    Patients discharged on the day of surgery will not be allowed to drive home.  Someone NEEDS to stay with you for the first 24 hours after anesthesia.   Special Instructions: Bring a copy of your healthcare power of attorney and living will documents the day of surgery if you haven't scanned them before.              Please read over the following fact sheets you were given: IF YOU HAVE QUESTIONS ABOUT YOUR PRE-OP INSTRUCTIONS PLEASE CALL [phone]   If you received a COVID test during your pre-op visit  it is requested that you wear a mask when out in public, stay away from anyone that may not be feeling well and notify your surgeon if you develop symptoms. If you test positive for  Covid or have been in contact with anyone that has tested positive in the last 10 days please notify you surgeon.      Pre-operative 5 CHG Bath Instructions   You can play a key role in reducing the risk of infection after surgery. Your skin needs to be as free of germs as possible. You can reduce the number of germs on your skin by washing with CHG (chlorhexidine gluconate) soap before surgery. CHG is an antiseptic soap that kills germs and continues to kill germs even after washing.   DO NOT use if you have an allergy to chlorhexidine/CHG or antibacterial soaps. If your skin becomes reddened or irritated, stop using the CHG and notify one of our RNs at [phone].   Please shower with the CHG soap starting 4 days before surgery using the following schedule:     Please keep in mind the following:  DO NOT shave, including legs and underarms, starting the day of your first shower.   You may shave your face at any point before/day of surgery.  Place clean sheets on your bed the day you start using CHG soap. Use a clean washcloth (not used since being washed) for each shower. DO NOT sleep with pets once you start using the CHG.   CHG Shower Instructions:  If you choose to wash your hair and private area, wash first with your normal shampoo/soap.  After you use shampoo/soap, rinse your hair and body thoroughly to remove shampoo/soap residue.  Turn the water  OFF and apply about 3 tablespoons (45 ml) of CHG soap to a CLEAN washcloth.  Apply CHG soap ONLY FROM YOUR NECK DOWN TO YOUR TOES (washing for 3-5 minutes)  DO NOT use CHG soap on face, private areas, open wounds, or sores.  Pay special attention to the area where your surgery is being performed.  If you are having back surgery, having someone wash your back for you may be helpful. Wait 2 minutes after CHG soap is applied, then you may rinse off the CHG soap.  Pat dry with a clean towel  Put on clean clothes/pajamas   If you choose  to wear lotion, please use ONLY the CHG-compatible lotions on the back of this paper.     Additional instructions for the day of surgery: DO NOT APPLY any lotions, deodorants, cologne, or perfumes.   Put on clean/comfortable clothes.  Brush your teeth.  Ask your nurse before applying any prescription medications to the skin.      CHG Compatible Lotions   Aveeno Moisturizing lotion  Cetaphil Moisturizing Cream  Cetaphil Moisturizing Lotion  Clairol Herbal Essence Moisturizing Lotion, Dry Skin  Clairol Herbal Essence Moisturizing Lotion, Extra Dry Skin  Clairol Herbal Essence Moisturizing Lotion, Normal Skin  Curel Age Defying Therapeutic Moisturizing Lotion with Alpha Hydroxy  Curel Extreme Care Body Lotion  Curel Soothing Hands Moisturizing Hand Lotion  Curel Therapeutic Moisturizing Cream, Fragrance-Free  Curel Therapeutic Moisturizing Lotion, Fragrance-Free  Curel Therapeutic Moisturizing Lotion, Original Formula  Eucerin Daily Replenishing Lotion  Eucerin Dry Skin Therapy Plus Alpha Hydroxy Crme  Eucerin Dry Skin Therapy Plus Alpha Hydroxy Lotion  Eucerin Original Crme  Eucerin Original Lotion  Eucerin Plus Crme Eucerin Plus Lotion  Eucerin TriLipid Replenishing Lotion  Keri Anti-Bacterial Hand Lotion  Keri Deep Conditioning Original Lotion Dry Skin Formula Softly Scented  Keri Deep Conditioning Original Lotion, Fragrance Free Sensitive Skin Formula  Keri Lotion Fast Absorbing Fragrance Free Sensitive Skin Formula  Keri Lotion Fast Absorbing Softly Scented Dry Skin Formula  Keri Original Lotion  Keri Skin Renewal Lotion Keri Silky Smooth Lotion  Keri Silky Smooth Sensitive Skin Lotion  Nivea Body Creamy Conditioning Oil  Nivea Body Extra Enriched Lotion  Nivea Body Original Lotion  Nivea Body Sheer Moisturizing Lotion Nivea Crme  Nivea Skin Firming Lotion  NutraDerm 30 Skin Lotion  NutraDerm Skin Lotion  NutraDerm Therapeutic Skin Cream  NutraDerm Therapeutic  Skin Lotion  ProShield Protective Hand Cream  Provon moisturizing lotion Incentive Spirometer (Watch this video at home: [email or web address])  An incentive spirometer is a tool that can help keep your lungs clear and active. This tool measures how well you are filling your lungs with each breath. Taking long deep breaths may help reverse or decrease the chance of developing breathing (pulmonary) problems (especially infection) following: A long period of time when you are unable to move or be active. BEFORE THE PROCEDURE  If the spirometer includes an indicator to show your best effort, your nurse or respiratory therapist will set it to a desired goal. If possible, sit up straight or lean slightly forward. Try not to slouch. Hold the incentive spirometer in an upright position. INSTRUCTIONS FOR USE  Sit on the edge of your bed if possible, or sit up as far as you can in bed or on a chair. Hold the incentive spirometer in an upright position. Breathe out normally. Place the mouthpiece in your mouth and seal your lips tightly around it. Breathe in slowly and as deeply as possible,  raising the piston or the ball toward the top of the column. Hold your breath for 3-5 seconds or for as long as possible. Allow the piston or ball to fall to the bottom of the column. Remove the mouthpiece from your mouth and breathe out normally. Rest for a few seconds and repeat Steps 1 through 7 at least 10 times every 1-2 hours when you are awake. Take your time and take a few normal breaths between deep breaths. The spirometer may include an indicator to show your best effort. Use the indicator as a goal to work toward during each repetition. After each set of 10 deep breaths, practice coughing to be sure your lungs are clear. If you have an incision (the cut made at the time of surgery), support your incision when coughing by placing a pillow or rolled up towels firmly against it. Once you  are able to get out of bed, walk around indoors and cough well. You may stop using the incentive spirometer when instructed by your caregiver.  RISKS AND COMPLICATIONS Take your time so you do not get dizzy or light-headed. If you are in pain, you may need to take or ask for pain medication before doing incentive spirometry. It is harder to take a deep breath if you are having pain. AFTER USE Rest and breathe slowly and easily. It can be helpful to keep track of a log of your progress. Your caregiver can provide you with a simple table to help with this. If you are using the spirometer at home, follow these instructions: SEEK MEDICAL CARE IF:  You are having difficultly using the spirometer. You have trouble using the spirometer as often as instructed. Your pain medication is not giving enough relief while using the spirometer. You develop fever of 100.5 F (38.1 C) or higher. SEEK IMMEDIATE MEDICAL CARE IF:  You cough up bloody sputum that had not been present before. You develop fever of 102 F (38.9 C) or greater. You develop worsening pain at or near the incision site. MAKE SURE YOU:  Understand these instructions. Will watch your condition. Will get help right away if you are not doing well or get worse. Document Released: 02/06/2007 Document Revised: 12/19/2011 Document Reviewed: 04/09/2007 [facility] Patient Information 2014 [place], [place]. WHAT IS A BLOOD TRANSFUSION? Blood Transfusion Information  A transfusion is the replacement of blood or some of its parts. Blood is made up of multiple cells which provide different functions. Red blood cells carry oxygen and are used for blood loss replacement. White blood cells fight against infection. Platelets control bleeding. Plasma helps clot blood. Other blood products are available for specialized needs, such as hemophilia or other clotting disorders. BEFORE THE TRANSFUSION  Who gives blood for transfusions?  Healthy volunteers  who are fully evaluated to make sure their blood is safe. This is blood bank blood. Transfusion therapy is the safest it has ever been in the practice of medicine. Before blood is taken from a donor, a complete history is taken to make sure that person has no history of diseases nor engages in risky social behavior (examples are intravenous drug use or sexual activity with multiple partners). The donor's travel history is screened to minimize risk of transmitting infections, such as malaria. The donated blood is tested for signs of infectious diseases, such as HIV and hepatitis. The blood is then tested to be sure it is compatible with you in order to minimize the chance of a transfusion reaction. If you or  a relative donates blood, this is often done in anticipation of surgery and is not appropriate for emergency situations. It takes many days to process the donated blood. RISKS AND COMPLICATIONS Although transfusion therapy is very safe and saves many lives, the main dangers of transfusion include:  Getting an infectious disease. Developing a transfusion reaction. This is an allergic reaction to something in the blood you were given. Every precaution is taken to prevent this. The decision to have a blood transfusion has been considered carefully by your caregiver before blood is given. Blood is not given unless the benefits outweigh the risks. AFTER THE TRANSFUSION Right after receiving a blood transfusion, you will usually feel much better and more energetic. This is especially true if your red blood cells have gotten low (anemic). The transfusion raises the level of the red blood cells which carry oxygen, and this usually causes an energy increase. The nurse administering the transfusion will monitor you carefully for complications. HOME CARE INSTRUCTIONS  No special instructions are needed after a transfusion. You may find your energy is better. Speak with your caregiver about any limitations on  activity for underlying diseases you may have. SEEK MEDICAL CARE IF:  Your condition is not improving after your transfusion. You develop redness or irritation at the intravenous (IV) site. SEEK IMMEDIATE MEDICAL CARE IF:  Any of the following symptoms occur over the next 12 hours: Shaking chills. You have a temperature by mouth above 102 F (38.9 C), not controlled by medicine. Chest, back, or muscle pain. People around you feel you are not acting correctly or are confused. Shortness of breath or difficulty breathing. Dizziness and fainting. You get a rash or develop hives. You have a decrease in urine output. Your urine turns a dark color or changes to pink, red, or brown. Any of the following symptoms occur over the next 10 days: You have a temperature by mouth above 102 F (38.9 C), not controlled by medicine. Shortness of breath. Weakness after normal activity. The white part of the eye turns yellow (jaundice). You have a decrease in the amount of urine or are urinating less often. Your urine turns a dark color or changes to pink, red, or brown. Document Released: 09/23/2000 Document Revised: 12/19/2011 Document Reviewed: 05/12/2008 [facility] Patient Information 2014 [place], [place].

## 2023-03-20 NOTE — Progress Notes (Signed)
COVID Vaccine received:  []  No [x]  Yes Date of any COVID positive Test in last 90 days:  PCP - Doreen Beam MD Cardiologist -   Chest x-ray -  CT 02/23/23 EPIC EKG -  10/27/22  EPIC Stress Test -  ECHO - 10/19/22  EPIC Cardiac Cath - 10/18/22 EPIC  Bowel Prep - [x]  No  []   Yes ______  Pacemaker / ICD device [x]  No []  Yes   Spinal Cord Stimulator:[]  No []  Yes       History of Sleep Apnea? []  No []  Yes   CPAP used?- []  No []  Yes    Does the patient monitor blood sugar?          [x]  No []  Yes  []  N/A  Patient has: [x]  NO Hx DM   []  Pre-DM                 []  DM1  []   DM2 Does patient have a [employer] or Dexacom? []  No []  Yes   Fasting Blood Sugar Ranges-  Checks Blood Sugar _____ times a day  GLP1 agonist / usual dose -  GLP1 instructions:  SGLT-2 inhibitors / usual dose -  SGLT-2 instructions:   Blood Thinner / Instructions:Effient Aspirin Instructions:81mg  ASA  Comments:   Activity level: Patient is able / unable to climb a flight of stairs without difficulty; []  No CP  []  No SOB, but would have ___   Patient can / can not perform ADLs without assistance.   Anesthesia review:   Patient denies shortness of breath, fever, cough and chest pain at PAT appointment.  Patient verbalized understanding and agreement to the Pre-Surgical Instructions that were given to them at this PAT appointment. Patient was also educated of the need to review these PAT instructions again prior to his/her surgery.I reviewed the appropriate phone numbers to call if they have any and questions or concerns.

## 2023-03-21 ENCOUNTER — Encounter: Payer: Self-pay | Admitting: Cardiology

## 2023-03-21 ENCOUNTER — Ambulatory Visit: Payer: Medicare Other | Admitting: Cardiology

## 2023-03-21 ENCOUNTER — Other Ambulatory Visit: Payer: Self-pay

## 2023-03-21 ENCOUNTER — Encounter (HOSPITAL_COMMUNITY): Payer: Self-pay

## 2023-03-21 ENCOUNTER — Encounter (HOSPITAL_COMMUNITY)
Admission: RE | Admit: 2023-03-21 | Discharge: 2023-03-21 | Disposition: A | Discharge status: Home / Self Care | Payer: Medicare Other | Source: Ambulatory Visit | Attending: Orthopedic Surgery | Admitting: Orthopedic Surgery

## 2023-03-21 VITALS — BP 102/61 | HR 61 | Resp 16 | Ht 72.0 in | Wt 224.0 lb

## 2023-03-21 DIAGNOSIS — Z01812 Encounter for preprocedural laboratory examination: Secondary | ICD-10-CM | POA: Insufficient documentation

## 2023-03-21 DIAGNOSIS — I2510 Atherosclerotic heart disease of native coronary artery without angina pectoris: Secondary | ICD-10-CM

## 2023-03-21 DIAGNOSIS — F17200 Nicotine dependence, unspecified, uncomplicated: Secondary | ICD-10-CM | POA: Diagnosis not present

## 2023-03-21 DIAGNOSIS — I10 Essential (primary) hypertension: Secondary | ICD-10-CM | POA: Insufficient documentation

## 2023-03-21 DIAGNOSIS — M1611 Unilateral primary osteoarthritis, right hip: Secondary | ICD-10-CM | POA: Diagnosis not present

## 2023-03-21 DIAGNOSIS — J449 Chronic obstructive pulmonary disease, unspecified: Secondary | ICD-10-CM | POA: Insufficient documentation

## 2023-03-21 DIAGNOSIS — E7800 Pure hypercholesterolemia, unspecified: Secondary | ICD-10-CM

## 2023-03-21 DIAGNOSIS — Z79891 Long term (current) use of opiate analgesic: Secondary | ICD-10-CM | POA: Diagnosis not present

## 2023-03-21 DIAGNOSIS — G8929 Other chronic pain: Secondary | ICD-10-CM | POA: Diagnosis not present

## 2023-03-21 DIAGNOSIS — I252 Old myocardial infarction: Secondary | ICD-10-CM | POA: Diagnosis not present

## 2023-03-21 DIAGNOSIS — Z01818 Encounter for other preprocedural examination: Secondary | ICD-10-CM

## 2023-03-21 HISTORY — DX: Paralytic syndrome, unspecified: G83.9

## 2023-03-21 HISTORY — DX: Acute myocardial infarction, unspecified: I21.9

## 2023-03-21 HISTORY — DX: Chronic obstructive pulmonary disease, unspecified: J44.9

## 2023-03-21 HISTORY — DX: Atherosclerotic heart disease of native coronary artery without angina pectoris: I25.10

## 2023-03-21 HISTORY — DX: Unspecified osteoarthritis, unspecified site: M19.90

## 2023-03-21 LAB — BASIC METABOLIC PANEL
Anion gap: 7 (ref 5–15)
BUN: 33 mg/dL — ABNORMAL HIGH (ref 8–23)
CO2: 26 mmol/L (ref 22–32)
Calcium: 9.5 mg/dL (ref 8.9–10.3)
Chloride: 105 mmol/L (ref 98–111)
Creatinine, Ser: 1.1 mg/dL (ref 0.61–1.24)
GFR, Estimated: >60 mL/min (ref >60)
Glucose, Bld: 105 mg/dL — ABNORMAL HIGH (ref 70–99)
Potassium: 5.2 mmol/L — ABNORMAL HIGH (ref 3.5–5.1)
Sodium: 138 mmol/L (ref 135–145)

## 2023-03-21 LAB — TYPE AND SCREEN
ABO/RH(D): O POS
Antibody Screen: NEGATIVE

## 2023-03-21 LAB — CBC
HCT: 48.5 % (ref 39.0–52.0)
Hemoglobin: 16.6 g/dL (ref 13.0–17.0)
MCH: 31.9 pg (ref 26.0–34.0)
MCHC: 34.2 g/dL (ref 30.0–36.0)
MCV: 93.3 fL (ref 80.0–100.0)
Platelets: 268 10*3/uL (ref 150–400)
RBC: 5.2 MIL/uL (ref 4.22–5.81)
RDW: 15 % (ref 11.5–15.5)
WBC: 7.3 10*3/uL (ref 4.0–10.5)
nRBC: 0 % (ref 0.0–0.2)

## 2023-03-21 LAB — SURGICAL PCR SCREEN
MRSA, PCR: NEGATIVE
Staphylococcus aureus: NEGATIVE

## 2023-03-21 MED ORDER — NEXLIZET 180-10 MG PO TABS
1.0000 | ORAL_TABLET | Freq: Every day | ORAL | 2 refills | Status: DC
Start: 2023-03-21 — End: 2023-11-10

## 2023-03-21 MED ORDER — ATORVASTATIN CALCIUM 20 MG PO TABS: 10.0000 mg | ORAL_TABLET | ORAL | 0 refills | Status: DC

## 2023-03-21 NOTE — Patient Instructions (Signed)
SURGICAL WAITING ROOM VISITATION  Patients having surgery or a procedure may have no more than 2 support people in the waiting area - these visitors may rotate.    Children under the age of 1 must have an adult with them who is not the patient.  Due to an increase in RSV and influenza rates and associated hospitalizations, children ages 34 and under may not visit patients in [facility] hospitals.  If the patient needs to stay at the hospital during part of their recovery, the visitor guidelines for inpatient rooms apply. Pre-op nurse will coordinate an appropriate time for 1 support person to accompany patient in pre-op.  This support person may not rotate.    Please refer to the [facility] website for the visitor guidelines for Inpatients (after your surgery is over and you are in a regular room).       Your procedure is scheduled on: 04/03/23   Report to [facility] Main Entrance    Report to admitting at 11:15 AM   Call this number if you have problems the morning of surgery [phone]   Do not eat food :After Midnight.   After Midnight you may have the following liquids until 10:45 AM  DAY OF SURGERY  Water Non-Citrus Juices (without pulp, NO RED-Apple, White grape, White cranberry) Black Coffee (NO MILK/CREAM OR CREAMERS, sugar ok)  Clear Tea (NO MILK/CREAM OR CREAMERS, sugar ok) regular and decaf                             Plain Jell-O (NO RED)                                           Fruit ices (not with fruit pulp, NO RED)                                     Popsicles (NO RED)                                                               Sports drinks like Gatorade (NO RED)                  The day of surgery:  Drink ONE (1) Pre-Surgery Clear Ensure  at 10:45 AM the morning of surgery. Drink in one sitting. Do not sip.  This drink was given to you during your hospital  pre-op appointment visit. Nothing else to drink after completing the  Pre-Surgery  Clear Ensure .          If you have questions, please contact your surgeon's office.   FOLLOW BOWEL PREP AND ANY ADDITIONAL PRE OP INSTRUCTIONS YOU RECEIVED FROM YOUR SURGEON'S OFFICE!!!     Oral Hygiene is also important to reduce your risk of infection.                                    Remember - BRUSH YOUR TEETH THE MORNING OF SURGERY  WITH YOUR REGULAR TOOTHPASTE  DENTURES WILL BE REMOVED PRIOR TO SURGERY PLEASE DO NOT APPLY "Poly grip" OR ADHESIVES!!!   Do NOT smoke after Midnight   Take these medicines the morning of surgery with A SIP OF WATER: Gabapentin, Hydrocodone-acetaminophen, Metoprolol, Tramadol  Bring CPAP mask and tubing day of surgery.                              You may not have any metal on your body including hair pins, jewelry, and body piercing             Do not wear make-up, lotions, powders, cologne, or deodorant  Do not wear nail polish including gel and S&S, artificial/acrylic nails, or any other type of covering on natural nails including finger and toenails. If you have artificial nails, gel coating, etc. that needs to be removed by a nail salon please have this removed prior to surgery or surgery may need to be canceled/ delayed if the surgeon/ anesthesia feels like they are unable to be safely monitored.  .               Men may shave face and neck.   Do not bring valuables to the hospital. [facility] IS NOT             RESPONSIBLE   FOR VALUABLES.   Contacts, glasses, dentures or bridgework may not be worn into surgery.   Bring small overnight bag day of surgery.   DO NOT BRING YOUR HOME MEDICATIONS TO THE HOSPITAL. PHARMACY WILL DISPENSE MEDICATIONS LISTED ON YOUR MEDICATION LIST TO YOU DURING YOUR ADMISSION IN THE HOSPITAL!    Patients discharged on the day of surgery will not be allowed to drive home.  Someone NEEDS to stay with you for the first 24 hours after anesthesia.   Special Instructions: Bring a copy of your healthcare power of  attorney and living will documents the day of surgery if you haven't scanned them before.              Please read over the following fact sheets you were given: IF YOU HAVE QUESTIONS ABOUT YOUR PRE-OP INSTRUCTIONS PLEASE CALL [phone]   If you received a COVID test during your pre-op visit  it is requested that you wear a mask when out in public, stay away from anyone that may not be feeling well and notify your surgeon if you develop symptoms. If you test positive for Covid or have been in contact with anyone that has tested positive in the last 10 days please notify you surgeon.      Pre-operative 5 CHG Bath Instructions   You can play a key role in reducing the risk of infection after surgery. Your skin needs to be as free of germs as possible. You can reduce the number of germs on your skin by washing with CHG (chlorhexidine gluconate) soap before surgery. CHG is an antiseptic soap that kills germs and continues to kill germs even after washing.   DO NOT use if you have an allergy to chlorhexidine/CHG or antibacterial soaps. If your skin becomes reddened or irritated, stop using the CHG and notify one of our RNs at [phone].   Please shower with the CHG soap starting 4 days before surgery using the following schedule:     Please keep in mind the following:  DO NOT shave, including legs and underarms, starting the day of your  first shower.   You may shave your face at any point before/day of surgery.  Place clean sheets on your bed the day you start using CHG soap. Use a clean washcloth (not used since being washed) for each shower. DO NOT sleep with pets once you start using the CHG.   CHG Shower Instructions:  If you choose to wash your hair and private area, wash first with your normal shampoo/soap.  After you use shampoo/soap, rinse your hair and body thoroughly to remove shampoo/soap residue.  Turn the water OFF and apply about 3 tablespoons (45 ml) of CHG soap to a  CLEAN washcloth.  Apply CHG soap ONLY FROM YOUR NECK DOWN TO YOUR TOES (washing for 3-5 minutes)  DO NOT use CHG soap on face, private areas, open wounds, or sores.  Pay special attention to the area where your surgery is being performed.  If you are having back surgery, having someone wash your back for you may be helpful. Wait 2 minutes after CHG soap is applied, then you may rinse off the CHG soap.  Pat dry with a clean towel  Put on clean clothes/pajamas   If you choose to wear lotion, please use ONLY the CHG-compatible lotions on the back of this paper.     Additional instructions for the day of surgery: DO NOT APPLY any lotions, deodorants, cologne, or perfumes.   Put on clean/comfortable clothes.  Brush your teeth.  Ask your nurse before applying any prescription medications to the skin.      CHG Compatible Lotions   Aveeno Moisturizing lotion  Cetaphil Moisturizing Cream  Cetaphil Moisturizing Lotion  Clairol Herbal Essence Moisturizing Lotion, Dry Skin  Clairol Herbal Essence Moisturizing Lotion, Extra Dry Skin  Clairol Herbal Essence Moisturizing Lotion, Normal Skin  Curel Age Defying Therapeutic Moisturizing Lotion with Alpha Hydroxy  Curel Extreme Care Body Lotion  Curel Soothing Hands Moisturizing Hand Lotion  Curel Therapeutic Moisturizing Cream, Fragrance-Free  Curel Therapeutic Moisturizing Lotion, Fragrance-Free  Curel Therapeutic Moisturizing Lotion, Original Formula  Eucerin Daily Replenishing Lotion  Eucerin Dry Skin Therapy Plus Alpha Hydroxy Crme  Eucerin Dry Skin Therapy Plus Alpha Hydroxy Lotion  Eucerin Original Crme  Eucerin Original Lotion  Eucerin Plus Crme Eucerin Plus Lotion  Eucerin TriLipid Replenishing Lotion  Keri Anti-Bacterial Hand Lotion  Keri Deep Conditioning Original Lotion Dry Skin Formula Softly Scented  Keri Deep Conditioning Original Lotion, Fragrance Free Sensitive Skin Formula  Keri Lotion Fast Absorbing Fragrance Free  Sensitive Skin Formula  Keri Lotion Fast Absorbing Softly Scented Dry Skin Formula  Keri Original Lotion  Keri Skin Renewal Lotion Keri Silky Smooth Lotion  Keri Silky Smooth Sensitive Skin Lotion  Nivea Body Creamy Conditioning Oil  Nivea Body Extra Enriched Teacher, adult education Moisturizing Lotion Nivea Crme  Nivea Skin Firming Lotion  NutraDerm 30 Skin Lotion  NutraDerm Skin Lotion  NutraDerm Therapeutic Skin Cream  NutraDerm Therapeutic Skin Lotion  ProShield Protective Hand Cream  Provon moisturizing lotion  WHAT IS A BLOOD TRANSFUSION? Blood Transfusion Information  A transfusion is the replacement of blood or some of its parts. Blood is made up of multiple cells which provide different functions. Red blood cells carry oxygen and are used for blood loss replacement. White blood cells fight against infection. Platelets control bleeding. Plasma helps clot blood. Other blood products are available for specialized needs, such as hemophilia or other clotting disorders. BEFORE THE TRANSFUSION  Who gives blood for transfusions?  Healthy  volunteers who are fully evaluated to make sure their blood is safe. This is blood bank blood. Transfusion therapy is the safest it has ever been in the practice of medicine. Before blood is taken from a donor, a complete history is taken to make sure that person has no history of diseases nor engages in risky social behavior (examples are intravenous drug use or sexual activity with multiple partners). The donor's travel history is screened to minimize risk of transmitting infections, such as malaria. The donated blood is tested for signs of infectious diseases, such as HIV and hepatitis. The blood is then tested to be sure it is compatible with you in order to minimize the chance of a transfusion reaction. If you or a relative donates blood, this is often done in anticipation of surgery and is not appropriate for emergency  situations. It takes many days to process the donated blood. RISKS AND COMPLICATIONS Although transfusion therapy is very safe and saves many lives, the main dangers of transfusion include:  Getting an infectious disease. Developing a transfusion reaction. This is an allergic reaction to something in the blood you were given. Every precaution is taken to prevent this. The decision to have a blood transfusion has been considered carefully by your caregiver before blood is given. Blood is not given unless the benefits outweigh the risks. AFTER THE TRANSFUSION Right after receiving a blood transfusion, you will usually feel much better and more energetic. This is especially true if your red blood cells have gotten low (anemic). The transfusion raises the level of the red blood cells which carry oxygen, and this usually causes an energy increase. The nurse administering the transfusion will monitor you carefully for complications. HOME CARE INSTRUCTIONS  No special instructions are needed after a transfusion. You may find your energy is better. Speak with your caregiver about any limitations on activity for underlying diseases you may have. SEEK MEDICAL CARE IF:  Your condition is not improving after your transfusion. You develop redness or irritation at the intravenous (IV) site. SEEK IMMEDIATE MEDICAL CARE IF:  Any of the following symptoms occur over the next 12 hours: Shaking chills. You have a temperature by mouth above 102 F (38.9 C), not controlled by medicine. Chest, back, or muscle pain. People around you feel you are not acting correctly or are confused. Shortness of breath or difficulty breathing. Dizziness and fainting. You get a rash or develop hives. You have a decrease in urine output. Your urine turns a dark color or changes to pink, red, or brown. Any of the following symptoms occur over the next 10 days: You have a temperature by mouth above 102 F (38.9 C), not controlled  by medicine. Shortness of breath. Weakness after normal activity. The white part of the eye turns yellow (jaundice). You have a decrease in the amount of urine or are urinating less often. Your urine turns a dark color or changes to pink, red, or brown. Document Released: 09/23/2000 Document Revised: 12/19/2011 Document Reviewed: 05/12/2008 [facility] Patient Information 2014 [place], [place].

## 2023-03-21 NOTE — Progress Notes (Signed)
Primary Physician/Referring:  Ignatius Specking, MD  Patient ID: Leon Mitchell, male    DOB: [birth year], 67 y.o.   MRN: [id number]  Chief Complaint  Patient presents with   Coronary artery disease involving native coronary artery of   Follow-up    HPI:    Leon Mitchell  is a 68 y.o. 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   He had called her stating that he was unable to tolerate 20 mg of Lipitor a daily basis, hence has reduced to every other day but stopped due to myalgia.  He is still using a walker to help support due to hip pain and bilateral knee pain.  He has not had any recurrence of chest pain, denies dyspnea, no dizziness or syncope.     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  Musculoskeletal:  Positive for joint pain.    Objective      03/21/2023    2:05 PM 03/21/2023    1:58 PM 03/21/2023   11:11 AM  Vitals with BMI  Height  6\' 0"  6\' 0"   Weight  224 lbs 219 lbs  BMI  30.37 29.7  Systolic 102 94 139  Diastolic 61 57 89  Pulse 61 58 60   SpO2: 96 %  Physical Exam Neck:     Vascular: No carotid bruit or JVD.  Cardiovascular:     Rate and Rhythm: Normal rate and regular rhythm.     Pulses: Intact distal pulses.     Heart sounds: Normal heart sounds. No murmur heard.    No gallop.  Pulmonary:     Effort: Pulmonary effort is normal.     Breath sounds: Normal breath sounds.  Abdominal:     General: Bowel sounds are normal.     Palpations: Abdomen is soft.  Musculoskeletal:     Right lower leg: No edema.     Left lower leg: No edema.    Medications and allergies   Allergies  Allergen Reactions   Lipitor [Atorvastatin] Other (See Comments)    Severe myalgia   Rosuvastatin Other (See Comments)    Severe myalgia     Medication list   Current Outpatient  Medications:    aspirin EC 81 MG tablet, Take 81 mg by mouth in the morning. Swallow whole., Disp: , Rfl:    Bempedoic Acid-Ezetimibe (NEXLIZET) 180-10 MG TABS, Take 1 tablet by mouth daily., Disp: 90 tablet, Rfl: 2   diclofenac (VOLTAREN) 75 MG EC tablet, Take 75 mg by mouth 2 (two) times daily., Disp: , Rfl:    gabapentin (NEURONTIN) 300 MG capsule, Take 600 mg by mouth 2 (two) times daily., Disp: , Rfl:    HYDROcodone-acetaminophen (NORCO/VICODIN) 5-325 MG tablet, Take 1 tablet by mouth 3 (three) times daily as needed for moderate pain., Disp: , Rfl:    lisinopril (ZESTRIL) 20 MG tablet, TAKE 1 TABLET BY MOUTH DAILY, Disp: 90 tablet, Rfl: 3   metoprolol succinate (TOPROL-XL) 25 MG 24 hr tablet, Take 1 tablet (25 mg total) by mouth daily., Disp: 90 tablet, Rfl: 1   nitroGLYCERIN (NITROSTAT) 0.4 MG SL tablet, Place 1 tablet (0.4 mg total) under the tongue every 5 (five) minutes as needed for up to 25 days for chest pain., Disp: 25  tablet, Rfl: 3   prasugrel (EFFIENT) 10 MG TABS tablet, Take 1 tablet (10 mg total) by mouth daily., Disp: 90 tablet, Rfl: 3   traMADol (ULTRAM) 50 MG tablet, TAKE 1 TABLET BY MOUTH TWICE DAILY AS NEEDED, Disp: 60 tablet, Rfl: 1   Vitamin D, Ergocalciferol, (DRISDOL) 1.25 MG (50000 UNIT) CAPS capsule, Take 50,000 Units by mouth every 7 (seven) days., Disp: , Rfl:    atorvastatin (LIPITOR) 20 MG tablet, Take 0.5 tablets (10 mg total) by mouth every other day., Disp: 60 tablet, Rfl: 0   cyclobenzaprine (FLEXERIL) 10 MG tablet, Take 1 tablet (10 mg total) by mouth 3 (three) times daily as needed for muscle spasms. (Patient not taking: Reported on 03/21/2023), Disp: 30 tablet, Rfl: 0 Laboratory examination:   Recent Labs    10/18/22 2323 10/18/22 2328 10/19/22 0233 12/23/22 1205 03/21/23 1145  NA 138   < > 137 138 138  K 3.5   < > 3.8 5.0 5.2*  CL 106   < > 106 101 105  CO2 24  --  21* 25 26  GLUCOSE 139*   < > 123* 90 105*  BUN 30*   < > 27* 27 33*  CREATININE 1.14   < > 1.11 0.90 1.10  CALCIUM 8.7*  --  9.0 9.4 9.5  GFRNONAA >60  --  >60  --  >60   < > = values in this interval not displayed.    Lab Results  Component Value Date   GLUCOSE 105 (H) 03/21/2023   NA 138 03/21/2023   K 5.2 (H) 03/21/2023   CL 105 03/21/2023   CO2 26 03/21/2023   BUN 33 (H) 03/21/2023   CREATININE 1.10 03/21/2023   GFRNONAA >60 03/21/2023   CALCIUM 9.5 03/21/2023   PROT 6.5 12/23/2022   ALBUMIN 4.0 12/23/2022   LABGLOB 2.5 12/23/2022   AGRATIO 1.6 12/23/2022   BILITOT 0.6 12/23/2022   ALKPHOS 110 12/23/2022   AST 16 12/23/2022   ALT 17 12/23/2022   ANIONGAP 7 03/21/2023        Latest Ref Rng & Units 12/23/2022   12:05 PM 10/18/2022   11:23 PM 04/07/2022    8:27 AM  Hepatic Function  Total Protein 6.0 - 8.5 g/dL 6.5  6.3  6.7   Albumin 3.9 - 4.9 g/dL 4.0  3.6  3.5   AST 0 - 40 IU/L 16  24  19   ALT 0 - 44 IU/L 17  17  15    Alk Phosphatase 44 - 121 IU/L 110  96  88   Total Bilirubin 0.0 -  1.2 mg/dL 0.6  0.5  0.5    Lipid Panel Recent Labs    04/20/22 0321 10/18/22 2323 10/19/22 0233 12/23/22 1205  CHOL 192 211* 203* 144  TRIG 104 84 75 77  LDLCALC 137* 159* 147* 91  VLDL 21 17 15   --   HDL 34* 35* 41 38*  CHOLHDL 5.6 6.0 5.0  --    Lipoprotein (a) 04/21/23 <75.0 nmol/L 40.4 High    HEMOGLOBIN A1C Lab Results  Component Value Date   HGBA1C 5.1 10/18/2022   MPG 99.67 10/18/2022   TSH Recent Labs    10/19/22 0233  TSH 2.328   Radiology:   Cardiac Studies:   Echocardiogram 10/19/2022:   1. Left ventricular ejection fraction, by estimation, is 60 to 65%. The left ventricle has normal function. The left ventricle has no regional wall motion abnormalities. Left ventricular diastolic parameters were normal.  2. Right ventricular systolic function is normal. The right ventricular size is normal.  3. The mitral valve is normal in structure. No evidence of mitral valve regurgitation. No evidence of mitral stenosis.  4. The aortic valve is normal in structure. Aortic valve regurgitation is mild. No aortic stenosis is present.  5. The inferior vena cava is normal in size with greater than 50% respiratory variability, suggesting right atrial pressure of 3 mmHg.   Left Heart Catheterization 10/19/22:  LV: LV normal, LVEF 45% with inferior akinesis.  No significant regurgitation. LM: Large-caliber vessel.  Smooth and normal. LAD: Gives origin to large D1 and D2.  Mild disease is noted in the midsegment of the LAD. Cx: Small, mid segment has a 30% mild disease, resulting to large OM1 and OM 2. RCA: Large-caliber vessel and a dominant vessel.  Has moderate tortuosity in the proximal segment at the crux.  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  There is distal embolization in the PL and PDA branches.   Intervention data: Successful aspiration thrombectomy using priority 1 thrombectomy catheter followed by direct stenting with 4.0 x 16 mm Synergy XD at 20  atmospheric pressure at the proximal circumflex at the proximal, 2 to the proximal edge dissection, covered with a 4.5 by 12 mm  Synergy XD deployed at 13 atm pressure for 30 seconds then postdilated with the same stent balloon at 16 pressure pressure between the stent starts.   Recommendation: Patient will need dual antiplatelet therapy with aspirin and Brilinta for a period of 1 year.  I will start him on cangrelor infusion until infusion is done, patient did receive 180 mg of Brilinta in the Cath Lab.  150 mcg utilized.    EKG:   EKG 10/27/2022: Marked sinus bradycardia at the rate of 49 bpm.  Normal axis, no evidence of ischemia.  Normal EKG.  EKG 10/19/2022: Normal sinus rhythm without evidence of ischemia or infarct. Normal EKG. Compared to 10/18/2022, inferior and posterior ST segment changes suggestive of acute injury pattern no longer present.   Assessment     ICD-10-CM   1. Coronary artery disease involving native coronary artery of native heart without angina pectoris  I25.10 Bempedoic Acid-Ezetimibe (NEXLIZET) 180-10 MG TABS    atorvastatin (LIPITOR) 20 MG tablet    2. Pure hypercholesterolemia  E78.00 Bempedoic Acid-Ezetimibe (NEXLIZET) 180-10 MG TABS    Lipid Panel With  LDL/HDL Ratio    3. Primary hypertension  I10     4. Primary osteoarthritis of right hip  M16.11        Orders Placed This Encounter  Procedures   Lipid Panel With LDL/HDL Ratio    Standing Status:   Future    Standing Expiration Date:   03/20/2024    Meds ordered this encounter  Medications   Bempedoic Acid-Ezetimibe (NEXLIZET) 180-10 MG TABS    Sig: Take 1 tablet by mouth daily.    Dispense:  90 tablet    Refill:  2   atorvastatin (LIPITOR) 20 MG tablet    Sig: Take 0.5 tablets (10 mg total) by mouth every other day.    Dispense:  60 tablet    Refill:  0    Refills per PCP    Medications Discontinued During This Encounter  Medication Reason   ezetimibe (ZETIA) 10 MG tablet Patient Preference       Recommendations:   DOEL HEITING is a 67 y.o.  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   1. Coronary artery disease involving native coronary artery of native heart without angina pectoris Patient remains angina free.  He has started to move around with his walker without any chest pain or dyspnea.  2. Pure hypercholesterolemia He has not been able to tolerate Lipitor at full dose, presently on 20 mg every other day.  He tried Crestor in the past and could not tolerate this due to severe myalgia.  He is also on Zetia 10 mg daily.  I have advised him to see whether he could try taking atorvastatin just 10 mg every other day or every third day.  He will continue with Zetia for now, I will try to add Nexlizet 1 p.o. daily, if his insurance will allow.  3. Primary hypertension Blood pressure is under excellent control.  Continue present medical therapy.  4. Primary osteoarthritis of right hip Patient is scheduled for right total hip arthroplasty on 04/03/2023 by Dr. Ollen Gross, I have sent medical clearance letter to Dr. Lequita Halt for Brilinta 5 days prior to surgery and continue aspirin periprocedurally.  5. Tobacco abuse counseling Again discussed regarding tobacco use  3 months for follow-up, this is to follow-up on his lipids particularly.  Some  Yates Decamp, MD, [facility] 03/21/2023, 2:57 PM Office: [phone]

## 2023-03-21 NOTE — Patient Instructions (Signed)
Please hold Brilinta for 5 days prior to your hip surgery.  Please do not stop aspirin.  I have reduced the dose of your atorvastatin from 20 mg every other day to 10 mg every other day.  Continue ezetimibe 10 mg daily until the new medication comes in called  Nexlizet which she will take 1 pill once a day.

## 2023-03-21 NOTE — Progress Notes (Addendum)
COVID Vaccine received:  []  No [x]  Yes Date of any COVID positive Test in last 25 days:No  PCP - Doreen Beam MD Cardiologist - Jacinto Halim MD  Chest x-ray -  CT 02/23/23 EPIC EKG -  10/27/22  EPIC Stress Test -  ECHO - 10/19/22  EPIC Cardiac Cath - 10/18/22 EPIC  Bowel Prep - [x]  No  []   Yes ______  Pacemaker / ICD device [x]  No []  Yes   Spinal Cord Stimulator:[x]  No []  Yes       History of Sleep Apnea? [x]  No []  Yes   CPAP used?- []  No []  Yes    Does the patient monitor blood sugar?          [x]  No []  Yes  []  N/A  Patient has: [x]  NO Hx DM   []  Pre-DM                 []  DM1  []   DM2 Does patient have a [employer] or Dexacom? [x]  No []  Yes   Fasting Blood Sugar Ranges-  Checks Blood Sugar _____ times a day  GLP1 agonist / usual dose - No GLP1 instructions:  SGLT-2 inhibitors / usual dose - No SGLT-2 instructions:   Blood Thinner / Instructions:Effient Aspirin Instructions:81mg  ASA -Has Cardiology appt. Today. Will ask Dr. Jacinto Halim .  Activity level: Patient is able  to climb a flight of stairs without difficulty; [x]  No CP  [x]  No SOB,   Patient can / can not perform ADLs without assistance.   Anesthesia review: CAD, HTN, Smoker, MI 10/2022, COPD  Patient denies shortness of breath, fever, cough and chest pain at PAT appointment.  Patient verbalized understanding and agreement to the Pre-Surgical Instructions that were given to them at this PAT appointment. Patient was also educated of the need to review these PAT instructions again prior to his/her surgery.I reviewed the appropriate phone numbers to call if they have any and questions or concerns.

## 2023-03-21 NOTE — Progress Notes (Deleted)
COVID Vaccine received:  []  No [x]  Yes Date of any COVID positive Test in last 58 days:No  PCP - Doreen Beam MD Cardiologist - Jacinto Halim MD  Chest x-ray -  CT 02/23/23 EPIC EKG -  10/27/22  EPIC Stress Test -  ECHO - 10/19/22  EPIC Cardiac Cath - 10/18/22 EPIC  Bowel Prep - [x]  No  []   Yes ______  Pacemaker / ICD device [x]  No []  Yes   Spinal Cord Stimulator:[x]  No []  Yes       History of Sleep Apnea? [x]  No []  Yes   CPAP used?- []  No []  Yes    Does the patient monitor blood sugar?          [x]  No []  Yes  []  N/A  Patient has: [x]  NO Hx DM   []  Pre-DM                 []  DM1  []   DM2 Does patient have a [employer] or [employer]? [x]  No []  Yes   Fasting Blood Sugar Ranges-  Checks Blood Sugar _____ times a day  GLP1 agonist / usual dose - No GLP1 instructions:  SGLT-2 inhibitors / usual dose - No SGLT-2 instructions:   Blood Thinner / Instructions:Effient Aspirin Instructions:81mg  ASA  Comments: Has Cardiology appt. Today. Will ask MD  Activity level: Patient is able  to climb a flight of stairs without difficulty; [x]  No CP  [x]  No SOB,   Patient can / can not perform ADLs without assistance.   Anesthesia review:   Patient denies shortness of breath, fever, cough and chest pain at PAT appointment.  Patient verbalized understanding and agreement to the Pre-Surgical Instructions that were given to them at this PAT appointment. Patient was also educated of the need to review these PAT instructions again prior to his/her surgery.I reviewed the appropriate phone numbers to call if they have any and questions or concerns.

## 2023-03-22 ENCOUNTER — Encounter (HOSPITAL_COMMUNITY): Payer: Self-pay

## 2023-03-22 NOTE — Progress Notes (Signed)
Case: [id number] Date/Time: 04/03/23 1330   Procedure: TOTAL HIP ARTHROPLASTY ANTERIOR APPROACH (Right: Hip)   Anesthesia type: Choice   Pre-op diagnosis: right hip osteoarthritis   Location: [facility] ROOM 09 / [facility]   Surgeons: Ollen Gross, MD       DISCUSSION: Leon Mitchell is a 67 year old male who presents to PAT prior to surgery listed above.  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  He was last seen on 03/21/2023.  Unable to tolerate statins but takes Zetia.   He denies any chest pain or shortness of breath.  Cardiac clearance and antiplatelet guidance provided in letter:  "[name] is at low risk, from a cardiac standpoint, for his upcoming procedure: Right hip arthroplasty.  It is ok to proceed without further cardiac testing. If applicable can hold Brilinta for 5  day(s) prior to procedure and re-start 2-3  days post procedure. Do not hold ASA 81 mg daily  Patient follows with pulmonology for his COPD and surveillance of a pulmonary nodule in the right upper lobe.  He was last seen on 11/15/2022 to review his PET scan.  His respiratory symptoms have been stable although he continues to smoke.  He also follows with vascular surgery due to history of carotid artery stenosis and is status post CEA in July 2023.  He was last seen on 02/22/2023 and was noted to be stable.  Advised to follow-up in 1 year.   VS: BP 139/89   Pulse 60   Temp 36.6 C (Oral)   Resp 16   Ht 6' (1.829 m)   Wt 99.3 kg   SpO2 95%   BMI 29.70 kg/m   PROVIDERS: Ignatius Specking, MD Cardiologist: Yates Decamp, MD Vascular: Lemar Livings, MD Pulmonology: Dr.  Tonia Brooms  LABS: Labs reviewed: Acceptable for surgery. (all labs ordered are listed, but only abnormal results are displayed)  Labs Reviewed  BASIC METABOLIC PANEL - Abnormal; Notable for the following components:      Result Value   Potassium 5.2 (*)    Glucose, Bld 105 (*)    BUN 33 (*)    All other components within normal limits  SURGICAL PCR SCREEN  CBC  TYPE AND SCREEN     IMAGES:  CT Chest 02/23/23:  IMPRESSION: Lung-RADS 3, probably benign findings. Short-term follow-up in 6 months is recommended with repeat low-dose chest CT without contrast (please use the following order, CT CHEST LCS NODULE FOLLOW-UP W/O CM).   Improving irregular right upper lobe opacity, non FDG avid on prior PET, as above. Follow-up low-dose lung cancer screening CT chest is suggested in 6 months.   Aortic Atherosclerosis (ICD10-I70.0).   EKG 10/27/2022  -Marked sinus bradycardia at the rate of 49 bpm.  Normal axis, no evidence of ischemia.  Normal EKG.   CV:  Echocardiogram 10/19/2022:    1. Left ventricular ejection fraction, by estimation, is 60 to 65%. The left ventricle has normal function. The left ventricle has no regional wall motion abnormalities. Left ventricular diastolic parameters were normal.  2. Right ventricular systolic function is normal. The right ventricular size is normal.  3. The mitral valve is normal in structure. No evidence of mitral valve regurgitation.  No evidence of mitral stenosis.  4. The aortic valve is normal in structure. Aortic valve regurgitation is mild. No aortic stenosis is present.  5. The inferior vena cava is normal in size with greater than 50% respiratory variability, suggesting right atrial pressure of 3 mmHg.   Left Heart Catheterization 10/19/22:   LV: LV normal, LVEF 45% with inferior akinesis.  No significant regurgitation. LM: Large-caliber vessel.  Smooth and normal. LAD: Gives origin to large D1 and D2.  Mild disease is noted in the  midsegment of the LAD. Cx: Small, mid segment has a 30% mild disease, resulting to large OM1 and OM 2. RCA: Large-caliber vessel and a dominant vessel.  Has moderate tortuosity in the proximal segment at the crux.  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  There is distal embolization in the PL and PDA branches.   Intervention data: Successful aspiration thrombectomy using priority 1 thrombectomy catheter followed by direct stenting with 4.0 x 16 mm Synergy XD at 20 atmospheric pressure at the proximal circumflex at the proximal, 2 to the proximal edge dissection, covered with a 4.5 by 12 mm  Synergy XD deployed at 13 atm pressure for 30 seconds then postdilated with the same stent balloon at 16 pressure pressure between the stent starts.   Recommendation: Patient will need dual antiplatelet therapy with aspirin and Brilinta for a period of 1 year.  I will start him on cangrelor infusion until infusion is done, patient did receive 180 mg of Brilinta in the Cath Lab.  150 mcg utilized.  Carotid Duplex 02/22/23:  Summary:  Right Carotid: Velocities in the right ICA are consistent with a 1-39%  stenosis.   Left Carotid: Velocities in the left ICA are consistent with a 1-39%  stenosis.   Vertebrals: Bilateral vertebral arteries demonstrate antegrade flow.   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   Pneumonia    May 2023    Past Surgical History:  Procedure Laterality Date   APPENDECTOMY     BACK SURGERY  01/12/2016   T 5&6   BACK SURGERY  04/2016   T 6-9   BACK SURGERY     Feb 2022   CATARACT EXTRACTION Bilateral    COLONOSCOPY N/A 01/06/2016   Procedure: COLONOSCOPY;  Surgeon: Malissa Hippo, MD;  Location: [facility];  Service: Endoscopy;  Laterality: N/A;  730   CORONARY THROMBECTOMY N/A 10/19/2022   Procedure: Coronary Thrombectomy;  Surgeon: Yates Decamp, MD;  Location: [facility] INVASIVE CV LAB;  Service: Cardiovascular;  Laterality: N/A;   CORONARY/GRAFT ACUTE MI REVASCULARIZATION N/A 10/19/2022   Procedure: Coronary/Graft Acute MI Revascularization;  Surgeon: Yates Decamp, MD;  Location: [facility] INVASIVE CV LAB;  Service: Cardiovascular;  Laterality: N/A;   ENDARTERECTOMY Left 04/19/2022   Procedure: LEFT CAROTID ENDARTERECTOMY;  Surgeon: Maeola Harman, MD;  Location: [facility] OR;  Service: Vascular;  Laterality: Left;   HAND SURGERY Left    KNEE ARTHROSCOPY Left 2006   LEFT HEART CATH AND CORONARY ANGIOGRAPHY N/A 10/19/2022   Procedure: LEFT HEART CATH AND CORONARY ANGIOGRAPHY;  Surgeon: Yates Decamp, MD;  Location: [facility] INVASIVE CV  LAB;  Service: Cardiovascular;  Laterality: N/A;   PATCH ANGIOPLASTY Left 04/19/2022   Procedure: PATCH ANGIOPLASTY using Livia Snellen Biologic Patch;  Surgeon: Maeola Harman, MD;  Location: [facility] OR;  Service: Vascular;  Laterality: Left;   TRANSFORAMINAL LUMBAR INTERBODY FUSION W/ MIS 2 LEVEL N/A 06/20/2022   Procedure: Lumbar three-four, Lumbar four-five Laminectomies/resection of synovial cyst Minimally Invasive Surgery ,Transforaminal Lumbar interbody Fusion lumbar three-four lumbar four-five;  Surgeon: Jadene Pierini, MD;  Location: [facility] OR;  Service: Neurosurgery;  Laterality: N/A;    MEDICATIONS:  aspirin EC 81 MG tablet   atorvastatin (LIPITOR) 20 MG tablet   Bempedoic Acid-Ezetimibe (NEXLIZET) 180-10 MG TABS   cyclobenzaprine (FLEXERIL) 10 MG tablet   diclofenac (VOLTAREN) 75 MG EC tablet   gabapentin (NEURONTIN) 300 MG capsule   HYDROcodone-acetaminophen (NORCO/VICODIN) 5-325 MG tablet   lisinopril (ZESTRIL) 20 MG tablet   metoprolol succinate (TOPROL-XL) 25 MG 24 hr tablet   nitroGLYCERIN (NITROSTAT) 0.4 MG SL tablet    prasugrel (EFFIENT) 10 MG TABS tablet   traMADol (ULTRAM) 50 MG tablet   Vitamin D, Ergocalciferol, (DRISDOL) 1.25 MG (50000 UNIT) CAPS capsule   No current facility-administered medications for this encounter.   Marcille Blanco [facility] Surgical Short Stay/Anesthesiology [facility] Phone [phone] 03/22/2023 11:52 AM

## 2023-03-22 NOTE — Anesthesia Preprocedure Evaluation (Addendum)
Anesthesia Evaluation  Patient identified by MRN, date of birth, ID band Patient awake    Reviewed: Allergy & Precautions, NPO status , Patient's Chart, lab work & pertinent test results  Airway Mallampati: II  TM Distance: >3 FB Neck ROM: Full    Dental  (+) Dental Advisory Given   Pulmonary COPD, Current Smoker   breath sounds clear to auscultation       Cardiovascular hypertension, Pt. on medications + CAD, + Past MI and + Cardiac Stents   Rhythm:Regular Rate:Normal     Neuro/Psych negative neurological ROS     GI/Hepatic Neg liver ROS,GERD  ,,  Endo/Other  negative endocrine ROS    Renal/GU negative Renal ROS     Musculoskeletal  (+) Arthritis ,    Abdominal   Peds  Hematology  (+) Blood dyscrasia (Last dose Effient 6/19)   Anesthesia Other Findings   Reproductive/Obstetrics                             Anesthesia Physical Anesthesia Plan  ASA: 3  Anesthesia Plan: General   Post-op Pain Management: Ofirmev IV (intra-op)*   Induction: Intravenous  PONV Risk Score and Plan: 1 and Dexamethasone, Ondansetron and Treatment may vary due to age or medical condition  Airway Management Planned: Oral ETT  Additional Equipment: None  Intra-op Plan:   Post-operative Plan: Extubation in OR  Informed Consent: I have reviewed the patients History and Physical, chart, labs and discussed the procedure including the risks, benefits and alternatives for the proposed anesthesia with the patient or authorized representative who has indicated his/her understanding and acceptance.     Dental advisory given  Plan Discussed with: CRNA  Anesthesia Plan Comments: ( )        Anesthesia Quick Evaluation

## 2023-04-03 ENCOUNTER — Other Ambulatory Visit: Payer: Self-pay

## 2023-04-03 ENCOUNTER — Ambulatory Visit (HOSPITAL_COMMUNITY): Payer: Medicare Other

## 2023-04-03 ENCOUNTER — Ambulatory Visit (HOSPITAL_COMMUNITY): Payer: Medicare Other | Admitting: Medical

## 2023-04-03 ENCOUNTER — Encounter (HOSPITAL_COMMUNITY)
Admission: RE | Disposition: A | Discharge status: Home / Self Care | Payer: Self-pay | Source: Ambulatory Visit | Attending: Orthopedic Surgery

## 2023-04-03 ENCOUNTER — Encounter (HOSPITAL_COMMUNITY): Payer: Self-pay | Admitting: Orthopedic Surgery

## 2023-04-03 ENCOUNTER — Observation Stay (HOSPITAL_COMMUNITY)
Admission: RE | Admit: 2023-04-03 | Discharge: 2023-04-04 | Disposition: A | Discharge status: Home / Self Care | Payer: Medicare Other | Source: Ambulatory Visit | Attending: Orthopedic Surgery | Admitting: Orthopedic Surgery

## 2023-04-03 ENCOUNTER — Ambulatory Visit (HOSPITAL_BASED_OUTPATIENT_CLINIC_OR_DEPARTMENT_OTHER): Payer: Medicare Other | Admitting: Registered Nurse

## 2023-04-03 ENCOUNTER — Observation Stay (HOSPITAL_COMMUNITY): Payer: Medicare Other

## 2023-04-03 DIAGNOSIS — Z7982 Long term (current) use of aspirin: Secondary | ICD-10-CM | POA: Diagnosis not present

## 2023-04-03 DIAGNOSIS — M1611 Unilateral primary osteoarthritis, right hip: Principal | ICD-10-CM | POA: Diagnosis present

## 2023-04-03 DIAGNOSIS — J449 Chronic obstructive pulmonary disease, unspecified: Secondary | ICD-10-CM | POA: Insufficient documentation

## 2023-04-03 DIAGNOSIS — F17210 Nicotine dependence, cigarettes, uncomplicated: Secondary | ICD-10-CM

## 2023-04-03 DIAGNOSIS — I2510 Atherosclerotic heart disease of native coronary artery without angina pectoris: Secondary | ICD-10-CM

## 2023-04-03 DIAGNOSIS — Z96641 Presence of right artificial hip joint: Secondary | ICD-10-CM | POA: Diagnosis not present

## 2023-04-03 DIAGNOSIS — Z471 Aftercare following joint replacement surgery: Secondary | ICD-10-CM | POA: Diagnosis not present

## 2023-04-03 DIAGNOSIS — Z01818 Encounter for other preprocedural examination: Secondary | ICD-10-CM

## 2023-04-03 DIAGNOSIS — I10 Essential (primary) hypertension: Secondary | ICD-10-CM

## 2023-04-03 DIAGNOSIS — M169 Osteoarthritis of hip, unspecified: Secondary | ICD-10-CM | POA: Diagnosis present

## 2023-04-03 DIAGNOSIS — Z79899 Other long term (current) drug therapy: Secondary | ICD-10-CM | POA: Diagnosis not present

## 2023-04-03 HISTORY — PX: TOTAL HIP ARTHROPLASTY: SHX124

## 2023-04-03 SURGERY — ARTHROPLASTY, HIP, TOTAL, ANTERIOR APPROACH
Anesthesia: General | Site: Hip | Laterality: Right

## 2023-04-03 MED ORDER — LIDOCAINE HCL (PF) 2 % IJ SOLN
INTRAMUSCULAR | Status: AC
  Filled 2023-04-03: qty 5

## 2023-04-03 MED ORDER — MIDAZOLAM HCL 2 MG/2ML IJ SOLN
INTRAMUSCULAR | Status: AC
  Filled 2023-04-03: qty 2

## 2023-04-03 MED ORDER — DEXAMETHASONE SODIUM PHOSPHATE 10 MG/ML IJ SOLN
8.0000 mg | Freq: Once | INTRAMUSCULAR | Status: AC
  Administered 2023-04-03: 8 mg via INTRAVENOUS

## 2023-04-03 MED ORDER — MIDAZOLAM HCL 5 MG/5ML IJ SOLN
INTRAMUSCULAR | Status: DC | PRN
  Administered 2023-04-03: 2 mg via INTRAVENOUS

## 2023-04-03 MED ORDER — SODIUM CHLORIDE 0.9 % IV SOLN: INTRAVENOUS | Status: DC

## 2023-04-03 MED ORDER — TRAMADOL HCL 50 MG PO TABS: 50.0000 mg | ORAL_TABLET | Freq: Four times a day (QID) | ORAL | Status: DC | PRN

## 2023-04-03 MED ORDER — ROCURONIUM BROMIDE 10 MG/ML (PF) SYRINGE
PREFILLED_SYRINGE | INTRAVENOUS | Status: DC | PRN
  Administered 2023-04-03: 60 mg via INTRAVENOUS

## 2023-04-03 MED ORDER — ONDANSETRON HCL 4 MG/2ML IJ SOLN
INTRAMUSCULAR | Status: DC | PRN
  Administered 2023-04-03: 4 mg via INTRAVENOUS

## 2023-04-03 MED ORDER — GABAPENTIN 300 MG PO CAPS
600.0000 mg | ORAL_CAPSULE | Freq: Two times a day (BID) | ORAL | Status: DC
  Administered 2023-04-03 – 2023-04-04 (×2): 600 mg via ORAL
  Filled 2023-04-03 (×2): qty 2

## 2023-04-03 MED ORDER — DEXAMETHASONE SODIUM PHOSPHATE 10 MG/ML IJ SOLN
10.0000 mg | Freq: Once | INTRAMUSCULAR | Status: AC
  Administered 2023-04-04: 10 mg via INTRAVENOUS
  Filled 2023-04-03: qty 1

## 2023-04-03 MED ORDER — FENTANYL CITRATE (PF) 100 MCG/2ML IJ SOLN
INTRAMUSCULAR | Status: AC
  Filled 2023-04-03: qty 2

## 2023-04-03 MED ORDER — OXYCODONE HCL 5 MG PO TABS
5.0000 mg | ORAL_TABLET | ORAL | Status: DC | PRN
  Administered 2023-04-04 (×2): 5 mg via ORAL
  Filled 2023-04-03 (×2): qty 1

## 2023-04-03 MED ORDER — EPHEDRINE 5 MG/ML INJ
INTRAVENOUS | Status: AC
  Filled 2023-04-03: qty 5

## 2023-04-03 MED ORDER — LIDOCAINE 2% (20 MG/ML) 5 ML SYRINGE
INTRAMUSCULAR | Status: DC | PRN
  Administered 2023-04-03: 60 mg via INTRAVENOUS

## 2023-04-03 MED ORDER — FENTANYL CITRATE PF 50 MCG/ML IJ SOSY
PREFILLED_SYRINGE | INTRAMUSCULAR | Status: AC
  Filled 2023-04-03: qty 3

## 2023-04-03 MED ORDER — BUPIVACAINE HCL 0.25 % IJ SOLN
INTRAMUSCULAR | Status: AC
  Filled 2023-04-03: qty 1

## 2023-04-03 MED ORDER — FENTANYL CITRATE PF 50 MCG/ML IJ SOSY
25.0000 ug | PREFILLED_SYRINGE | INTRAMUSCULAR | Status: DC | PRN
  Administered 2023-04-03 (×2): 50 ug via INTRAVENOUS

## 2023-04-03 MED ORDER — ACETAMINOPHEN 325 MG PO TABS: 325.0000 mg | ORAL_TABLET | Freq: Four times a day (QID) | ORAL | Status: DC | PRN

## 2023-04-03 MED ORDER — LACTATED RINGERS IV SOLN: INTRAVENOUS | Status: DC

## 2023-04-03 MED ORDER — POVIDONE-IODINE 10 % EX SWAB: 2.0000 | Freq: Once | CUTANEOUS | Status: DC

## 2023-04-03 MED ORDER — PHENYLEPHRINE 80 MCG/ML (10ML) SYRINGE FOR IV PUSH (FOR BLOOD PRESSURE SUPPORT)
PREFILLED_SYRINGE | INTRAVENOUS | Status: DC | PRN
  Administered 2023-04-03: 80 ug via INTRAVENOUS

## 2023-04-03 MED ORDER — ONDANSETRON HCL 4 MG PO TABS: 4.0000 mg | ORAL_TABLET | Freq: Four times a day (QID) | ORAL | Status: DC | PRN

## 2023-04-03 MED ORDER — MENTHOL 3 MG MT LOZG: 1.0000 | LOZENGE | OROMUCOSAL | Status: DC | PRN

## 2023-04-03 MED ORDER — CHLORHEXIDINE GLUCONATE 0.12 % MT SOLN
15.0000 mL | Freq: Once | OROMUCOSAL | Status: AC
  Administered 2023-04-03: 15 mL via OROMUCOSAL

## 2023-04-03 MED ORDER — EPINEPHRINE PF 1 MG/ML IJ SOLN
INTRAMUSCULAR | Status: AC
  Filled 2023-04-03: qty 1

## 2023-04-03 MED ORDER — ATORVASTATIN CALCIUM 10 MG PO TABS
10.0000 mg | ORAL_TABLET | ORAL | Status: DC
  Administered 2023-04-04: 10 mg via ORAL
  Filled 2023-04-03: qty 1

## 2023-04-03 MED ORDER — TRANEXAMIC ACID-NACL 1000-0.7 MG/100ML-% IV SOLN
1000.0000 mg | INTRAVENOUS | Status: AC
  Administered 2023-04-03: 1000 mg via INTRAVENOUS
  Filled 2023-04-03: qty 100

## 2023-04-03 MED ORDER — METHOCARBAMOL 1000 MG/10ML IJ SOLN: 500.0000 mg | Freq: Four times a day (QID) | INTRAVENOUS | Status: DC | PRN

## 2023-04-03 MED ORDER — DEXAMETHASONE SODIUM PHOSPHATE 10 MG/ML IJ SOLN
INTRAMUSCULAR | Status: AC
  Filled 2023-04-03: qty 1

## 2023-04-03 MED ORDER — EPHEDRINE SULFATE-NACL 50-0.9 MG/10ML-% IV SOSY
PREFILLED_SYRINGE | INTRAVENOUS | Status: DC | PRN
  Administered 2023-04-03 (×3): 5 mg via INTRAVENOUS

## 2023-04-03 MED ORDER — ROCURONIUM BROMIDE 10 MG/ML (PF) SYRINGE
PREFILLED_SYRINGE | INTRAVENOUS | Status: AC
  Filled 2023-04-03: qty 10

## 2023-04-03 MED ORDER — METOCLOPRAMIDE HCL 5 MG/ML IJ SOLN: 5.0000 mg | Freq: Three times a day (TID) | INTRAMUSCULAR | Status: DC | PRN

## 2023-04-03 MED ORDER — PROPOFOL 500 MG/50ML IV EMUL
INTRAVENOUS | Status: DC | PRN
  Administered 2023-04-03: 25 ug/kg/min via INTRAVENOUS

## 2023-04-03 MED ORDER — METOPROLOL SUCCINATE ER 25 MG PO TB24
25.0000 mg | ORAL_TABLET | Freq: Every day | ORAL | Status: DC
  Administered 2023-04-04: 25 mg via ORAL
  Filled 2023-04-03: qty 1

## 2023-04-03 MED ORDER — CEFAZOLIN SODIUM-DEXTROSE 2-4 GM/100ML-% IV SOLN
2.0000 g | INTRAVENOUS | Status: AC
  Administered 2023-04-03: 2 g via INTRAVENOUS
  Filled 2023-04-03: qty 100

## 2023-04-03 MED ORDER — ONDANSETRON HCL 4 MG/2ML IJ SOLN: 4.0000 mg | Freq: Four times a day (QID) | INTRAMUSCULAR | Status: DC | PRN

## 2023-04-03 MED ORDER — PHENYLEPHRINE 80 MCG/ML (10ML) SYRINGE FOR IV PUSH (FOR BLOOD PRESSURE SUPPORT)
PREFILLED_SYRINGE | INTRAVENOUS | Status: AC
  Filled 2023-04-03: qty 10

## 2023-04-03 MED ORDER — CEFAZOLIN SODIUM-DEXTROSE 2-4 GM/100ML-% IV SOLN
2.0000 g | Freq: Four times a day (QID) | INTRAVENOUS | Status: AC
  Administered 2023-04-03 – 2023-04-04 (×2): 2 g via INTRAVENOUS
  Filled 2023-04-03 (×2): qty 100

## 2023-04-03 MED ORDER — HYDROMORPHONE HCL 1 MG/ML IJ SOLN
0.5000 mg | INTRAMUSCULAR | Status: DC | PRN
  Administered 2023-04-03: 0.5 mg via INTRAVENOUS
  Filled 2023-04-03: qty 1

## 2023-04-03 MED ORDER — BUPIVACAINE HCL 0.25 % IJ SOLN
INTRAMUSCULAR | Status: DC | PRN
  Administered 2023-04-03: 30 mL

## 2023-04-03 MED ORDER — PHENYLEPHRINE HCL-NACL 20-0.9 MG/250ML-% IV SOLN
INTRAVENOUS | Status: DC | PRN
  Administered 2023-04-03: 20 ug/min via INTRAVENOUS

## 2023-04-03 MED ORDER — WATER FOR IRRIGATION, STERILE IR SOLN
Status: DC | PRN
  Administered 2023-04-03: 1000 mL

## 2023-04-03 MED ORDER — ORAL CARE MOUTH RINSE: 15.0000 mL | Freq: Once | OROMUCOSAL | Status: AC

## 2023-04-03 MED ORDER — ONDANSETRON HCL 4 MG/2ML IJ SOLN
INTRAMUSCULAR | Status: AC
  Filled 2023-04-03: qty 2

## 2023-04-03 MED ORDER — METOCLOPRAMIDE HCL 5 MG PO TABS: 5.0000 mg | ORAL_TABLET | Freq: Three times a day (TID) | ORAL | Status: DC | PRN

## 2023-04-03 MED ORDER — 0.9 % SODIUM CHLORIDE (POUR BTL) OPTIME
TOPICAL | Status: DC | PRN
  Administered 2023-04-03: 1000 mL

## 2023-04-03 MED ORDER — POLYETHYLENE GLYCOL 3350 17 G PO PACK: 17.0000 g | PACK | Freq: Every day | ORAL | Status: DC | PRN

## 2023-04-03 MED ORDER — NITROGLYCERIN 0.4 MG SL SUBL: 0.4000 mg | SUBLINGUAL_TABLET | SUBLINGUAL | Status: DC | PRN

## 2023-04-03 MED ORDER — BISACODYL 10 MG RE SUPP: 10.0000 mg | Freq: Every day | RECTAL | Status: DC | PRN

## 2023-04-03 MED ORDER — PROPOFOL 1000 MG/100ML IV EMUL
INTRAVENOUS | Status: AC
  Filled 2023-04-03: qty 100

## 2023-04-03 MED ORDER — AMISULPRIDE (ANTIEMETIC) 5 MG/2ML IV SOLN: 10.0000 mg | Freq: Once | INTRAVENOUS | Status: DC | PRN

## 2023-04-03 MED ORDER — DOCUSATE SODIUM 100 MG PO CAPS
100.0000 mg | ORAL_CAPSULE | Freq: Two times a day (BID) | ORAL | Status: DC
  Administered 2023-04-03 – 2023-04-04 (×2): 100 mg via ORAL
  Filled 2023-04-03 (×2): qty 1

## 2023-04-03 MED ORDER — PHENOL 1.4 % MT LIQD: 1.0000 | OROMUCOSAL | Status: DC | PRN

## 2023-04-03 MED ORDER — ACETAMINOPHEN 10 MG/ML IV SOLN
1000.0000 mg | Freq: Four times a day (QID) | INTRAVENOUS | Status: DC
  Administered 2023-04-03: 1000 mg via INTRAVENOUS
  Filled 2023-04-03: qty 100

## 2023-04-03 MED ORDER — METHOCARBAMOL 500 MG PO TABS
500.0000 mg | ORAL_TABLET | Freq: Four times a day (QID) | ORAL | Status: DC | PRN
  Administered 2023-04-03 – 2023-04-04 (×2): 500 mg via ORAL
  Filled 2023-04-03 (×2): qty 1

## 2023-04-03 MED ORDER — PROPOFOL 10 MG/ML IV BOLUS
INTRAVENOUS | Status: DC | PRN
  Administered 2023-04-03: 170 mg via INTRAVENOUS

## 2023-04-03 MED ORDER — ASPIRIN 325 MG PO TBEC
325.0000 mg | DELAYED_RELEASE_TABLET | Freq: Every day | ORAL | Status: DC
  Administered 2023-04-04: 325 mg via ORAL
  Filled 2023-04-03: qty 1

## 2023-04-03 MED ORDER — MAGNESIUM CITRATE PO SOLN: 1.0000 | Freq: Once | ORAL | Status: DC | PRN

## 2023-04-03 MED ORDER — PRASUGREL HCL 10 MG PO TABS
10.0000 mg | ORAL_TABLET | Freq: Every day | ORAL | Status: DC
  Administered 2023-04-04: 10 mg via ORAL
  Filled 2023-04-03: qty 1

## 2023-04-03 MED ORDER — FENTANYL CITRATE (PF) 100 MCG/2ML IJ SOLN
INTRAMUSCULAR | Status: DC | PRN
  Administered 2023-04-03: 25 ug via INTRAVENOUS
  Administered 2023-04-03: 100 ug via INTRAVENOUS
  Administered 2023-04-03: 25 ug via INTRAVENOUS

## 2023-04-03 SURGICAL SUPPLY — 44 items
ADH SKN CLS APL DERMABOND .7 (GAUZE/BANDAGES/DRESSINGS) ×1
ADH SKN CLS LQ APL DERMABOND (GAUZE/BANDAGES/DRESSINGS) ×1
ADH SKNCLS APL OCTYL .7 VIOL (GAUZE/BANDAGES/DRESSINGS) ×1
ADH SKNCLS LQ APL MCBL BRR (GAUZE/BANDAGES/DRESSINGS) ×1
BAG COUNTER SPONGE SURGICOUNT (BAG) IMPLANT
BAG SPEC THK2 15X12 ZIP CLS (MISCELLANEOUS)
BAG SPNG CNTER NS LX DISP (BAG)
BAG ZIPLOCK 12X15 (MISCELLANEOUS) IMPLANT
BLADE SAG 18X100X1.27 (BLADE) ×2 IMPLANT
COVER PERINEAL POST (MISCELLANEOUS) ×2 IMPLANT
COVER SURGICAL LIGHT HANDLE (MISCELLANEOUS) ×2 IMPLANT
CUP ACETBLR 54 OD PINNACLE (Hips) IMPLANT
DERMABOND ADVANCED .7 DNX12 (GAUZE/BANDAGES/DRESSINGS) ×2 IMPLANT
DERMABOND ADVANCED .7 DNX6 (GAUZE/BANDAGES/DRESSINGS) IMPLANT
DRAPE FOOT SWITCH (DRAPES) ×2 IMPLANT
DRAPE STERI IOBAN 125X83 (DRAPES) ×2 IMPLANT
DRAPE U-SHAPE 47X51 STRL (DRAPES) ×4 IMPLANT
DRSG AQUACEL AG ADV 3.5X10 (GAUZE/BANDAGES/DRESSINGS) ×2 IMPLANT
DURAPREP 26ML APPLICATOR (WOUND CARE) ×2 IMPLANT
ELECT REM PT RETURN 15FT ADLT (MISCELLANEOUS) ×2 IMPLANT
GLOVE BIO SURGEON STRL SZ 6.5 (GLOVE) IMPLANT
GLOVE BIO SURGEON STRL SZ8 (GLOVE) ×2 IMPLANT
GLOVE BIOGEL PI IND STRL 6.5 (GLOVE) IMPLANT
GLOVE BIOGEL PI IND STRL 7.0 (GLOVE) IMPLANT
GLOVE BIOGEL PI IND STRL 8 (GLOVE) ×2 IMPLANT
GOWN STRL REUS W/ TWL LRG LVL3 (GOWN DISPOSABLE) ×2 IMPLANT
GOWN STRL REUS W/TWL LRG LVL3 (GOWN DISPOSABLE) ×1
HEAD CERAMIC 36 PLUS5 (Hips) IMPLANT
HOLDER FOLEY CATH W/STRAP (MISCELLANEOUS) ×2 IMPLANT
KIT TURNOVER KIT A (KITS) IMPLANT
LINER MARATHON NEUT +4X54X36 (Hips) IMPLANT
MANIFOLD NEPTUNE II (INSTRUMENTS) ×2 IMPLANT
PACK ANTERIOR HIP CUSTOM (KITS) ×2 IMPLANT
PENCIL SMOKE EVACUATOR COATED (MISCELLANEOUS) ×2 IMPLANT
SPIKE FLUID TRANSFER (MISCELLANEOUS) ×2 IMPLANT
STEM FEMORAL SZ6 HIGH ACTIS (Stem) IMPLANT
SUT ETHIBOND NAB CT1 #1 30IN (SUTURE) ×2 IMPLANT
SUT MNCRL AB 4-0 PS2 18 (SUTURE) ×2 IMPLANT
SUT STRATAFIX 0 PDS 27 VIOLET (SUTURE) ×1
SUT VIC AB 2-0 CT1 27 (SUTURE) ×2
SUT VIC AB 2-0 CT1 TAPERPNT 27 (SUTURE) ×4 IMPLANT
SUTURE STRATFX 0 PDS 27 VIOLET (SUTURE) ×2 IMPLANT
TRAY FOLEY MTR SLVR 16FR STAT (SET/KITS/TRAYS/PACK) ×2 IMPLANT
TUBE SUCTION HIGH CAP CLEAR NV (SUCTIONS) ×2 IMPLANT

## 2023-04-03 NOTE — Plan of Care (Signed)
  Problem: Education: Goal: Understanding of CV disease, CV risk reduction, and recovery process will improve Outcome: Progressing   Problem: Activity: Goal: Ability to return to baseline activity level will improve Outcome: Progressing   Problem: Education: Goal: Knowledge of the prescribed therapeutic regimen will improve Outcome: Progressing   Problem: Activity: Goal: Ability to avoid complications of mobility impairment will improve Outcome: Progressing   Problem: Skin Integrity: Goal: Will show signs of wound healing Outcome: Progressing   Problem: Education: Goal: Knowledge of General Education information will improve Description: Including pain rating scale, medication(s)/side effects and non-pharmacologic comfort measures Outcome: Progressing   Problem: Nutrition: Goal: Adequate nutrition will be maintained Outcome: Progressing   Problem: Elimination: Goal: Will not experience complications related to bowel motility Outcome: Progressing   Problem: Pain Managment: Goal: General experience of comfort will improve Outcome: Progressing

## 2023-04-03 NOTE — Interval H&P Note (Signed)
History and Physical Interval Note:  04/03/2023 1:20 PM  Leon Mitchell  has presented today for surgery, with the diagnosis of right hip osteoarthritis.  The various methods of treatment have been discussed with the patient and family. After consideration of risks, benefits and other options for treatment, the patient has consented to  Procedure(s): TOTAL HIP ARTHROPLASTY ANTERIOR APPROACH (Right) as a surgical intervention.  The patient's history has been reviewed, patient examined, no change in status, stable for surgery.  I have reviewed the patient's chart and labs.  Questions were answered to the patient's satisfaction.     Homero Fellers Delrose Rohwer

## 2023-04-03 NOTE — Op Note (Signed)
OPERATIVE REPORT- TOTAL HIP ARTHROPLASTY   PREOPERATIVE DIAGNOSIS: Osteoarthritis of the Right hip.   POSTOPERATIVE DIAGNOSIS: Osteoarthritis of the Right  hip.   PROCEDURE: Right total hip arthroplasty, anterior approach.   SURGEON: Ollen Gross, MD   ASSISTANT: Arther Abbott, PA-C  ANESTHESIA:  General  ESTIMATED BLOOD LOSS:-375 mL    DRAINS: None  COMPLICATIONS: None   CONDITION: PACU - hemodynamically stable.   BRIEF CLINICAL NOTE: Leon Mitchell is a 67 y.o. male who has advanced end-  stage arthritis of their Right  hip with progressively worsening pain and  dysfunction.The patient has failed nonoperative management and presents for  total hip arthroplasty.   PROCEDURE IN DETAIL: After successful administration of spinal  anesthetic, the traction boots for the [facility] bed were placed on both  feet and the patient was placed onto the [facility] bed, boots placed into the leg  holders. The Right hip was then isolated from the perineum with plastic  drapes and prepped and draped in the usual sterile fashion. ASIS and  greater trochanter were marked and a oblique incision was made, starting  at about 1 cm lateral and 2 cm distal to the ASIS and coursing towards  the anterior cortex of the femur. The skin was cut with a 10 blade  through subcutaneous tissue to the level of the fascia overlying the  tensor fascia lata muscle. The fascia was then incised in line with the  incision at the junction of the anterior third and posterior 2/3rd. The  muscle was teased off the fascia and then the interval between the TFL  and the rectus was developed. The Hohmann retractor was then placed at  the top of the femoral neck over the capsule. The vessels overlying the  capsule were cauterized and the fat on top of the capsule was removed.  A Hohmann retractor was then placed anterior underneath the rectus  femoris to give exposure to the entire anterior capsule. A T-shaped   capsulotomy was performed. The edges were tagged and the femoral head  was identified.       Osteophytes are removed off the superior acetabulum.  The femoral neck was then cut in situ with an oscillating saw. Traction  was then applied to the left lower extremity utilizing the [facility]  traction. The femoral head was then removed. Retractors were placed  around the acetabulum and then circumferential removal of the labrum was  performed. Osteophytes were also removed. Reaming starts at 49 mm to  medialize and  Increased in 2 mm increments to 53 mm. We reamed in  approximately 40 degrees of abduction, 20 degrees anteversion. A 54 mm  pinnacle acetabular shell was then impacted in anatomic position under  fluoroscopic guidance with excellent purchase. We did not need to place  any additional dome screws. A 36 mm neutral + 4 marathon liner was then  placed into the acetabular shell.       The femoral lift was then placed along the lateral aspect of the femur  just distal to the vastus ridge. The leg was  externally rotated and capsule  was stripped off the inferior aspect of the femoral neck down to the  level of the lesser trochanter, this was done with electrocautery. The femur was lifted after this was performed. The  leg was then placed in an extended and adducted position essentially delivering the femur. We also removed the capsule superiorly and the piriformis from the piriformis fossa to  gain excellent exposure of the  proximal femur. Rongeur was used to remove some cancellous bone to get  into the lateral portion of the proximal femur for placement of the  initial starter reamer. The starter broaches was placed  the starter broach  and was shown to go down the center of the canal. Broaching  with the Actis system was then performed starting at size 0  coursing  Up to size 6. A size 6 had excellent torsional and rotational  and axial stability. The trial high offset neck was then placed   with a 36 + 5 trial head. The hip was then reduced. We confirmed that  the stem was in the canal both on AP and lateral x-rays. It also has excellent sizing. The hip was reduced with outstanding stability through full extension and full external rotation.. AP pelvis was taken and the leg lengths were measured and found to be equal. Hip was then dislocated again and the femoral head and neck removed. The  femoral broach was removed. Size 6 Actis stem with a high offset  neck was then impacted into the femur following native anteversion. Has  excellent purchase in the canal. Excellent torsional and rotational and  axial stability. It is confirmed to be in the canal on AP and lateral  fluoroscopic views. The 36 + 5 ceramic head was placed and the hip  reduced with outstanding stability. Again AP pelvis was taken and it  confirmed that the leg lengths were equal. The wound was then copiously  irrigated with saline solution and the capsule reattached and repaired  with Ethibond suture. 30 ml of .25% Bupivicaine was  injected into the capsule and into the edge of the tensor fascia lata as well as subcutaneous tissue. The fascia overlying the tensor fascia lata was then closed with a running #1 V-Loc. Subcu was closed with interrupted 2-0 Vicryl and subcuticular running 4-0 Monocryl. Incision was cleaned  and dried. Steri-Strips and a bulky sterile dressing applied. The patient was awakened and transported to  recovery in stable condition.        Please note that a surgical assistant was a medical necessity for this procedure to perform it in a safe and expeditious manner. Assistant was necessary to provide appropriate retraction of vital neurovascular structures and to prevent femoral fracture and allow for anatomic placement of the prosthesis.  Ollen Gross, M.D.

## 2023-04-03 NOTE — Anesthesia Procedure Notes (Signed)
Procedure Name: Intubation Date/Time: 04/03/2023 1:05 PM  Performed by: Elisabeth Cara, CRNAPre-anesthesia Checklist: Patient identified, Emergency Drugs available, Suction available, Patient being monitored and Timeout performed Patient Re-evaluated:Patient Re-evaluated prior to induction Oxygen Delivery Method: Circle system utilized Preoxygenation: Pre-oxygenation with 100% oxygen Induction Type: IV induction Ventilation: Mask ventilation without difficulty Laryngoscope Size: Mac and 4 Grade View: Grade I Tube type: Oral Tube size: 7.5 mm Number of attempts: 1 Airway Equipment and Method: Stylet Placement Confirmation: ETT inserted through vocal cords under direct vision, positive ETCO2 and breath sounds checked- equal and bilateral Secured at: 22 cm Tube secured with: Tape Dental Injury: Teeth and Oropharynx as per pre-operative assessment

## 2023-04-03 NOTE — Discharge Instructions (Signed)
Ollen Gross, MD Total Joint Specialist [facility] [place] [address] [place], [place] [address] [phone]  ANTERIOR APPROACH TOTAL HIP REPLACEMENT POSTOPERATIVE DIRECTIONS     Hip Rehabilitation, Guidelines Following Surgery  The results of a hip operation are greatly improved after range of motion and muscle strengthening exercises. Follow all safety measures which are given to protect your hip. If any of these exercises cause increased pain or swelling in your joint, decrease the amount until you are comfortable again. Then slowly increase the exercises. Call your caregiver if you have problems or questions.   BLOOD CLOT PREVENTION Take a 325 mg Aspirin once a day for three weeks following surgery with your usual Effient dose. Then resume one 81 mg Aspirin once a day with Effient. You may resume your vitamins/supplements upon discharge from the hospital.  HOME CARE INSTRUCTIONS  Remove items at home which could result in a fall. This includes throw rugs or furniture in walking pathways.  ICE to the affected hip as frequently as 20-30 minutes an hour and then as needed for pain and swelling. Continue to use ice on the hip for pain and swelling from surgery. You may notice swelling that will progress down to the foot and ankle. This is normal after surgery. Elevate the leg when you are not up walking on it.   Continue to use the breathing machine which will help keep your temperature down.  It is common for your temperature to cycle up and down following surgery, especially at night when you are not up moving around and exerting yourself.  The breathing machine keeps your lungs expanded and your temperature down.  DIET You may resume your previous home diet once your are discharged from the hospital.  DRESSING / WOUND CARE / SHOWERING You have an adhesive waterproof bandage over the incision. Leave this in place until your first follow-up appointment. Once  you remove this you will not need to place another bandage.  You may begin showering 3 days following surgery, but do not submerge the incision under water.  ACTIVITY For the first 3-5 days, it is important to rest and keep the operative leg elevated. You should, as a general rule, rest for 50 minutes and walk/stretch for 10 minutes per hour. After 5 days, you may slowly increase activity as tolerated.  Perform the exercises you were provided twice a day for about 15-20 minutes each session. Begin these 2 days following surgery. Walk with your walker as instructed. Use the walker until you are comfortable transitioning to a cane. Walk with the cane in the opposite hand of the operative leg. You may discontinue the cane once you are comfortable and walking steadily. Avoid periods of inactivity such as sitting longer than an hour when not asleep. This helps prevent blood clots.  Do not drive a car for 6 weeks or until released by your surgeon.  Do not drive while taking narcotics.  TED HOSE STOCKINGS Wear the elastic stockings on both legs for three weeks following surgery during the day. You may remove them at night while sleeping.  WEIGHT BEARING Weight bearing as tolerated with assist device (walker, cane, etc) as directed, use it as long as suggested by your surgeon or therapist, typically at least 4-6 weeks.  POSTOPERATIVE CONSTIPATION PROTOCOL Constipation - defined medically as fewer than three stools per week and severe constipation as less than one stool per week.  One of the most common issues patients have following surgery is  constipation.  Even if you have a regular bowel pattern at home, your normal regimen is likely to be disrupted due to multiple reasons following surgery.  Combination of anesthesia, postoperative narcotics, change in appetite and fluid intake all can affect your bowels.  In order to avoid complications following surgery, here are some recommendations in order to  help you during your recovery period.  Colace (docusate) - Pick up an over-the-counter form of Colace or another stool softener and take twice a day as long as you are requiring postoperative pain medications.  Take with a full glass of water daily.  If you experience loose stools or diarrhea, hold the colace until you stool forms back up.  If your symptoms do not get better within 1 week or if they get worse, check with your doctor. Dulcolax (bisacodyl) - Pick up over-the-counter and take as directed by the product packaging as needed to assist with the movement of your bowels.  Take with a full glass of water.  Use this product as needed if not relieved by Colace only.  MiraLax (polyethylene glycol) - Pick up over-the-counter to have on hand.  MiraLax is a solution that will increase the amount of water in your bowels to assist with bowel movements.  Take as directed and can mix with a glass of water, juice, soda, coffee, or tea.  Take if you go more than two days without a movement.Do not use MiraLax more than once per day. Call your doctor if you are still constipated or irregular after using this medication for 7 days in a row.  If you continue to have problems with postoperative constipation, please contact the office for further assistance and recommendations.  If you experience "the worst abdominal pain ever" or develop nausea or vomiting, please contact the office immediatly for further recommendations for treatment.  ITCHING  If you experience itching with your medications, try taking only a single pain pill, or even half a pain pill at a time.  You can also use Benadryl over the counter for itching or also to help with sleep.   MEDICATIONS See your medication summary on the "After Visit Summary" that the nursing staff will review with you prior to discharge.  You may have some home medications which will be placed on hold until you complete the course of blood thinner medication.  It is  important for you to complete the blood thinner medication as prescribed by your surgeon.  Continue your approved medications as instructed at time of discharge.  PRECAUTIONS If you experience chest pain or shortness of breath - call 911 immediately for transfer to the hospital emergency department.  If you develop a fever greater that 101 F, purulent drainage from wound, increased redness or drainage from wound, foul odor from the wound/dressing, or calf pain - CONTACT YOUR SURGEON.                                                   FOLLOW-UP APPOINTMENTS Make sure you keep all of your appointments after your operation with your surgeon and caregivers. You should call the office at the above phone number and make an appointment for approximately two weeks after the date of your surgery or on the date instructed by your surgeon outlined in the "After Visit Summary".  RANGE OF MOTION AND STRENGTHENING EXERCISES  These exercises are designed to help you keep full movement of your hip joint. Follow your caregiver's or physical therapist's instructions. Perform all exercises about fifteen times, three times per day or as directed. Exercise both hips, even if you have had only one joint replacement. These exercises can be done on a training (exercise) mat, on the floor, on a table or on a bed. Use whatever works the best and is most comfortable for you. Use music or television while you are exercising so that the exercises are a pleasant break in your day. This will make your life better with the exercises acting as a break in routine you can look forward to.  Lying on your back, slowly slide your foot toward your buttocks, raising your knee up off the floor. Then slowly slide your foot back down until your leg is straight again.  Lying on your back spread your legs as far apart as you can without causing discomfort.  Lying on your side, raise your upper leg and foot straight up from the floor as far as is  comfortable. Slowly lower the leg and repeat.  Lying on your back, tighten up the muscle in the front of your thigh (quadriceps muscles). You can do this by keeping your leg straight and trying to raise your heel off the floor. This helps strengthen the largest muscle supporting your knee.  Lying on your back, tighten up the muscles of your buttocks both with the legs straight and with the knee bent at a comfortable angle while keeping your heel on the floor.   POST-OPERATIVE OPIOID TAPER INSTRUCTIONS: It is important to wean off of your opioid medication as soon as possible. If you do not need pain medication after your surgery it is ok to stop day one. Opioids include: Codeine, Hydrocodone(Norco, Vicodin), Oxycodone(Percocet, oxycontin) and hydromorphone amongst others.  Long term and even short term use of opiods can cause: Increased pain response Dependence Constipation Depression Respiratory depression And more.  Withdrawal symptoms can include Flu like symptoms Nausea, vomiting And more Techniques to manage these symptoms Hydrate well Eat regular healthy meals Stay active Use relaxation techniques(deep breathing, meditating, yoga) Do Not substitute Alcohol to help with tapering If you have been on opioids for less than two weeks and do not have pain than it is ok to stop all together.  Plan to wean off of opioids This plan should start within one week post op of your joint replacement. Maintain the same interval or time between taking each dose and first decrease the dose.  Cut the total daily intake of opioids by one tablet each day Next start to increase the time between doses. The last dose that should be eliminated is the evening dose.   IF YOU ARE TRANSFERRED TO A SKILLED REHAB FACILITY If the patient is transferred to a skilled rehab facility following release from the hospital, a list of the current medications will be sent to the facility for the patient to continue.   When discharged from the skilled rehab facility, please have the facility set up the patient's Home Health Physical Therapy prior to being released. Also, the skilled facility will be responsible for providing the patient with their medications at time of release from the facility to include their pain medication, the muscle relaxants, and their blood thinner medication. If the patient is still at the rehab facility at time of the two week follow up appointment, the skilled rehab facility will also need to assist the  patient in arranging follow up appointment in our office and any transportation needs.  MAKE SURE YOU:  Understand these instructions.  Get help right away if you are not doing well or get worse.    DENTAL ANTIBIOTICS:  In most cases prophylactic antibiotics for Dental procdeures after total joint surgery are not necessary.  Exceptions are as follows:  1. History of prior total joint infection  2. Severely immunocompromised (Organ Transplant, cancer chemotherapy, Rheumatoid biologic meds such as Humera)  3. Poorly controlled diabetes (A1C &gt; 8.0, blood glucose over 200)  If you have one of these conditions, contact your surgeon for an antibiotic prescription, prior to your dental procedure.    Pick up stool softner and laxative for home use following surgery while on pain medications. Do not submerge incision under water. Please use good hand washing techniques while changing dressing each day. May shower starting three days after surgery. Please use a clean towel to pat the incision dry following showers. Continue to use ice for pain and swelling after surgery. Do not use any lotions or creams on the incision until instructed by your surgeon.

## 2023-04-03 NOTE — Transfer of Care (Signed)
Immediate Anesthesia Transfer of Care Note  Patient: Leon Mitchell  Procedure(s) Performed: TOTAL HIP ARTHROPLASTY ANTERIOR APPROACH (Right: Hip)  Patient Location: PACU  Anesthesia Type:GA combined with regional for post-op pain  Level of Consciousness: awake and alert   Airway & Oxygen Therapy: Patient Spontanous Breathing and Patient connected to face mask oxygen  Post-op Assessment: Report given to RN and Post -op Vital signs reviewed and stable  Post vital signs: Reviewed and stable  Last Vitals:  Vitals Value Taken Time  BP 140/78 04/03/23 1433  Temp    Pulse 58 04/03/23 1435  Resp 15 04/03/23 1435  SpO2 100 % 04/03/23 1435  Vitals shown include unvalidated device data.  Last Pain:  Vitals:   04/03/23 1209  TempSrc:   PainSc: 3          Complications: No notable events documented.

## 2023-04-03 NOTE — Care Plan (Signed)
Ortho Bundle Case Management Note  Patient Details  Name: Leon Mitchell MRN: [id number] Date of Birth: Oct 29, 1955                  R THA on 04-03-23  DCP: Home with girlfriend  DME: No needs, has a RW  PT: HEP   DME Arranged:  N/A DME Agency:      Additional Comments: Please contact me with any questions of if this plan should need to change.    Chong Sicilian, RN, Case Manager  [facility]  [phone] 04/03/2023, 2:34 PM

## 2023-04-03 NOTE — Anesthesia Postprocedure Evaluation (Signed)
Anesthesia Post Note  Patient: Leon Mitchell  Procedure(s) Performed: TOTAL HIP ARTHROPLASTY ANTERIOR APPROACH (Right: Hip)     Patient location during evaluation: PACU Anesthesia Type: General Level of consciousness: awake and alert Pain management: pain level controlled Vital Signs Assessment: post-procedure vital signs reviewed and stable Respiratory status: spontaneous breathing, nonlabored ventilation, respiratory function stable and patient connected to nasal cannula oxygen Cardiovascular status: blood pressure returned to baseline and stable Postop Assessment: no apparent nausea or vomiting Anesthetic complications: no  No notable events documented.  Last Vitals:  Vitals:   04/03/23 1545 04/03/23 1600  BP: 139/68 133/72  Pulse: (!) 57 (!) 59  Resp: 12 12  Temp:    SpO2: 99% 99%    Last Pain:  Vitals:   04/03/23 1554  TempSrc:   PainSc: 6                  Kennieth Rad

## 2023-04-04 ENCOUNTER — Encounter (HOSPITAL_COMMUNITY): Payer: Self-pay | Admitting: Orthopedic Surgery

## 2023-04-04 DIAGNOSIS — I2510 Atherosclerotic heart disease of native coronary artery without angina pectoris: Secondary | ICD-10-CM | POA: Diagnosis not present

## 2023-04-04 DIAGNOSIS — M1611 Unilateral primary osteoarthritis, right hip: Secondary | ICD-10-CM | POA: Diagnosis not present

## 2023-04-04 DIAGNOSIS — I10 Essential (primary) hypertension: Secondary | ICD-10-CM | POA: Diagnosis not present

## 2023-04-04 DIAGNOSIS — Z79899 Other long term (current) drug therapy: Secondary | ICD-10-CM | POA: Diagnosis not present

## 2023-04-04 DIAGNOSIS — J449 Chronic obstructive pulmonary disease, unspecified: Secondary | ICD-10-CM | POA: Diagnosis not present

## 2023-04-04 DIAGNOSIS — Z7982 Long term (current) use of aspirin: Secondary | ICD-10-CM | POA: Diagnosis not present

## 2023-04-04 LAB — BASIC METABOLIC PANEL
Anion gap: 8 (ref 5–15)
BUN: 28 mg/dL — ABNORMAL HIGH (ref 8–23)
CO2: 26 mmol/L (ref 22–32)
Calcium: 8.6 mg/dL — ABNORMAL LOW (ref 8.9–10.3)
Chloride: 103 mmol/L (ref 98–111)
Creatinine, Ser: 0.99 mg/dL (ref 0.61–1.24)
GFR, Estimated: >60 mL/min (ref >60)
Glucose, Bld: 131 mg/dL — ABNORMAL HIGH (ref 70–99)
Potassium: 4.2 mmol/L (ref 3.5–5.1)
Sodium: 137 mmol/L (ref 135–145)

## 2023-04-04 LAB — CBC
HCT: 42.8 % (ref 39.0–52.0)
Hemoglobin: 14.1 g/dL (ref 13.0–17.0)
MCH: 31.3 pg (ref 26.0–34.0)
MCHC: 32.9 g/dL (ref 30.0–36.0)
MCV: 95.1 fL (ref 80.0–100.0)
Platelets: 204 10*3/uL (ref 150–400)
RBC: 4.5 MIL/uL (ref 4.22–5.81)
RDW: 14.5 % (ref 11.5–15.5)
WBC: 10.3 10*3/uL (ref 4.0–10.5)
nRBC: 0 % (ref 0.0–0.2)

## 2023-04-04 MED ORDER — OXYCODONE HCL 5 MG PO TABS: 5.0000 mg | ORAL_TABLET | Freq: Four times a day (QID) | ORAL | 0 refills | Status: DC | PRN

## 2023-04-04 MED ORDER — ONDANSETRON HCL 4 MG PO TABS: 4.0000 mg | ORAL_TABLET | Freq: Four times a day (QID) | ORAL | 0 refills | Status: DC | PRN

## 2023-04-04 MED ORDER — ASPIRIN 325 MG PO TBEC: 325.0000 mg | DELAYED_RELEASE_TABLET | Freq: Every day | ORAL | 0 refills | Status: AC

## 2023-04-04 MED ORDER — METHOCARBAMOL 500 MG PO TABS: 500.0000 mg | ORAL_TABLET | Freq: Four times a day (QID) | ORAL | 0 refills | Status: DC | PRN

## 2023-04-04 NOTE — Progress Notes (Signed)
   Subjective: 1 Day Post-Op Procedure(s) (LRB): TOTAL HIP ARTHROPLASTY ANTERIOR APPROACH (Right) Patient reports pain as mild.   Patient seen in rounds by Dr. Lequita Halt. Patient is well, and has had no acute complaints or problems. Denies chest pain or SOB. No issues overnight. Voiding without difficulty We will begin therapy today.  Objective: Vital signs in last 24 hours: Temp:  [97.6 F (36.4 C)-98.5 F (36.9 C)] 97.7 F (36.5 C) (06/25 0511) Pulse Rate:  [54-67] 64 (06/25 0511) Resp:  [12-18] 18 (06/25 0511) BP: (105-166)/(60-94) 155/94 (06/25 0511) SpO2:  [93 %-100 %] 97 % (06/25 0511) Weight:  [101 kg] 101 kg (06/24 1209)  Intake/Output from previous day:  Intake/Output Summary (Last 24 hours) at 04/04/2023 0809 Last data filed at 04/04/2023 0759 Gross per 24 hour  Intake 3482.5 ml  Output 2125 ml  Net 1357.5 ml     Intake/Output this shift: Total I/O In: -  Out: 200 [Urine:200]  Labs: Recent Labs    04/04/23 0323  HGB 14.1   Recent Labs    04/04/23 0323  WBC 10.3  RBC 4.50  HCT 42.8  PLT 204   Recent Labs    04/04/23 0323  NA 137  K 4.2  CL 103  CO2 26  BUN 28*  CREATININE 0.99  GLUCOSE 131*  CALCIUM 8.6*   No results for input(s): "LABPT", "INR" in the last 72 hours.  Exam: General - Patient is Alert and Oriented Extremity - Neurologically intact Neurovascular intact Sensation intact distally Dorsiflexion/Plantar flexion intact Dressing - dressing C/D/I Motor Function - intact, moving foot and toes well on exam.   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   Weight as of this encounter: 101 kg. Advance diet Up with therapy D/C IV fluids  DVT Prophylaxis - Aspirin and Effient Weight bearing as tolerated. Begin therapy.  Plan is to go Home after hospital stay. Plan for discharge with HEP later today if progresses with therapy and meeting goals. Follow-up in the office in 2 weeks.  The PDMP database was reviewed today prior to any opioid medications being prescribed to this patient.  Arther Abbott, PA-C Orthopedic Surgery [phone] 04/04/2023, 8:09 AM

## 2023-04-04 NOTE — TOC Transition Note (Signed)
Transition of Care [facility]) - CM/SW Discharge Note   Patient Details  Name: Leon Mitchell MRN: [id number] Date of Birth: Nov 28, 1955  [facility] CM/SW Contact:  Amada Jupiter, LCSW Phone Number: 04/04/2023, 11:44 AM   Clinical Narrative:     Met with pt who confirms he has needed DME in the home.  Plan HEP.  No TOC needs.  Final next level of care: Home/Self Care Barriers to Discharge: No Barriers Identified   Patient Goals and CMS Choice      Discharge Placement                         Discharge Plan and Services Additional resources added to the After Visit Summary for                  DME Arranged: N/A                    Social Determinants of Health (SDOH) Interventions SDOH Screenings   Food Insecurity: No Food Insecurity (04/03/2023)  Housing: Low Risk  (04/03/2023)  Transportation Needs: No Transportation Needs (04/03/2023)  Utilities: Not At Risk (04/03/2023)  Depression (PHQ2-9): Medium Risk (11/15/2022)  Tobacco Use: High Risk (04/04/2023)     Readmission Risk Interventions     No data to display

## 2023-04-04 NOTE — Evaluation (Signed)
Physical Therapy Evaluation Patient Details Name: Leon Mitchell MRN: [id number] DOB: [birth year] Today's Date: 04/04/2023  History of Present Illness  Pt is 67 yo male s/p R anterior THA on 04/03/23.  He has hx including but not limited to CAD, COPD, HTN, OA, lumbar and thoracic surgery x 4 with last one 9/23.  Clinical Impression  Pt is s/p THA resulting in the deficits listed below (see PT Problem List). At baseline pt is independent.  He lives alone but has arranged for assistance for the first week.  He lives in multi level home and prefers to go upstairs but able to stay downstairs if needed.  Pt has DME from prior surgeries.  Today, pt with good pain control and tolerated therapy well.  He was able to ambulate 100' with RW and performed stairs similar to home set up.  Pt demonstrates safe gait & transfers in order to return home from PT perspective once discharged by MD.  While in hospital, will continue to benefit from PT for skilled therapy to advance mobility and exercises.        Recommendations for follow up therapy are one component of a multi-disciplinary discharge planning process, led by the attending physician.  Recommendations may be updated based on patient status, additional functional criteria and insurance authorization.  Follow Up Recommendations       Assistance Recommended at Discharge Intermittent Supervision/Assistance  Patient can return home with the following  A little help with walking and/or transfers;A little help with bathing/dressing/bathroom;Assistance with cooking/housework;Help with stairs or ramp for entrance    Equipment Recommendations None recommended by PT  Recommendations for Other Services       Functional Status Assessment Patient has had a recent decline in their functional status and demonstrates the ability to make significant improvements in function in a reasonable and predictable amount of time.     Precautions / Restrictions  Precautions Precautions: Fall Restrictions Weight Bearing Restrictions: Yes RLE Weight Bearing: Weight bearing as tolerated      Mobility  Bed Mobility Overal bed mobility: Needs Assistance Bed Mobility: Supine to Sit     Supine to sit: Supervision, HOB elevated     General bed mobility comments: self assist R LE with gait belt or hands    Transfers Overall transfer level: Needs assistance Equipment used: Rolling walker (2 wheels) Transfers: Sit to/from Stand Sit to Stand: Supervision, Min guard           General transfer comment: Performed x 4 during session progressing from min guard to supervision; cues for hand placement    Ambulation/Gait Ambulation/Gait assistance: Min guard, Supervision Gait Distance (Feet): 100 Feet Assistive device: Rolling walker (2 wheels) Gait Pattern/deviations: Step-through pattern, Trunk flexed       General Gait Details: min cues for posture, good stability and functional speed, progressed from min guard to supervision  Stairs Stairs: Yes Stairs assistance: Min guard Stair Management: Step to pattern, Forwards Number of Stairs: 11 General stair comments: Performed stairs in multiple manners to simulate stairs at home.  Initially started with bil rails to simulate in home stairs and then did one rail L and wall on right to simulate entry stairs.  Pt prefers to go up with good first but also down with good first.  Tolerated well  Wheelchair Mobility    Modified Rankin (Stroke Patients Only)       Balance Overall balance assessment: Needs assistance Sitting-balance support: No upper extremity supported Sitting balance-Leahy Scale: Normal  Standing balance support: Bilateral upper extremity supported, No upper extremity supported Standing balance-Leahy Scale: Fair Standing balance comment: RW to ambulate but could static stand and weight shift without UE support                             Pertinent  Vitals/Pain Pain Assessment Pain Assessment: 0-10 Pain Score: 3  Pain Location: R hip Pain Descriptors / Indicators: Discomfort Pain Intervention(s): Limited activity within patient's tolerance, Monitored during session, Repositioned, Ice applied, Premedicated before session    Home Living Family/patient expects to be discharged to:: Private residence Living Arrangements: Alone Available Help at Discharge: Family;Friend(s);Available 24 hours/day (family/friends arranged for a week) Type of Home: House Home Access: Stairs to enter Entrance Stairs-Rails: Left;Can reach both Entrance Stairs-Number of Steps: 6 Alternate Level Stairs-Number of Steps: flight Home Layout: Two level;Able to live on main level with bedroom/bathroom (his room upstairs but could stay downstairs if needed) Home Equipment: Rolling Walker (2 wheels);Rollator (4 wheels);Cane - single point;Shower seat;Adaptive equipment;BSC/3in1      Prior Function Prior Level of Function : Independent/Modified Independent;Driving             Mobility Comments: Could ambulate in community; Has been using rollator since January due to hip pain ADLs Comments: Can do IADLS and ADLs     Hand Dominance        Extremity/Trunk Assessment   Upper Extremity Assessment Upper Extremity Assessment: Overall WFL for tasks assessed    Lower Extremity Assessment Lower Extremity Assessment: LLE deficits/detail;RLE deficits/detail RLE Deficits / Details: 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 States he has mild foot drop and weak hip flexion at baseline.  ROM WFL; MMT: ankle 4/5, knee 3/5, hip 1/5 RLE Sensation: history of peripheral neuropathy LLE Deficits / Details: ROM WFL; MMT 5/5; does report "bad knee" that needs TKA LLE Sensation: history of peripheral neuropathy    Cervical / Trunk Assessment Cervical / Trunk Assessment: Kyphotic;Other exceptions Cervical / Trunk Exceptions: hx multiple back  surgeries  Communication   Communication: No difficulties  Cognition Arousal/Alertness: Awake/alert Behavior During Therapy: WFL for tasks assessed/performed Overall Cognitive Status: Within Functional Limits for tasks assessed                                          General Comments  Educated on safe ice use, no pivots, car transfers,. Also, encouraged walking every 1-2 hours during day. Educated on HEP with focus on mobility the first weeks. Discussed doing exercises within pain control and if pain increasing could decreased ROM, reps, and stop exercises as needed. Encouraged toankle pumps frequently for blood flow.   Exercises Total Joint Exercises Ankle Circles/Pumps: AROM, Both, 10 reps, Supine Quad Sets: Supine, AROM, Both, 10 reps Heel Slides: AAROM, Right, 10 reps, Supine Hip ABduction/ADduction: AAROM, Right, 10 reps, Supine Long Arc Quad: AROM, Seated, Right, 5 reps Other Exercises Other Exercises: Standing R leg x 5 , supervision, holding on counter: hip abd, hip flex, hip ext, and H-S curl Other Exercises: on all educated on AAROM techniques as needed, only doing to tolerance, and decreasing if too painful in the first week   Assessment/Plan    PT Assessment Patient needs continued PT services  PT Problem List Decreased strength;Pain;Decreased range of motion;Decreased activity tolerance;Decreased balance;Decreased mobility;Decreased knowledge of use of  DME       PT Treatment Interventions DME instruction;Therapeutic exercise;Gait training;Balance training;Stair training;Functional mobility training;Therapeutic activities;Patient/family education;Modalities    PT Goals (Current goals can be found in the Care Plan section)  Acute Rehab PT Goals Patient Stated Goal: return home PT Goal Formulation: With patient/family Time For Goal Achievement: 04/18/23 Potential to Achieve Goals: Good    Frequency 7X/week     Co-evaluation                AM-PAC PT "6 Clicks" Mobility  Outcome Measure Help needed turning from your back to your side while in a flat bed without using bedrails?: A Little Help needed moving from lying on your back to sitting on the side of a flat bed without using bedrails?: A Little Help needed moving to and from a bed to a chair (including a wheelchair)?: A Little Help needed standing up from a chair using your arms (e.g., wheelchair or bedside chair)?: A Little Help needed to walk in hospital room?: A Little Help needed climbing 3-5 steps with a railing? : A Little 6 Click Score: 18    End of Session Equipment Utilized During Treatment: Gait belt Activity Tolerance: Patient tolerated treatment well Patient left: with call bell/phone within reach;with chair alarm set;in chair Nurse Communication: Mobility status PT Visit Diagnosis: Other abnormalities of gait and mobility (R26.89);Muscle weakness (generalized) (M62.81)    Time: 6440-3474 PT Time Calculation (min) (ACUTE ONLY): 38 min   Charges:   PT Evaluation $PT Eval Low Complexity: 1 Low PT Treatments $Gait Training: 8-22 mins $Therapeutic Exercise: 8-22 mins        Anise Salvo, PT Acute Rehab [facility] [phone]   Rayetta Humphrey 04/04/2023, 12:09 PM

## 2023-04-04 NOTE — Care Management Obs Status (Signed)
MEDICARE OBSERVATION STATUS NOTIFICATION   Patient Details  Name: TAVARIUS GREWE MRN: [id number] Date of Birth: 07-07-1956   Medicare Observation Status Notification Given:  Hart Robinsons, LCSW 04/04/2023, 10:47 AM

## 2023-04-06 ENCOUNTER — Other Ambulatory Visit: Payer: Self-pay | Admitting: Cardiology

## 2023-04-06 DIAGNOSIS — M1611 Unilateral primary osteoarthritis, right hip: Secondary | ICD-10-CM

## 2023-04-07 DIAGNOSIS — Z96641 Presence of right artificial hip joint: Secondary | ICD-10-CM | POA: Diagnosis not present

## 2023-04-07 DIAGNOSIS — R5383 Other fatigue: Secondary | ICD-10-CM | POA: Diagnosis not present

## 2023-04-07 DIAGNOSIS — Z09 Encounter for follow-up examination after completed treatment for conditions other than malignant neoplasm: Secondary | ICD-10-CM | POA: Diagnosis not present

## 2023-04-10 NOTE — Discharge Summary (Signed)
Patient ID: Leon Mitchell MRN: [id number] DOB/AGE: [birth year] 67 y.o.  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 male who was admitted 04/03/2023 for operative treatment ofOA (osteoarthritis) of hip. Patient has severe unremitting pain that affects sleep, daily activities, and work/hobbies. After pre-op clearance the patient was taken to the operating room on 04/03/2023 and underwent  Procedure(s): TOTAL HIP ARTHROPLASTY ANTERIOR APPROACH.    Patient was given perioperative antibiotics:  Anti-infectives (From admission, onward)    Start     Dose/Rate Route Frequency Ordered Stop   04/03/23 1930  ceFAZolin (ANCEF) IVPB 2g/100 mL premix        2 g 200 mL/hr over 30 Minutes Intravenous Every 6 hours 04/03/23 1815 04/04/23 0228   04/03/23 1145  ceFAZolin (ANCEF) IVPB 2g/100 mL premix        2 g 200 mL/hr over 30 Minutes Intravenous On call to O.R. 04/03/23 1132 04/03/23 1336        Patient was given sequential compression devices, early ambulation, and chemoprophylaxis to prevent DVT.  Patient benefited maximally from hospital stay and there were no complications.    Recent vital signs: No data found.   Recent laboratory studies: No results for input(s): "WBC", "HGB", "HCT", "PLT", "NA",  "K", "CL", "CO2", "BUN", "CREATININE", "GLUCOSE", "INR", "CALCIUM" in the last 72 hours.  Invalid input(s): "PT", "2"   Discharge Medications:   Allergies as of 04/04/2023       Reactions   Lipitor [atorvastatin] Other (See Comments)   Severe myalgia   Rosuvastatin Other (See Comments)   Severe myalgia        Medication List     STOP taking these medications    cyclobenzaprine 10 MG tablet Commonly known as: FLEXERIL   diclofenac 75 MG EC tablet Commonly known as: VOLTAREN   HYDROcodone-acetaminophen 5-325 MG tablet Commonly known as: NORCO/VICODIN       TAKE these medications    aspirin EC 325 MG tablet Take 1 tablet (325 mg total) by mouth daily for 20 days. Then resume one 81 mg aspirin daily. What changed:  medication strength how much to take when to take this additional instructions   atorvastatin 20 MG tablet Commonly known as: LIPITOR Take 0.5 tablets (10 mg total) by mouth every other day.   gabapentin 300 MG capsule Commonly known as: NEURONTIN Take 600 mg by mouth 2 (two) times daily.   lisinopril 20 MG tablet Commonly known as: ZESTRIL TAKE 1 TABLET BY MOUTH DAILY   methocarbamol 500 MG tablet Commonly known as: ROBAXIN Take 1 tablet (500 mg total) by mouth every 6 (six) hours as needed for muscle spasms.   metoprolol succinate 25 MG 24 hr tablet Commonly known as: TOPROL-XL Take  1 tablet (25 mg total) by mouth daily.   Nexlizet 180-10 MG Tabs Generic drug: Bempedoic Acid-Ezetimibe Take 1 tablet by mouth daily.   nitroGLYCERIN 0.4 MG SL tablet Commonly known as: NITROSTAT Place 1 tablet (0.4 mg total) under the tongue every 5 (five) minutes as needed for up to 25 days for chest pain.   ondansetron 4 MG tablet Commonly known as: ZOFRAN Take 1 tablet (4 mg total) by mouth every 6 (six) hours as needed for nausea.   oxyCODONE 5 MG immediate release tablet Commonly known as: Oxy IR/ROXICODONE Take 1-2 tablets (5-10 mg total) by  mouth every 6 (six) hours as needed for severe pain.   prasugrel 10 MG Tabs tablet Commonly known as: EFFIENT Take 1 tablet (10 mg total) by mouth daily.   traMADol 50 MG tablet Commonly known as: ULTRAM TAKE 1 TABLET BY MOUTH TWICE DAILY AS NEEDED   Vitamin D (Ergocalciferol) 1.25 MG (50000 UNIT) Caps capsule Commonly known as: DRISDOL Take 50,000 Units by mouth every 7 (seven) days.               Discharge Care Instructions  (From admission, onward)           Start     Ordered   04/04/23 0000  Weight bearing as tolerated        04/04/23 0811   04/04/23 0000  Change dressing       Comments: You have an adhesive waterproof bandage over the incision. Leave this in place until your first follow-up appointment. Once you remove this you will not need to place another bandage.   04/04/23 0811            Diagnostic Studies: DG Pelvis Portable  Result Date: 04/03/2023 CLINICAL DATA:  Status post right hip arthroplasty EXAM: PORTABLE PELVIS 1-2 VIEWS COMPARISON:  None Available. FINDINGS: There is recent right hip arthroplasty. There are pockets of air in the soft tissues. There is no demonstrable hardware failure. No fracture lines are seen. IMPRESSION: Status post right hip arthroplasty. Electronically Signed   By: Ernie Avena M.D.   On: 04/03/2023 14:56   DG HIP UNILAT WITH PELVIS 1V RIGHT  Result Date: 04/03/2023 CLINICAL DATA:  Right hip surgery EXAM: DG HIP (WITH OR WITHOUT PELVIS) 1V RIGHT COMPARISON:  10/27/2021 FINDINGS: Nine C-arm fluoroscopic images were obtained intraoperatively and submitted for post operative interpretation. Images demonstrate placement of right total hip arthroplasty hardware. 6 seconds of fluoroscopy time utilized. Radiation dose: 1.35 mGy. Please see the performing provider's procedural report for further detail. IMPRESSION: Intraoperative fluoroscopic guidance for right total hip arthroplasty. Electronically Signed   By: Duanne Guess D.O.   On: 04/03/2023 14:42   DG C-Arm 1-60 Min-No Report  Result Date: 04/03/2023 Fluoroscopy was utilized by the requesting physician.  No radiographic interpretation.    Disposition: Discharge disposition: 01-Home or Self Care       Discharge Instructions     Call MD / Call 911   Complete by: As directed    If you experience chest pain or shortness of breath, CALL 911 and be transported to the hospital emergency room.  If you develope a fever above 101 F, pus (white drainage) or increased drainage or redness at the wound, or calf pain, call your surgeon's office.   Change dressing   Complete by: As directed    You have an adhesive waterproof bandage over the incision. Leave this in place until your first follow-up appointment. Once you  remove this you will not need to place another bandage.   Constipation Prevention   Complete by: As directed    Drink plenty of fluids.  Prune juice may be helpful.  You may use a stool softener, such as Colace (over the counter) 100 mg twice a day.  Use MiraLax (over the counter) for constipation as needed.   Diet - low sodium heart healthy   Complete by: As directed    Do not sit on low chairs, stoools or toilet seats, as it may be difficult to get up from low surfaces   Complete by: As directed    Driving restrictions   Complete by: As directed    No driving for two weeks   Post-operative opioid taper instructions:   Complete by: As directed    POST-OPERATIVE OPIOID TAPER INSTRUCTIONS: It is important to wean off of your opioid medication as soon as possible. If you do not need pain medication after your surgery it is ok to stop day one. Opioids include: Codeine, Hydrocodone(Norco, Vicodin), Oxycodone(Percocet, oxycontin) and hydromorphone amongst others.  Long term and even short term use of opiods can cause: Increased pain response Dependence Constipation Depression Respiratory depression And more.  Withdrawal symptoms can  include Flu like symptoms Nausea, vomiting And more Techniques to manage these symptoms Hydrate well Eat regular healthy meals Stay active Use relaxation techniques(deep breathing, meditating, yoga) Do Not substitute Alcohol to help with tapering If you have been on opioids for less than two weeks and do not have pain than it is ok to stop all together.  Plan to wean off of opioids This plan should start within one week post op of your joint replacement. Maintain the same interval or time between taking each dose and first decrease the dose.  Cut the total daily intake of opioids by one tablet each day Next start to increase the time between doses. The last dose that should be eliminated is the evening dose.      TED hose   Complete by: As directed    Use stockings (TED hose) for three weeks on both leg(s).  You may remove them at night for sleeping.   Weight bearing as tolerated   Complete by: As directed         Follow-up Information     Ollen Gross, MD. Go on 04/18/2023.   Specialty: Orthopedic Surgery Why: You are scheduled for first post op appt on Tuesday July 9 at 1:15pm. Contact information: [address] [place] [address] [phone]                  Signed: Arther Abbott 04/10/2023, 7:26 AM

## 2023-04-15 ENCOUNTER — Other Ambulatory Visit: Payer: Self-pay | Admitting: Cardiology

## 2023-04-15 DIAGNOSIS — M1611 Unilateral primary osteoarthritis, right hip: Secondary | ICD-10-CM

## 2023-04-20 ENCOUNTER — Other Ambulatory Visit: Payer: Self-pay | Admitting: Cardiology

## 2023-04-20 DIAGNOSIS — M1611 Unilateral primary osteoarthritis, right hip: Secondary | ICD-10-CM

## 2023-04-26 ENCOUNTER — Encounter: Payer: Self-pay | Admitting: Cardiology

## 2023-05-02 DIAGNOSIS — L570 Actinic keratosis: Secondary | ICD-10-CM | POA: Diagnosis not present

## 2023-05-02 DIAGNOSIS — Z1283 Encounter for screening for malignant neoplasm of skin: Secondary | ICD-10-CM | POA: Diagnosis not present

## 2023-05-02 DIAGNOSIS — D485 Neoplasm of uncertain behavior of skin: Secondary | ICD-10-CM | POA: Diagnosis not present

## 2023-05-02 DIAGNOSIS — Z85828 Personal history of other malignant neoplasm of skin: Secondary | ICD-10-CM | POA: Diagnosis not present

## 2023-05-09 DIAGNOSIS — Z5189 Encounter for other specified aftercare: Secondary | ICD-10-CM | POA: Diagnosis not present

## 2023-05-12 DIAGNOSIS — Z1339 Encounter for screening examination for other mental health and behavioral disorders: Secondary | ICD-10-CM | POA: Diagnosis not present

## 2023-05-12 DIAGNOSIS — I10 Essential (primary) hypertension: Secondary | ICD-10-CM | POA: Diagnosis not present

## 2023-05-12 DIAGNOSIS — Z1331 Encounter for screening for depression: Secondary | ICD-10-CM | POA: Diagnosis not present

## 2023-05-12 DIAGNOSIS — Z0000 Encounter for general adult medical examination without abnormal findings: Secondary | ICD-10-CM | POA: Diagnosis not present

## 2023-05-12 DIAGNOSIS — Z125 Encounter for screening for malignant neoplasm of prostate: Secondary | ICD-10-CM | POA: Diagnosis not present

## 2023-05-12 DIAGNOSIS — Z299 Encounter for prophylactic measures, unspecified: Secondary | ICD-10-CM | POA: Diagnosis not present

## 2023-05-12 DIAGNOSIS — Z79899 Other long term (current) drug therapy: Secondary | ICD-10-CM | POA: Diagnosis not present

## 2023-05-12 DIAGNOSIS — E7800 Pure hypercholesterolemia, unspecified: Secondary | ICD-10-CM | POA: Diagnosis not present

## 2023-05-12 DIAGNOSIS — Z7189 Other specified counseling: Secondary | ICD-10-CM | POA: Diagnosis not present

## 2023-05-12 DIAGNOSIS — R5383 Other fatigue: Secondary | ICD-10-CM | POA: Diagnosis not present

## 2023-05-31 ENCOUNTER — Encounter: Payer: Self-pay | Admitting: Cardiology

## 2023-06-05 ENCOUNTER — Other Ambulatory Visit: Payer: Self-pay | Admitting: Cardiology

## 2023-06-13 DIAGNOSIS — E7800 Pure hypercholesterolemia, unspecified: Secondary | ICD-10-CM | POA: Diagnosis not present

## 2023-06-14 LAB — LIPID PANEL WITH LDL/HDL RATIO
Cholesterol, Total: 150 mg/dL (ref 100–199)
HDL: 39 mg/dL — ABNORMAL LOW (ref >39)
LDL Chol Calc (NIH): 93 mg/dL (ref 0–99)
LDL/HDL Ratio: 2.4 ratio (ref 0.0–3.6)
Triglycerides: 93 mg/dL (ref 0–149)
VLDL Cholesterol Cal: 18 mg/dL (ref 5–40)

## 2023-06-29 ENCOUNTER — Ambulatory Visit: Payer: Medicare Other | Admitting: Cardiology

## 2023-07-03 DIAGNOSIS — H26493 Other secondary cataract, bilateral: Secondary | ICD-10-CM | POA: Diagnosis not present

## 2023-07-04 ENCOUNTER — Ambulatory Visit: Payer: Medicare Other | Admitting: Cardiology

## 2023-07-04 NOTE — Progress Notes (Unsigned)
Cardiology Office Note:    Date:  07/05/2023  ID:  Leon Mitchell, DOB 08-Aug-1956, MRN [id number] PCP: Ignatius Specking, MD  [facility] Providers Cardiologist:  Yates Decamp, MD       Patient Profile:      Coronary artery disease Inferolateral STEMI in 10/2022 s/p 4 x 16 mm DES to the proximal RCA LHC 10/18/2022: EF 45, inferior HK; LAD mid mild disease; LCx mid 30; RCA proximal 95-99 TTE 10/19/2022: EF 60-65, no RWMA, normal RVSF, mild AI, RAP 3 Hypertension Hyperlipidemia Intolerant of statins Carotid artery disease [place] 02/22/2023: Bilateral ICA 1-39 Chronic Obstructive Pulmonary Disease  Tobacco use         History of Present Illness:  Discussed the use of AI scribe software for clinical note transcription with the patient, who gave verbal consent to proceed.    Leon Mitchell is a 67 y.o. male who returns for follow-up of CAD.  He was last seen in June 2024. Today, he is here alone.  The patient has a history of multiple surgeries, including four back surgeries, a hip operation, and a knee operation. He has neuropathy, for which he takes gabapentin and diclofenac.  He has not had chest pain, significant shortness of breath, orthopnea, leg edema or syncope.     ROS: See HPI.  +Bruising No melena, hematochezia, hematuria.     Studies Reviewed:       Risk Assessment/Calculations:             Physical Exam:   VS:  BP 108/60   Pulse (!) 53   Ht 6' (1.829 m)   Wt 234 lb (106.1 kg)   SpO2 96%   BMI 31.74 kg/m    Wt Readings from Last 3 Encounters:  07/05/23 234 lb (106.1 kg)  04/03/23 222 lb 10.6 oz (101 kg)  03/21/23 219 lb (99.3 kg)    Constitutional:      Appearance: Healthy appearance. Not in distress.  Neck:     Vascular: No JVR. JVD normal.  Pulmonary:     Breath sounds: Normal breath sounds. No wheezing. No rales.  Cardiovascular:     Normal rate. Regular rhythm.     Murmurs: There is no murmur.  Edema:    Peripheral edema absent.  Abdominal:      Palpations: Abdomen is soft.      Assessment and Plan:     Coronary Artery Disease (CAD) History of inferolateral STEMI in January 2024 treated with DES to proximal RCA. EF normalized to 60-65% from 45% post-MI. LDL remains elevated at 93 despite Atorvastatin 10mg  every other day and Nexlizet 180/10mg  daily. He is doing well w/o chest pain to suggest angina. We discussed the need for DAPT x 1 year post MI/PCI.  -Continue atorvastatin 10 mg every other day, bempedoic acid/ezetimibe 180/10 mg daily, as needed nitroglycerin, aspirin 81 mg daily, Effient 10 mg daily -Follow-up 4 months  Hypertension Well controlled. -Continue Lisinopril 20 mg once daily, Metoprolol succinate 25 mg once daily.  Hyperlipidemia LDL remains greater than 55 -Continue Atorvastatin 10mg  every other day and Nexlizet 180/10mg  daily. -Refer to [facility] Lipid clinic for potential initiation of PCSK9 inhibitor (Repatha or Praluent).  Smoking Continues to smoke despite known CAD. -Encourage smoking cessation for cardiovascular risk reduction.            Dispo:  Return in about 4 months (around 11/04/2023) for Routine Follow Up w/ Dr. Jacinto Halim.  Signed, Tereso Newcomer, PA-C

## 2023-07-05 ENCOUNTER — Encounter: Payer: Self-pay | Admitting: Physician Assistant

## 2023-07-05 ENCOUNTER — Ambulatory Visit: Payer: Medicare Other | Attending: Cardiology | Admitting: Physician Assistant

## 2023-07-05 VITALS — BP 108/60 | HR 53 | Ht 72.0 in | Wt 234.0 lb

## 2023-07-05 DIAGNOSIS — F17210 Nicotine dependence, cigarettes, uncomplicated: Secondary | ICD-10-CM | POA: Diagnosis not present

## 2023-07-05 DIAGNOSIS — I2510 Atherosclerotic heart disease of native coronary artery without angina pectoris: Secondary | ICD-10-CM | POA: Insufficient documentation

## 2023-07-05 DIAGNOSIS — E7800 Pure hypercholesterolemia, unspecified: Secondary | ICD-10-CM | POA: Insufficient documentation

## 2023-07-05 DIAGNOSIS — I10 Essential (primary) hypertension: Secondary | ICD-10-CM | POA: Insufficient documentation

## 2023-07-05 NOTE — Patient Instructions (Signed)
Medication Instructions:  Your physician recommends that you continue on your current medications as directed. Please refer to the Current Medication list given to you today.   *If you need a refill on your cardiac medications before your next appointment, please call your pharmacy*   Lab Work: None ordered  If you have labs (blood work) drawn today and your tests are completely normal, you will receive your results only by: MyChart Message (if you have MyChart) OR A paper copy in the mail If you have any lab test that is abnormal or we need to change your treatment, we will call you to review the results.   Testing/Procedures: None ordered    You have been referred to Our pharmacist to discuss PCSK9 inhibitor for Cholesterol treatment.   Follow-Up: At [facility], you and your health needs are our priority.  As part of our continuing mission to provide you with exceptional heart care, we have created designated Provider Care Teams.  These Care Teams include your primary Cardiologist (physician) and Advanced Practice Providers (APPs -  Physician Assistants and Nurse Practitioners) who all work together to provide you with the care you need, when you need it.  We recommend signing up for the patient portal called "MyChart".  Sign up information is provided on this After Visit Summary.  MyChart is used to connect with patients for Virtual Visits (Telemedicine).  Patients are able to view lab/test results, encounter notes, upcoming appointments, etc.  Non-urgent messages can be sent to your provider as well.   To learn more about what you can do with MyChart, go to [email or web address].    Your next appointment:   4 month(s)  Provider:   Yates Decamp, MD     Other Instructions

## 2023-07-13 ENCOUNTER — Other Ambulatory Visit: Payer: Self-pay | Admitting: Cardiology

## 2023-07-13 DIAGNOSIS — M1611 Unilateral primary osteoarthritis, right hip: Secondary | ICD-10-CM

## 2023-07-14 NOTE — Telephone Encounter (Signed)
Pt's pharmacy is requesting  a refill on tramadol. This is a non cardiac medication. Would Dr. Jacinto Halim like to refill this medication? Please address

## 2023-07-26 NOTE — Progress Notes (Addendum)
Anesthesia Review:  PCP: DR Doreen Beam- LOV 05/12/23  Cardiologist : DR Jacinto Halim LOV 03/21/23  Scott Weaver LVO 07/05/23  Chest x-ray :CT Chest- 02/28/23  EKG : 10/17/22  Echo : 10/19/2022  Stress test: Cardiac Cath :  10/19/22  Activity level:  Sleep Study/ CPAP : Fasting Blood Sugar :      / Checks Blood Sugar -- times a day:   Blood Thinner/ Instructions /Last Dose: ASA / Instructions/ Last Dose :    81 mg aspirin -    Effient (Prasugrel)- pt at time of preop has not yet been given preop instructions .  Instructed pt along with written on preop instructions to call office of DR Aluisio and office of DR Jacinto Halim on 08/01/23 for preop instructions.  PT voiced understanding.     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  PT reports at preop " that is why I walk with a limp".

## 2023-07-26 NOTE — Patient Instructions (Addendum)
SURGICAL WAITING ROOM VISITATION  Patients having surgery or a procedure may have no more than 2 support people in the waiting area - these visitors may rotate.    Children under the age of 79 must have an adult with them who is not the patient.  Due to an increase in RSV and influenza rates and associated hospitalizations, children ages 81 and under may not visit patients in [facility] hospitals.  If the patient needs to stay at the hospital during part of their recovery, the visitor guidelines for inpatient rooms apply. Pre-op nurse will coordinate an appropriate time for 1 support person to accompany patient in pre-op.  This support person may not rotate.    Please refer to the [facility] website for the visitor guidelines for Inpatients (after your surgery is over and you are in a regular room).       Your procedure is scheduled on:  08/07/23    Report to [facility] Main Entrance    Report to admitting at   0800AM   Call this number if you have problems the morning of surgery [phone]   Do not eat food :After Midnight.   After Midnight you may have the following liquids until _ 0730_____ AM DAY OF SURGERY  Water Non-Citrus Juices (without pulp, NO RED-Apple, White grape, White cranberry) Black Coffee (NO MILK/CREAM OR CREAMERS, sugar ok)  Clear Tea (NO MILK/CREAM OR CREAMERS, sugar ok) regular and decaf                             Plain Jell-O (NO RED)                                           Fruit ices (not with fruit pulp, NO RED)                                     Popsicles (NO RED)                                                               Sports drinks like Gatorade (NO RED)                    The day of surgery:  Drink ONE (1) Pre-Surgery Clear Ensure or G2 at     0730AM ( have completed by )  the morning of surgery. Drink in one sitting. Do not sip.  This drink was given to you during your hospital  pre-op appointment visit. Nothing else to  drink after completing the  Pre-Surgery Clear Ensure or G2.          If you have questions, please contact your surgeon's office.        Oral Hygiene is also important to reduce your risk of infection.                                    Remember - BRUSH YOUR TEETH THE MORNING  OF SURGERY WITH YOUR REGULAR TOOTHPASTE  DENTURES WILL BE REMOVED PRIOR TO SURGERY PLEASE DO NOT APPLY "Poly grip" OR ADHESIVES!!!   Do NOT smoke after Midnight   Stop all vitamins and herbal supplements 7 days before surgery.   Take these medicines the morning of surgery with A SIP OF WATER: gabapentin, flomax   DO NOT TAKE ANY ORAL DIABETIC MEDICATIONS DAY OF YOUR SURGERY  Bring CPAP mask and tubing day of surgery.                              You may not have any metal on your body including hair pins, jewelry, and body piercing             Do not wear make-up, lotions, powders, perfumes/cologne, or deodorant  Do not wear nail polish including gel and S&S, artificial/acrylic nails, or any other type of covering on natural nails including finger and toenails. If you have artificial nails, gel coating, etc. that needs to be removed by a nail salon please have this removed prior to surgery or surgery may need to be canceled/ delayed if the surgeon/ anesthesia feels like they are unable to be safely monitored.   Do not shave  48 hours prior to surgery.               Men may shave face and neck.   Do not bring valuables to the hospital.  IS NOT             RESPONSIBLE   FOR VALUABLES.   Contacts, glasses, dentures or bridgework may not be worn into surgery.   Bring small overnight bag day of surgery.   DO NOT BRING YOUR HOME MEDICATIONS TO THE HOSPITAL. PHARMACY WILL DISPENSE MEDICATIONS LISTED ON YOUR MEDICATION LIST TO YOU DURING YOUR ADMISSION IN THE HOSPITAL!    Patients discharged on the day of surgery will not be allowed to drive home.  Someone NEEDS to stay with you for the first 24  hours after anesthesia.   Special Instructions: Bring a copy of your healthcare power of attorney and living will documents the day of surgery if you haven't scanned them before.              Please read over the following fact sheets you were given: IF YOU HAVE QUESTIONS ABOUT YOUR PRE-OP INSTRUCTIONS PLEASE CALL [phone]   If you received a COVID test during your pre-op visit  it is requested that you wear a mask when out in public, stay away from anyone that may not be feeling well and notify your surgeon if you develop symptoms. If you test positive for Covid or have been in contact with anyone that has tested positive in the last 10 days please notify you surgeon.      Pre-operative 5 CHG Bath Instructions   You can play a key role in reducing the risk of infection after surgery. Your skin needs to be as free of germs as possible. You can reduce the number of germs on your skin by washing with CHG (chlorhexidine gluconate) soap before surgery. CHG is an antiseptic soap that kills germs and continues to kill germs even after washing.   DO NOT use if you have an allergy to chlorhexidine/CHG or antibacterial soaps. If your skin becomes reddened or irritated, stop using the CHG and notify one of our RNs at [phone].   Please shower with the  CHG soap starting 4 days before surgery using the following schedule:     Please keep in mind the following:  DO NOT shave, including legs and underarms, starting the day of your first shower.   You may shave your face at any point before/day of surgery.  Place clean sheets on your bed the day you start using CHG soap. Use a clean washcloth (not used since being washed) for each shower. DO NOT sleep with pets once you start using the CHG.   CHG Shower Instructions:  If you choose to wash your hair and private area, wash first with your normal shampoo/soap.  After you use shampoo/soap, rinse your hair and body thoroughly to remove  shampoo/soap residue.  Turn the water OFF and apply about 3 tablespoons (45 ml) of CHG soap to a CLEAN washcloth.  Apply CHG soap ONLY FROM YOUR NECK DOWN TO YOUR TOES (washing for 3-5 minutes)  DO NOT use CHG soap on face, private areas, open wounds, or sores.  Pay special attention to the area where your surgery is being performed.  If you are having back surgery, having someone wash your back for you may be helpful. Wait 2 minutes after CHG soap is applied, then you may rinse off the CHG soap.  Pat dry with a clean towel  Put on clean clothes/pajamas   If you choose to wear lotion, please use ONLY the CHG-compatible lotions on the back of this paper.     Additional instructions for the day of surgery: DO NOT APPLY any lotions, deodorants, cologne, or perfumes.   Put on clean/comfortable clothes.  Brush your teeth.  Ask your nurse before applying any prescription medications to the skin.      CHG Compatible Lotions   Aveeno Moisturizing lotion  Cetaphil Moisturizing Cream  Cetaphil Moisturizing Lotion  Clairol Herbal Essence Moisturizing Lotion, Dry Skin  Clairol Herbal Essence Moisturizing Lotion, Extra Dry Skin  Clairol Herbal Essence Moisturizing Lotion, Normal Skin  Curel Age Defying Therapeutic Moisturizing Lotion with Alpha Hydroxy  Curel Extreme Care Body Lotion  Curel Soothing Hands Moisturizing Hand Lotion  Curel Therapeutic Moisturizing Cream, Fragrance-Free  Curel Therapeutic Moisturizing Lotion, Fragrance-Free  Curel Therapeutic Moisturizing Lotion, Original Formula  Eucerin Daily Replenishing Lotion  Eucerin Dry Skin Therapy Plus Alpha Hydroxy Crme  Eucerin Dry Skin Therapy Plus Alpha Hydroxy Lotion  Eucerin Original Crme  Eucerin Original Lotion  Eucerin Plus Crme Eucerin Plus Lotion  Eucerin TriLipid Replenishing Lotion  Keri Anti-Bacterial Hand Lotion  Keri Deep Conditioning Original Lotion Dry Skin Formula Softly Scented  Keri Deep Conditioning  Original Lotion, Fragrance Free Sensitive Skin Formula  Keri Lotion Fast Absorbing Fragrance Free Sensitive Skin Formula  Keri Lotion Fast Absorbing Softly Scented Dry Skin Formula  Keri Original Lotion  Keri Skin Renewal Lotion Keri Silky Smooth Lotion  Keri Silky Smooth Sensitive Skin Lotion  Nivea Body Creamy Conditioning Oil  Nivea Body Extra Enriched Teacher, adult education Moisturizing Lotion Nivea Crme  Nivea Skin Firming Lotion  NutraDerm 30 Skin Lotion  NutraDerm Skin Lotion  NutraDerm Therapeutic Skin Cream  NutraDerm Therapeutic Skin Lotion  ProShield Protective Hand Cream  Provon moisturizing lotion

## 2023-07-28 ENCOUNTER — Other Ambulatory Visit (HOSPITAL_COMMUNITY): Payer: Medicare Other

## 2023-08-01 ENCOUNTER — Encounter: Payer: Self-pay | Admitting: Student

## 2023-08-01 ENCOUNTER — Ambulatory Visit: Payer: Medicare Other | Attending: Internal Medicine | Admitting: Student

## 2023-08-01 ENCOUNTER — Telehealth: Payer: Self-pay | Admitting: Pharmacist

## 2023-08-01 ENCOUNTER — Other Ambulatory Visit (HOSPITAL_COMMUNITY): Payer: Self-pay

## 2023-08-01 ENCOUNTER — Telehealth: Payer: Self-pay | Admitting: Pharmacy Technician

## 2023-08-01 ENCOUNTER — Encounter (HOSPITAL_COMMUNITY)
Admission: RE | Admit: 2023-08-01 | Discharge: 2023-08-01 | Disposition: A | Discharge status: Home / Self Care | Payer: Medicare Other | Source: Ambulatory Visit | Attending: Orthopedic Surgery | Admitting: Orthopedic Surgery

## 2023-08-01 ENCOUNTER — Encounter (HOSPITAL_COMMUNITY): Payer: Self-pay

## 2023-08-01 ENCOUNTER — Other Ambulatory Visit: Payer: Self-pay

## 2023-08-01 VITALS — BP 128/88 | HR 52 | Temp 98.0°F | Resp 16 | Ht 72.0 in | Wt 225.0 lb

## 2023-08-01 DIAGNOSIS — M1712 Unilateral primary osteoarthritis, left knee: Secondary | ICD-10-CM | POA: Diagnosis not present

## 2023-08-01 DIAGNOSIS — J449 Chronic obstructive pulmonary disease, unspecified: Secondary | ICD-10-CM | POA: Diagnosis not present

## 2023-08-01 DIAGNOSIS — I2510 Atherosclerotic heart disease of native coronary artery without angina pectoris: Secondary | ICD-10-CM | POA: Diagnosis not present

## 2023-08-01 DIAGNOSIS — Z01812 Encounter for preprocedural laboratory examination: Secondary | ICD-10-CM | POA: Insufficient documentation

## 2023-08-01 DIAGNOSIS — F17210 Nicotine dependence, cigarettes, uncomplicated: Secondary | ICD-10-CM | POA: Insufficient documentation

## 2023-08-01 DIAGNOSIS — E7800 Pure hypercholesterolemia, unspecified: Secondary | ICD-10-CM | POA: Diagnosis not present

## 2023-08-01 DIAGNOSIS — I10 Essential (primary) hypertension: Secondary | ICD-10-CM | POA: Diagnosis not present

## 2023-08-01 DIAGNOSIS — Z01818 Encounter for other preprocedural examination: Secondary | ICD-10-CM

## 2023-08-01 LAB — CBC
HCT: 47.2 % (ref 39.0–52.0)
Hemoglobin: 16 g/dL (ref 13.0–17.0)
MCH: 32.9 pg (ref 26.0–34.0)
MCHC: 33.9 g/dL (ref 30.0–36.0)
MCV: 96.9 fL (ref 80.0–100.0)
Platelets: 250 10*3/uL (ref 150–400)
RBC: 4.87 MIL/uL (ref 4.22–5.81)
RDW: 14.1 % (ref 11.5–15.5)
WBC: 6.6 10*3/uL (ref 4.0–10.5)
nRBC: 0 % (ref 0.0–0.2)

## 2023-08-01 LAB — BASIC METABOLIC PANEL
Anion gap: 8 (ref 5–15)
BUN: 26 mg/dL — ABNORMAL HIGH (ref 8–23)
CO2: 25 mmol/L (ref 22–32)
Calcium: 9 mg/dL (ref 8.9–10.3)
Chloride: 106 mmol/L (ref 98–111)
Creatinine, Ser: 0.82 mg/dL (ref 0.61–1.24)
GFR, Estimated: >60 mL/min (ref >60)
Glucose, Bld: 94 mg/dL (ref 70–99)
Potassium: 4.4 mmol/L (ref 3.5–5.1)
Sodium: 139 mmol/L (ref 135–145)

## 2023-08-01 LAB — SURGICAL PCR SCREEN
MRSA, PCR: NEGATIVE
Staphylococcus aureus: NEGATIVE

## 2023-08-01 NOTE — Patient Instructions (Signed)
Your Results:             Your most recent labs Goal  Total Cholesterol 150 < 200  Triglycerides 93 < 150  HDL (happy/good cholesterol) 39  > 40  LDL (lousy/bad cholesterol 93 < 70   Medication changes: We will start the process to get PCSK9i(Repatha or Praluent) covered by your insurance.  Once the prior authorization is complete, we will call you to let you know and confirm pharmacy information.      Praluent is a cholesterol medication that improved your body's ability to get rid of "bad cholesterol" known as LDL. It can lower your LDL up to 60%. It is an injection that is given under the skin every 2 weeks. The most common side effects of Praluent include runny nose, symptoms of the common cold, rarely flu or flu-like symptoms, back/muscle pain in about 3-4% of the patients, and redness, pain, or bruising at the injection site.    Repatha is a cholesterol medication that improved your body's ability to get rid of "bad cholesterol" known as LDL. It can lower your LDL up to 60%! It is an injection that is given under the skin every 2 weeks. The medication often requires a prior authorization from your insurance company. We will take care of submitting all the necessary information to your insurance company to get it approved. The most common side effects of Repatha include runny nose, symptoms of the common cold, rarely flu or flu-like symptoms, back/muscle pain in about 3-4% of the patients, and redness, pain, or bruising at the injection site.   Lab orders: We want to repeat labs after 2-3 months.  We will send you a lab order to remind you once we get closer to that time.

## 2023-08-01 NOTE — Assessment & Plan Note (Signed)
Assessment:  LDL goal: < 70 mg/dl last LDLc 90 mg/dl (91/4782) LDLc at PCP on 05/2023 was 70 no change in diet/ exercise or medication in one month; will recheck Lipid lab today  Tolerates current medications well without any side effects  Intolerance to statins  Lipitor dose above 10 mg every other day  and Crestor - severe myalgia  Discussed next potential options (PCSK-9 inhibitors, and inclisiran); cost, dosing efficacy, side effects;patient prefers self injection over inclisiran   Reiterated importance of healthy diet and regular exercise   Plan: Continue taking current medications atorvastatin 10 mg every other day, bempedoic acid/ezetimibe 180/10 mg daily  Will apply for PA for PCSK9i; will inform patient upon approval  FLP : tomorrow- patient was not fasting today

## 2023-08-01 NOTE — Telephone Encounter (Signed)
Pharmacy Patient Advocate Encounter   Received notification from Pt Calls Messages that prior authorization for repatha is required/requested.   Insurance verification completed.   The patient is insured through [employer] .   Per test claim: PA required; PA submitted to [facility] via CoverMyMeds Key/confirmation #/EOC [id number] Status is pending

## 2023-08-01 NOTE — Progress Notes (Signed)
Patient ID: Leon Mitchell                 DOB: 1956-03-09                    MRN: [id number]      HPI: Leon Mitchell is a 67 y.o. male patient referred to lipid clinic by Tereso Newcomer, PA-C. 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  Patient presented today for lipid clinic. Reports his has lot of back issues, hips and knee issues that limits her exercise. Still he uses recumbent exercise bike 3 times per week, 15-20 min at a time, he got married few weeks ago. He hsue to eat lots of processed food but now he eats healthy home cooked meals. Sweets are his weakness slowly reducing intake. We discussed healthy snacks options and heathy meal options in detail. He can not tolerate Lipitor every other day but able to take it every other day without muscle spasms. He still smokes a pack per day. He is ready to quit. Right after his back surgery he was not smoking but he had relapse. Knee surgery coming up so post surgery will continue using nicotine patches, his LDLc was at goal when he got checked at PCP in 05/2023 and 30 days later it was checked here and his LDLc went up to 93. He suspects one result is not right as in that one month no significant changes he had made to his diet/exercise or medications so would like to get updated lipid lab  Reviewed options for lowering LDL cholesterol, including PCSK-9 inhibitors and inclisiran.  Discussed mechanisms of action, dosing, side effects and potential decreases in LDL cholesterol.  Also reviewed cost information and potential options for patient assistance.  Current Medications: atorvastatin 10 mg every other day, bempedoic acid/ezetimibe 180/10 mg daily  Intolerances: Lipitor and Crestor - severe myalgia  Risk Factors: CAD, STEMI-2024 s/p PCI LHC -2024, HTN, HLD, statin intolerance, tobacco use LDL goal: <70  Last lab: LDLc 93, HDL 39, TG 93, TC 150   Diet: was not on good diet but from last  one month or so he has been eating home cooked healthy meals   Exercise: 3 times per week bike 15-20 min at a time - chromic joint issues limits exercise capacity   Family History:  Relation Problem Comments  Mother (Deceased) Hypertension     Father (Deceased at age 43) Hypertension     Sister - 2 Metallurgist) Hypertension     Sister Metallurgist)   Brother - 2 Metallurgist) Hypertension     Brother Metallurgist)      Social History:   Labs: Lipid Panel     Component Value Date/Time   CHOL 150 06/13/2023 1034   TRIG 93 06/13/2023 1034   HDL 39 (L) 06/13/2023 1034   CHOLHDL 5.0 10/19/2022 0233   VLDL 15 10/19/2022 0233   LDLCALC 93 06/13/2023 1034   LABVLDL 18 06/13/2023 1034    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 Swallow whole.     atorvastatin (LIPITOR) 20 MG tablet Take 0.5 tablets (10 mg total) by mouth every other day. 60 tablet 0   Bempedoic Acid-Ezetimibe (NEXLIZET) 180-10 MG TABS Take 1 tablet by mouth daily. 90 tablet 2   diclofenac (VOLTAREN) 75 MG EC tablet Take 75 mg by mouth 2 (two) times daily.     gabapentin (NEURONTIN) 300 MG capsule Take 600 mg by mouth in the morning, at noon, and at bedtime.     lisinopril (ZESTRIL) 20 MG tablet TAKE 1 TABLET BY MOUTH DAILY 90 tablet 3   metoprolol succinate (TOPROL-XL) 25 MG 24 hr tablet TAKE 1 TABLET BY MOUTH DAILY (Patient taking differently: Take 25 mg by mouth every evening.) 90 tablet 1   nitroGLYCERIN (NITROSTAT) 0.4 MG SL tablet Place 1 tablet (0.4 mg total) under the tongue every 5 (five) minutes as  needed for up to 25 days for chest pain. 25 tablet 3   Polyethyl Glycol-Propyl Glycol (SYSTANE) 0.4-0.3 % SOLN Place 1-2 drops into both eyes 3 (three) times daily as needed (dry/irritated eyes.).     prasugrel (EFFIENT) 10 MG TABS tablet Take 1 tablet (10 mg total) by mouth daily. 90 tablet 3   tamsulosin (FLOMAX) 0.4 MG CAPS capsule Take 0.4 mg by mouth in the morning.     traMADol (ULTRAM) 50 MG tablet TAKE 1 TABLET BY MOUTH TWICE DAILY AS NEEDED 60 tablet 1   No current facility-administered medications on file prior to visit.    Allergies  Allergen Reactions   Lipitor [Atorvastatin] Other (See Comments)    Severe myalgia   Rosuvastatin Other (See Comments)    Severe myalgia    Assessment/Plan:  1. Hyperlipidemia -  Problem  Pure Hypercholesterolemia   Pure hypercholesterolemia Assessment:  LDL goal: < 70 mg/dl last LDLc 90 mg/dl (40/9811) LDLc at PCP on 05/2023 was 70 no change in diet/ exercise or medication in one month; will recheck Lipid lab today  Tolerates current medications well without any side effects  Intolerance to statins  Lipitor dose above 10 mg every other day  and Crestor - severe myalgia  Discussed next potential options (PCSK-9 inhibitors, and inclisiran); cost, dosing efficacy, side effects;patient prefers self injection over inclisiran   Reiterated importance of healthy diet and regular exercise   Plan: Continue taking current medications atorvastatin 10 mg every other day, bempedoic acid/ezetimibe 180/10 mg daily  Will apply for PA for PCSK9i; will inform patient upon approval  FLP : tomorrow- patient was not fasting today     Thank you,  Carmela Hurt, Pharm.D [facility] A Division of [facility] [address], [place], [place] [address]  Phone: [phone]; Fax: [phone]

## 2023-08-01 NOTE — H&P (Signed)
TOTAL KNEE ADMISSION H&P  Patient is being admitted for left total knee arthroplasty.  Subjective:  Chief Complaint: Left knee pain.  HPI: Leon Mitchell, 67 y.o. male has a history of pain and functional disability in the left knee due to arthritis and has failed non-surgical conservative treatments for greater than 12 weeks to include NSAID's and/or analgesics, corticosteriod injections, flexibility and strengthening excercises, use of assistive devices, and activity modification. Onset of symptoms was gradual, starting several years ago with gradually worsening course since that time. The patient noted no past surgery on the left knee.  Patient currently rates pain in the left knee at 8 out of 10 with activity. Patient has night pain, worsening of pain with activity and weight bearing, and pain that interferes with activities of daily living. Patient has evidence of  bone on bone in all 3 compartments  by imaging studies. There is no active infection.  Patient Active Problem List   Diagnosis Date Noted   OA (osteoarthritis) of hip 04/03/2023   Primary osteoarthritis of right hip 04/03/2023   Coronary artery disease involving native coronary artery of native heart without angina pectoris 10/27/2022   Acute ST elevation myocardial infarction (STEMI) of inferolateral wall (HCC) 10/19/2022   Pure hypercholesterolemia 10/19/2022   Tobacco use disorder 10/19/2022   History of left-sided carotid endarterectomy 10/19/2022   Spondylolisthesis of lumbar region 06/20/2022   Carotid artery stenosis 04/19/2022   COPD GOLD 0  / active smoker  06/17/2020   Solitary pulmonary nodule on lung CT 06/17/2020   Cigarette smoker 06/17/2020   Primary hypertension 06/17/2020   Hoffman's reflex 08/17/2017   Hyperreflexia 08/17/2017   Thoracic spondylosis with myelopathy 01/18/2016   B12 deficiency 01/18/2016   Vitamin D deficiency 01/18/2016   OA (osteoarthritis) of knee 06/05/2013   Right knee pain  06/05/2013   OA (osteoarthritis) of foot 06/05/2013    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    Past Surgical History:  Procedure Laterality Date   APPENDECTOMY     BACK SURGERY  01/12/2016   T 5&6   BACK SURGERY  04/2016   T 6-9   BACK SURGERY     Feb 2022   CATARACT EXTRACTION Bilateral    COLONOSCOPY N/A 01/06/2016   Procedure: COLONOSCOPY;  Surgeon: Malissa Hippo, MD;  [facility];  Service: Endoscopy;  Laterality: N/A;  730   CORONARY THROMBECTOMY N/A 10/19/2022   Procedure: Coronary Thrombectomy;  Surgeon: Yates Decamp, MD;  [facility] INVASIVE CV LAB;  Service: Cardiovascular;  Laterality: N/A;   CORONARY/GRAFT ACUTE MI REVASCULARIZATION N/A 10/19/2022   Procedure: Coronary/Graft Acute MI Revascularization;  Surgeon: Yates Decamp, MD;  Location: [facility] INVASIVE CV LAB;  Service: Cardiovascular;  Laterality: N/A;   ENDARTERECTOMY Left 04/19/2022   Procedure: LEFT CAROTID ENDARTERECTOMY;  Surgeon: Maeola Harman, MD;  Location: [facility] OR;  Service: Vascular;  Laterality: Left;   HAND SURGERY Left    KNEE ARTHROSCOPY Left 2006   LEFT HEART CATH AND CORONARY ANGIOGRAPHY N/A 10/19/2022   Procedure: LEFT HEART CATH AND CORONARY ANGIOGRAPHY;  Surgeon: Yates Decamp, MD;  Location: [facility] INVASIVE CV  LAB;  Service: Cardiovascular;  Laterality: N/A;   PATCH ANGIOPLASTY Left 04/19/2022   Procedure: PATCH ANGIOPLASTY using Livia Snellen Biologic Patch;  Surgeon: Maeola Harman, MD;  Location: [facility] OR;  Service: Vascular;  Laterality: Left;   TOTAL HIP ARTHROPLASTY Right 04/03/2023   Procedure: TOTAL HIP ARTHROPLASTY ANTERIOR APPROACH;  Surgeon: Ollen Gross, MD;  Location: [facility] ORS;   Service: Orthopedics;  Laterality: Right;   TRANSFORAMINAL LUMBAR INTERBODY FUSION W/ MIS 2 LEVEL N/A 06/20/2022   Procedure: Lumbar three-four, Lumbar four-five Laminectomies/resection of synovial cyst Minimally Invasive Surgery ,Transforaminal Lumbar interbody Fusion lumbar three-four lumbar four-five;  Surgeon: Jadene Pierini, MD;  Location: [facility] OR;  Service: Neurosurgery;  Laterality: N/A;    Prior to Admission medications   Medication Sig Start Date End Date Taking? Authorizing Provider  aspirin EC 81 MG tablet Take 81 mg by mouth daily. Swallow whole.   Yes [provider]  atorvastatin (LIPITOR) 20 MG tablet Take 0.5 tablets (10 mg total) by mouth every other day. 03/21/23  Yes Yates Decamp, MD  Bempedoic Acid-Ezetimibe (NEXLIZET) 180-10 MG TABS Take 1 tablet by mouth daily. 03/21/23  Yes Yates Decamp, MD  diclofenac (VOLTAREN) 75 MG EC tablet Take 75 mg by mouth 2 (two) times daily. 06/14/23  Yes [provider]  gabapentin (NEURONTIN) 300 MG capsule Take 600 mg by mouth in the morning, at noon, and at bedtime.   Yes [provider]  lisinopril (ZESTRIL) 20 MG tablet TAKE 1 TABLET BY MOUTH DAILY 03/08/23  Yes Branch, Alben Spittle, MD  metoprolol succinate (TOPROL-XL) 25 MG 24 hr tablet TAKE 1 TABLET BY MOUTH DAILY Patient taking differently: Take 25 mg by mouth every evening. 06/05/23  Yes Yates Decamp, MD  nitroGLYCERIN (NITROSTAT) 0.4 MG SL tablet Place 1 tablet (0.4 mg total) under the tongue every 5 (five) minutes as needed for up to 25 days for chest pain. 03/01/23 07/18/27 Yes Yates Decamp, MD  Polyethyl Glycol-Propyl Glycol (SYSTANE) 0.4-0.3 % SOLN Place 1-2 drops into both eyes 3 (three) times daily as needed (dry/irritated eyes.).   Yes [provider]  prasugrel (EFFIENT) 10 MG TABS tablet Take 1 tablet (10 mg total) by mouth daily. 11/24/22 11/19/23 Yes Yates Decamp, MD  tamsulosin (FLOMAX) 0.4 MG CAPS capsule Take 0.4 mg by mouth in the morning. 06/14/23  Yes  [provider]  traMADol (ULTRAM) 50 MG tablet TAKE 1 TABLET BY MOUTH TWICE DAILY AS NEEDED 07/14/23  Yes Yates Decamp, MD    Allergies  Allergen Reactions   Lipitor [Atorvastatin] Other (See Comments)    Severe myalgia   Rosuvastatin Other (See Comments)    Severe myalgia    Social History   Socioeconomic History   Marital status: Married    Spouse name: Not on file   Number of children: 2   Years of education: 13   Highest education level: Not on file  Occupational History    Comment: Games developer, disability  Tobacco Use   Smoking status: Every Day    Current packs/day: 0.50    Average packs/day: 0.5 packs/day for 40.0 years (20.0 ttl pk-yrs)    Types: Cigarettes    Passive exposure: Never   Smokeless tobacco: Never  Vaping Use   Vaping status: Never Used  Substance and Sexual Activity   Alcohol use: Yes    Alcohol/week: 1.0 standard drink of alcohol    Types: 1 Shots of liquor per week    Comment: very little; occasionally   Drug use:  No   Sexual activity: Not on file  Other Topics Concern   Not on file  Social History Narrative   Mother lives with patient   Caffeine use- 3-4 glasses tea   Social Determinants of Health   Financial Resource Strain: Not on file  Food Insecurity: No Food Insecurity (04/03/2023)   Hunger Vital Sign    Worried About Running Out of Food in the Last Year: Never true    Ran Out of Food in the Last Year: Never true  Transportation Needs: No Transportation Needs (04/03/2023)   PRAPARE - Administrator, Civil Service (Medical): No    Lack of Transportation (Non-Medical): No  Physical Activity: Not on file  Stress: Not on file  Social Connections: Not on file  Intimate Partner Violence: Not At Risk (04/03/2023)   Humiliation, Afraid, Rape, and Kick questionnaire    Fear of Current or Ex-Partner: No    Emotionally Abused: No    Physically Abused: No    Sexually Abused: No    Tobacco Use: High Risk  (08/01/2023)   Patient History    Smoking Tobacco Use: Every Day    Smokeless Tobacco Use: Never    Passive Exposure: Never   Social History   Substance and Sexual Activity  Alcohol Use Yes   Alcohol/week: 1.0 standard drink of alcohol   Types: 1 Shots of liquor per week   Comment: very little; occasionally    Family History  Problem Relation Age of Onset   Hypertension Mother    Hypertension Father    Hypertension Sister    Hypertension Brother     ROS  Objective:  Physical Exam: Well nourished and well developed.  General: Alert and oriented x3, cooperative and pleasant, no acute distress.  Head: normocephalic, atraumatic, neck supple.  Eyes: EOMI. Abdomen: non-tender to palpation and soft, normoactive bowel sounds. Musculoskeletal:  Left Knee Exam:  No effusion present. No swelling present.  The Range of motion is: 5 to 125 degrees.  Moderate crepitus on range of motion of the knee.  Positive medial joint line tenderness.  Slight lateral joint line tenderness.  The knee is stable.  Calves soft and nontender. Motor function intact in LE. Strength 5/5 LE bilaterally. Neuro: Distal pulses 2+. Sensation to light touch intact in LE.  Vital signs in last 24 hours: Temp:  [98 F (36.7 C)] 98 F (36.7 C) (10/22 1146) Pulse Rate:  [52] 52 (10/22 1146) Resp:  [16] 16 (10/22 1146) BP: (128)/(88) 128/88 (10/22 1146) SpO2:  [100 %] 100 % (10/22 1146) Weight:  [102.1 kg] 102.1 kg (10/22 1146)  Imaging Review Plain radiographs demonstrate severe degenerative joint disease of the left knee. The overall alignment is neutral. The bone quality appears to be adequate for age and reported activity level.  Assessment/Plan:  End stage arthritis, left knee   The patient history, physical examination, clinical judgment of the provider and imaging studies are consistent with end stage degenerative joint disease of the left knee and total knee arthroplasty is deemed medically  necessary. The treatment options including medical management, injection therapy arthroscopy and arthroplasty were discussed at length. The risks and benefits of total knee arthroplasty were presented and reviewed. The risks due to aseptic loosening, infection, stiffness, patella tracking problems, thromboembolic complications and other imponderables were discussed. The patient acknowledged the explanation, agreed to proceed with the plan and consent was signed. Patient is being admitted for inpatient treatment for surgery, pain control, PT, OT, prophylactic antibiotics,  VTE prophylaxis, progressive ambulation and ADLs and discharge planning. The patient is planning to be discharged  home .  Patient's anticipated LOS is less than 2 midnights, meeting these requirements: - Lives within 1 hour of care - Has a competent adult at home to recover with post-op - NO history of  - Chronic pain requiring opiods  - Diabetes  - Coronary Artery Disease  - Heart failure  - Heart attack  - Stroke  - DVT/VTE  - Cardiac arrhythmia  - Respiratory Failure/COPD  - Renal failure  - Anemia  - Advanced Liver disease  Therapy Plans: [facility] [employer] Disposition: Home with Wife Planned DVT Prophylaxis: Prasugrel + ASA DME Needed: None PCP: Doreen Beam, MD (requesting cardiac clearance) Cardiologist: Yates Decamp, MD (clearance received) TXA: IV Allergies: rosuvastatin (myalgia) Anesthesia Concerns: None BMI: 31.1 Last HgbA1c: not diabetic  Pharmacy: [facility]  Other: -Hold Prasugrel 5 days prior to surgery -Currently takes tramadol PRN  - Patient was instructed on what medications to stop prior to surgery. - Follow-up visit in 2 weeks with Dr. Lequita Halt - Begin physical therapy following surgery - Pre-operative lab work as pre-surgical testing - Prescriptions will be provided in hospital at time of discharge  R. Arcola Jansky, PA-C Orthopedic Surgery [facility] [place]

## 2023-08-01 NOTE — Telephone Encounter (Signed)
Pharmacy Patient Advocate Encounter  Received notification from [facility] that Prior Authorization for repatha has been APPROVED from 10/10/22 to 10/09/24. Ran test claim, Copay is $335.99- one month (DEDUCTIBLE) . This test claim was processed through [facility]- copay amounts may vary at other pharmacies due to pharmacy/plan contracts, or as the patient moves through the different stages of their insurance plan.   PA #/Case ID/Reference #: [id number]

## 2023-08-02 DIAGNOSIS — E7800 Pure hypercholesterolemia, unspecified: Secondary | ICD-10-CM | POA: Diagnosis not present

## 2023-08-02 NOTE — Anesthesia Preprocedure Evaluation (Addendum)
Anesthesia Evaluation  Patient identified by MRN, date of birth, ID band Patient awake    Reviewed: Allergy & Precautions, NPO status , Patient's Chart, lab work & pertinent test results  Airway Mallampati: II  TM Distance: >3 FB Neck ROM: Full    Dental  (+) Teeth Intact, Dental Advisory Given   Pulmonary COPD, Current Smoker and Patient abstained from smoking.   breath sounds clear to auscultation       Cardiovascular hypertension, Pt. on medications and Pt. on home beta blockers + CAD and + Past MI   Rhythm:Regular Rate:Normal     Neuro/Psych negative neurological ROS  negative psych ROS   GI/Hepatic Neg liver ROS,GERD  ,,  Endo/Other  negative endocrine ROS    Renal/GU negative Renal ROS     Musculoskeletal  (+) Arthritis ,    Abdominal   Peds  Hematology negative hematology ROS (+)   Anesthesia Other Findings   Reproductive/Obstetrics                             Anesthesia Physical Anesthesia Plan  ASA: 3  Anesthesia Plan: General   Post-op Pain Management: Regional block*   Induction: Intravenous  PONV Risk Score and Plan: 2 and Ondansetron, Dexamethasone, Propofol infusion, Midazolam and TIVA  Airway Management Planned: LMA  Additional Equipment: None  Intra-op Plan:   Post-operative Plan: Extubation in OR  Informed Consent: I have reviewed the patients History and Physical, chart, labs and discussed the procedure including the risks, benefits and alternatives for the proposed anesthesia with the patient or authorized representative who has indicated his/her understanding and acceptance.     Dental advisory given  Plan Discussed with: CRNA  Anesthesia Plan Comments: (See PAT note 07/31/2023 Due to extensive back surgery and hardware, will not perform spinal anesthetic.   Lab Results      Component                Value               Date                       WBC                      6.6                 08/01/2023                HGB                      16.0                08/01/2023                HCT                      47.2                08/01/2023                MCV                      96.9                08/01/2023                PLT  250                 08/01/2023           )       Anesthesia Quick Evaluation

## 2023-08-02 NOTE — Progress Notes (Signed)
Anesthesia Chart Review   Case: [id number] Date/Time: 08/07/23 1015   Procedure: TOTAL KNEE ARTHROPLASTY (Left: Knee)   Anesthesia type: Choice   Pre-op diagnosis: left knee osteoarthritis   [facility]OM 10 / [facility]   Surgeons: Ollen Gross, MD       DISCUSSION:67 y.o. smoker with h/o HTN, COPD, CAD (DES to proximal RCA 10/2022), left knee OA scheduled for above procedure 08/07/23 with Dr. Ollen Gross.   H/o multiple back surgeries. Lumbar 3-5 fusion.   Per cardiology preoperative evaluation 04/26/2023, "BURDETT TIN is at low risk, from a cardiac standpoint, for his upcoming procedure: Left total knee arthroplasty under choice anesthesia.  It is ok to proceed without further cardiac testing.   Patient is pleasant on aspirin 81 mg daily, please continue this periprocedurally.  He was on prasugrel/Effient 10 mg daily, this has been discontinued 5 days prior to the procedure.  Patient does not have to resume Effient postprocedure."  Most recently seen by cardiology 07/05/2023. Stable at this visit without cv sx.   At PAT visit pt reports he has not been given instructions on when to hold Effient.   Spoke with patient, last dose of Effient AM of 08/01/2023.  VS: BP 128/88   Pulse (!) 52   Temp 36.7 C (Oral)   Resp 16   Ht 6' (1.829 m)   Wt 102.1 kg   SpO2 100%   BMI 30.52 kg/m   PROVIDERS: Ignatius Specking, MD is PCP   Yates Decamp, MD is Cardiologist  LABS: Labs reviewed: Acceptable for surgery. (all labs ordered are listed, but only abnormal results are displayed)  Labs Reviewed  BASIC METABOLIC PANEL - Abnormal; Notable for the following components:      Result Value   BUN 26 (*)    All other components within normal limits  SURGICAL PCR SCREEN  CBC     IMAGES:   EKG:   CV: Echo 10/19/2022  1. Left ventricular ejection fraction, by estimation, is 60 to 65%. The  left ventricle has normal function. The left ventricle has no regional  wall motion  abnormalities. Left ventricular diastolic parameters were  normal.   2. Right ventricular systolic function is normal. The right ventricular  size is normal.   3. The mitral valve is normal in structure. No evidence of mitral valve  regurgitation. No evidence of mitral stenosis.   4. The aortic valve is normal in structure. Aortic valve regurgitation is  mild. No aortic stenosis is present.   5. The inferior vena cava is normal in size with greater than 50%  respiratory variability, suggesting right atrial pressure of 3 mmHg.  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    Past Surgical History:  Procedure Laterality Date   APPENDECTOMY     BACK SURGERY  01/12/2016   T 5&6   BACK SURGERY  04/2016   T 6-9   BACK SURGERY     Feb 2022   CATARACT EXTRACTION Bilateral    COLONOSCOPY N/A 01/06/2016   Procedure: COLONOSCOPY;  Surgeon: Malissa Hippo, MD;  [facility];  Service: Endoscopy;  Laterality: N/A;  730   CORONARY THROMBECTOMY N/A 10/19/2022   Procedure: Coronary Thrombectomy;  Surgeon: Yates Decamp, MD;  Location: [facility] INVASIVE CV LAB;  Service: Cardiovascular;  Laterality: N/A;   CORONARY/GRAFT ACUTE MI REVASCULARIZATION N/A 10/19/2022   Procedure: Coronary/Graft Acute MI Revascularization;  Surgeon: Yates Decamp, MD;  [facility]: [facility] INVASIVE CV LAB;  Service: Cardiovascular;  Laterality: N/A;   ENDARTERECTOMY Left 04/19/2022   Procedure: LEFT CAROTID ENDARTERECTOMY;  Surgeon: Maeola Harman, MD;  Location: [facility] OR;  Service: Vascular;  Laterality: Left;   HAND SURGERY Left    KNEE ARTHROSCOPY Left 2006   LEFT HEART CATH AND CORONARY ANGIOGRAPHY N/A 10/19/2022   Procedure: LEFT  HEART CATH AND CORONARY ANGIOGRAPHY;  Surgeon: Yates Decamp, MD;  [facility] INVASIVE CV LAB;  Service: Cardiovascular;  Laterality: N/A;   PATCH ANGIOPLASTY Left 04/19/2022   Procedure: PATCH ANGIOPLASTY using Livia Snellen Biologic Patch;  Surgeon: Maeola Harman, MD;  Location: [facility] OR;  Service: Vascular;  Laterality: Left;   TOTAL HIP ARTHROPLASTY Right 04/03/2023   Procedure: TOTAL HIP ARTHROPLASTY ANTERIOR APPROACH;  Surgeon: Ollen Gross, MD;  Location: [facility];  Service: Orthopedics;  Laterality: Right;   TRANSFORAMINAL LUMBAR INTERBODY FUSION W/ MIS 2 LEVEL N/A 06/20/2022   Procedure: Lumbar three-four, Lumbar four-five Laminectomies/resection of synovial cyst Minimally Invasive Surgery ,Transforaminal Lumbar interbody Fusion lumbar three-four lumbar four-five;  Surgeon: Jadene Pierini, MD;  Location: [facility] OR;  Service: Neurosurgery;  Laterality: N/A;    MEDICATIONS:  aspirin EC 81 MG tablet   atorvastatin (LIPITOR) 20 MG tablet   Bempedoic Acid-Ezetimibe (NEXLIZET) 180-10 MG TABS   diclofenac (VOLTAREN) 75 MG EC tablet   gabapentin (NEURONTIN) 300 MG capsule   lisinopril (ZESTRIL) 20 MG tablet   metoprolol succinate (TOPROL-XL) 25 MG 24 hr tablet   nitroGLYCERIN (NITROSTAT) 0.4 MG SL tablet   Polyethyl Glycol-Propyl Glycol (SYSTANE) 0.4-0.3 % SOLN   prasugrel (EFFIENT) 10 MG TABS tablet   tamsulosin (FLOMAX) 0.4 MG CAPS capsule   traMADol (ULTRAM) 50 MG tablet   No current facility-administered medications for this encounter.    Jodell Cipro Ward, PA-C [facility] Pre-Surgical Testing [phone]

## 2023-08-03 LAB — LIPID PANEL
Chol/HDL Ratio: 4.3 ratio (ref 0.0–5.0)
Cholesterol, Total: 145 mg/dL (ref 100–199)
HDL: 34 mg/dL — ABNORMAL LOW (ref >39)
LDL Chol Calc (NIH): 89 mg/dL (ref 0–99)
Triglycerides: 122 mg/dL (ref 0–149)
VLDL Cholesterol Cal: 22 mg/dL (ref 5–40)

## 2023-08-03 MED ORDER — REPATHA SURECLICK 140 MG/ML ~~LOC~~ SOAJ: 140.0000 mg | SUBCUTANEOUS | 3 refills | Status: AC

## 2023-08-03 NOTE — Addendum Note (Signed)
Addended by: Tylene Fantasia on: 08/03/2023 10:19 AM   Modules accepted: Orders

## 2023-08-03 NOTE — Telephone Encounter (Signed)
spoke to patient about recent lab,Repatha PA approval and co-pay. Patient will get one month supply of Repatha and will start taking every 3 weeks and when [facility] re-open we will enroll him in the gant so that he can afford Repatha. Follow up FLP due on Jan 30,2025.

## 2023-08-07 ENCOUNTER — Other Ambulatory Visit: Payer: Self-pay

## 2023-08-07 ENCOUNTER — Observation Stay (HOSPITAL_COMMUNITY)
Admission: RE | Admit: 2023-08-07 | Discharge: 2023-08-08 | Disposition: A | Discharge status: Home / Self Care | Payer: Medicare Other | Source: Ambulatory Visit | Attending: Orthopedic Surgery | Admitting: Orthopedic Surgery

## 2023-08-07 ENCOUNTER — Ambulatory Visit (HOSPITAL_COMMUNITY): Payer: Medicare Other | Admitting: Physician Assistant

## 2023-08-07 ENCOUNTER — Encounter (HOSPITAL_COMMUNITY): Payer: Self-pay | Admitting: Orthopedic Surgery

## 2023-08-07 ENCOUNTER — Encounter (HOSPITAL_COMMUNITY)
Admission: RE | Disposition: A | Discharge status: Home / Self Care | Payer: Self-pay | Source: Ambulatory Visit | Attending: Orthopedic Surgery

## 2023-08-07 ENCOUNTER — Ambulatory Visit (HOSPITAL_COMMUNITY): Payer: Medicare Other | Admitting: Anesthesiology

## 2023-08-07 ENCOUNTER — Other Ambulatory Visit (HOSPITAL_COMMUNITY): Payer: Self-pay

## 2023-08-07 DIAGNOSIS — F17210 Nicotine dependence, cigarettes, uncomplicated: Secondary | ICD-10-CM

## 2023-08-07 DIAGNOSIS — I2510 Atherosclerotic heart disease of native coronary artery without angina pectoris: Secondary | ICD-10-CM | POA: Insufficient documentation

## 2023-08-07 DIAGNOSIS — Z7982 Long term (current) use of aspirin: Secondary | ICD-10-CM | POA: Diagnosis not present

## 2023-08-07 DIAGNOSIS — M1712 Unilateral primary osteoarthritis, left knee: Principal | ICD-10-CM | POA: Diagnosis present

## 2023-08-07 DIAGNOSIS — Z79899 Other long term (current) drug therapy: Secondary | ICD-10-CM | POA: Diagnosis not present

## 2023-08-07 DIAGNOSIS — J449 Chronic obstructive pulmonary disease, unspecified: Secondary | ICD-10-CM | POA: Insufficient documentation

## 2023-08-07 DIAGNOSIS — Z96641 Presence of right artificial hip joint: Secondary | ICD-10-CM | POA: Insufficient documentation

## 2023-08-07 DIAGNOSIS — M179 Osteoarthritis of knee, unspecified: Principal | ICD-10-CM | POA: Diagnosis present

## 2023-08-07 DIAGNOSIS — Z01818 Encounter for other preprocedural examination: Secondary | ICD-10-CM

## 2023-08-07 DIAGNOSIS — I10 Essential (primary) hypertension: Secondary | ICD-10-CM | POA: Diagnosis not present

## 2023-08-07 DIAGNOSIS — G8918 Other acute postprocedural pain: Secondary | ICD-10-CM | POA: Diagnosis not present

## 2023-08-07 HISTORY — PX: TOTAL KNEE ARTHROPLASTY: SHX125

## 2023-08-07 SURGERY — ARTHROPLASTY, KNEE, TOTAL
Anesthesia: General | Site: Knee | Laterality: Left

## 2023-08-07 MED ORDER — PROPOFOL 500 MG/50ML IV EMUL
INTRAVENOUS | Status: DC | PRN
  Administered 2023-08-07: 150 ug/kg/min via INTRAVENOUS

## 2023-08-07 MED ORDER — 0.9 % SODIUM CHLORIDE (POUR BTL) OPTIME
TOPICAL | Status: DC | PRN
  Administered 2023-08-07: 1000 mL

## 2023-08-07 MED ORDER — CHLORHEXIDINE GLUCONATE 4 % EX SOLN
1.0000 | CUTANEOUS | 1 refills | Status: DC
  Filled 2023-08-07: qty 946, 42d supply, fill #0

## 2023-08-07 MED ORDER — PHENYLEPHRINE HCL (PRESSORS) 10 MG/ML IV SOLN
INTRAVENOUS | Status: DC | PRN
Start: 2023-08-07 — End: 2023-08-07
  Administered 2023-08-07 (×3): 160 ug via INTRAVENOUS

## 2023-08-07 MED ORDER — PROPOFOL 10 MG/ML IV BOLUS
INTRAVENOUS | Status: AC
  Filled 2023-08-07: qty 20

## 2023-08-07 MED ORDER — HYDROMORPHONE HCL 1 MG/ML IJ SOLN
INTRAMUSCULAR | Status: AC
  Filled 2023-08-07: qty 1

## 2023-08-07 MED ORDER — DOCUSATE SODIUM 100 MG PO CAPS
100.0000 mg | ORAL_CAPSULE | Freq: Two times a day (BID) | ORAL | Status: DC
  Administered 2023-08-07 – 2023-08-08 (×2): 100 mg via ORAL
  Filled 2023-08-07 (×2): qty 1

## 2023-08-07 MED ORDER — ONDANSETRON HCL 4 MG/2ML IJ SOLN
INTRAMUSCULAR | Status: DC | PRN
  Administered 2023-08-07: 4 mg via INTRAVENOUS

## 2023-08-07 MED ORDER — ACETAMINOPHEN 160 MG/5ML PO SOLN: 325.0000 mg | Freq: Once | ORAL | Status: DC | PRN

## 2023-08-07 MED ORDER — ROPIVACAINE HCL 5 MG/ML IJ SOLN
INTRAMUSCULAR | Status: DC | PRN
  Administered 2023-08-07: 30 mL via PERINEURAL

## 2023-08-07 MED ORDER — NITROGLYCERIN 0.4 MG SL SUBL: 0.4000 mg | SUBLINGUAL_TABLET | SUBLINGUAL | Status: DC | PRN

## 2023-08-07 MED ORDER — POLYETHYLENE GLYCOL 3350 17 G PO PACK
17.0000 g | PACK | Freq: Every day | ORAL | Status: DC | PRN
Start: 2023-08-07 — End: 2023-08-08

## 2023-08-07 MED ORDER — DEXAMETHASONE SODIUM PHOSPHATE 10 MG/ML IJ SOLN
10.0000 mg | Freq: Once | INTRAMUSCULAR | Status: AC
  Administered 2023-08-08: 10 mg via INTRAVENOUS
  Filled 2023-08-07: qty 1

## 2023-08-07 MED ORDER — BISACODYL 10 MG RE SUPP: 10.0000 mg | Freq: Every day | RECTAL | Status: DC | PRN

## 2023-08-07 MED ORDER — GABAPENTIN 300 MG PO CAPS
600.0000 mg | ORAL_CAPSULE | Freq: Three times a day (TID) | ORAL | Status: DC
  Administered 2023-08-07 – 2023-08-08 (×3): 600 mg via ORAL
  Filled 2023-08-07 (×3): qty 2

## 2023-08-07 MED ORDER — TRANEXAMIC ACID-NACL 1000-0.7 MG/100ML-% IV SOLN
1000.0000 mg | INTRAVENOUS | Status: AC
  Administered 2023-08-07: 1000 mg via INTRAVENOUS
  Filled 2023-08-07: qty 100

## 2023-08-07 MED ORDER — METOPROLOL SUCCINATE ER 25 MG PO TB24
25.0000 mg | ORAL_TABLET | Freq: Every evening | ORAL | Status: DC
  Administered 2023-08-07: 25 mg via ORAL
  Filled 2023-08-07: qty 1

## 2023-08-07 MED ORDER — ATORVASTATIN CALCIUM 10 MG PO TABS
10.0000 mg | ORAL_TABLET | ORAL | Status: DC
  Administered 2023-08-08: 10 mg via ORAL
  Filled 2023-08-07: qty 1

## 2023-08-07 MED ORDER — METHOCARBAMOL 1000 MG/10ML IJ SOLN
500.0000 mg | Freq: Four times a day (QID) | INTRAMUSCULAR | Status: DC | PRN
  Administered 2023-08-07: 500 mg via INTRAVENOUS

## 2023-08-07 MED ORDER — ACETAMINOPHEN 500 MG PO TABS
1000.0000 mg | ORAL_TABLET | Freq: Four times a day (QID) | ORAL | Status: DC
  Administered 2023-08-07 – 2023-08-08 (×3): 1000 mg via ORAL
  Filled 2023-08-07 (×3): qty 2

## 2023-08-07 MED ORDER — PROPOFOL 1000 MG/100ML IV EMUL
INTRAVENOUS | Status: AC
  Filled 2023-08-07: qty 100

## 2023-08-07 MED ORDER — SODIUM CHLORIDE (PF) 0.9 % IJ SOLN
INTRAMUSCULAR | Status: AC
  Filled 2023-08-07: qty 10

## 2023-08-07 MED ORDER — DIPHENHYDRAMINE HCL 12.5 MG/5ML PO ELIX: 12.5000 mg | ORAL_SOLUTION | ORAL | Status: DC | PRN

## 2023-08-07 MED ORDER — PHENOL 1.4 % MT LIQD: 1.0000 | OROMUCOSAL | Status: DC | PRN

## 2023-08-07 MED ORDER — TRAMADOL HCL 50 MG PO TABS
50.0000 mg | ORAL_TABLET | Freq: Four times a day (QID) | ORAL | Status: DC | PRN
  Administered 2023-08-07: 100 mg via ORAL
  Filled 2023-08-07: qty 2

## 2023-08-07 MED ORDER — BUPIVACAINE LIPOSOME 1.3 % IJ SUSP: 20.0000 mL | Freq: Once | INTRAMUSCULAR | Status: DC

## 2023-08-07 MED ORDER — SODIUM CHLORIDE 0.9% FLUSH: 10.0000 mL | Freq: Two times a day (BID) | INTRAVENOUS | Status: DC

## 2023-08-07 MED ORDER — ACETAMINOPHEN 10 MG/ML IV SOLN: 1000.0000 mg | Freq: Once | INTRAVENOUS | Status: DC | PRN

## 2023-08-07 MED ORDER — ACETAMINOPHEN 10 MG/ML IV SOLN
1000.0000 mg | Freq: Once | INTRAVENOUS | Status: AC
  Administered 2023-08-07: 1000 mg via INTRAVENOUS
  Filled 2023-08-07: qty 100

## 2023-08-07 MED ORDER — BUPIVACAINE LIPOSOME 1.3 % IJ SUSP
INTRAMUSCULAR | Status: AC
  Filled 2023-08-07: qty 20

## 2023-08-07 MED ORDER — ONDANSETRON HCL 4 MG PO TABS: 4.0000 mg | ORAL_TABLET | Freq: Four times a day (QID) | ORAL | Status: DC | PRN

## 2023-08-07 MED ORDER — CEFAZOLIN SODIUM-DEXTROSE 2-4 GM/100ML-% IV SOLN
2.0000 g | Freq: Four times a day (QID) | INTRAVENOUS | Status: AC
  Administered 2023-08-07 (×2): 2 g via INTRAVENOUS
  Filled 2023-08-07 (×2): qty 100

## 2023-08-07 MED ORDER — MORPHINE SULFATE (PF) 2 MG/ML IV SOLN: 1.0000 mg | INTRAVENOUS | Status: DC | PRN

## 2023-08-07 MED ORDER — DEXAMETHASONE SODIUM PHOSPHATE 4 MG/ML IJ SOLN: INTRAMUSCULAR | Status: DC | PRN

## 2023-08-07 MED ORDER — PHENYLEPHRINE 80 MCG/ML (10ML) SYRINGE FOR IV PUSH (FOR BLOOD PRESSURE SUPPORT)
PREFILLED_SYRINGE | INTRAVENOUS | Status: AC
Start: 2023-08-07 — End: ?
  Filled 2023-08-07: qty 10

## 2023-08-07 MED ORDER — DEXAMETHASONE SODIUM PHOSPHATE 10 MG/ML IJ SOLN
8.0000 mg | Freq: Once | INTRAMUSCULAR | Status: AC
  Administered 2023-08-07: 8 mg via INTRAVENOUS

## 2023-08-07 MED ORDER — LIDOCAINE HCL (CARDIAC) PF 100 MG/5ML IV SOSY
PREFILLED_SYRINGE | INTRAVENOUS | Status: DC | PRN
  Administered 2023-08-07: 40 mg via INTRAVENOUS

## 2023-08-07 MED ORDER — BEMPEDOIC ACID-EZETIMIBE 180-10 MG PO TABS: 1.0000 | ORAL_TABLET | Freq: Every day | ORAL | Status: DC

## 2023-08-07 MED ORDER — EPHEDRINE SULFATE-NACL 50-0.9 MG/10ML-% IV SOSY
PREFILLED_SYRINGE | INTRAVENOUS | Status: DC | PRN
Start: 2023-08-07 — End: 2023-08-07
  Administered 2023-08-07 (×4): 10 mg via INTRAVENOUS

## 2023-08-07 MED ORDER — FENTANYL CITRATE PF 50 MCG/ML IJ SOSY
100.0000 ug | PREFILLED_SYRINGE | Freq: Once | INTRAMUSCULAR | Status: AC
  Administered 2023-08-07: 50 ug via INTRAVENOUS
  Filled 2023-08-07: qty 2

## 2023-08-07 MED ORDER — METHOCARBAMOL 500 MG PO TABS
500.0000 mg | ORAL_TABLET | Freq: Four times a day (QID) | ORAL | Status: DC | PRN
  Administered 2023-08-07: 500 mg via ORAL
  Filled 2023-08-07: qty 1

## 2023-08-07 MED ORDER — KETAMINE HCL 10 MG/ML IJ SOLN
INTRAMUSCULAR | Status: DC | PRN
  Administered 2023-08-07: 30 mg via INTRAVENOUS

## 2023-08-07 MED ORDER — TAMSULOSIN HCL 0.4 MG PO CAPS
0.4000 mg | ORAL_CAPSULE | Freq: Every day | ORAL | Status: DC
  Administered 2023-08-08: 0.4 mg via ORAL
  Filled 2023-08-07: qty 1

## 2023-08-07 MED ORDER — METOCLOPRAMIDE HCL 5 MG PO TABS: 5.0000 mg | ORAL_TABLET | Freq: Three times a day (TID) | ORAL | Status: DC | PRN

## 2023-08-07 MED ORDER — STERILE WATER FOR IRRIGATION IR SOLN
Status: DC | PRN
  Administered 2023-08-07: 1000 mL

## 2023-08-07 MED ORDER — ASPIRIN 81 MG PO CHEW
81.0000 mg | CHEWABLE_TABLET | Freq: Two times a day (BID) | ORAL | Status: DC
  Administered 2023-08-08: 81 mg via ORAL
  Filled 2023-08-07: qty 1

## 2023-08-07 MED ORDER — POVIDONE-IODINE 10 % EX SWAB: 2.0000 | Freq: Once | CUTANEOUS | Status: DC

## 2023-08-07 MED ORDER — ACETAMINOPHEN 325 MG PO TABS: 325.0000 mg | ORAL_TABLET | Freq: Once | ORAL | Status: DC | PRN

## 2023-08-07 MED ORDER — OXYCODONE HCL 5 MG PO TABS
5.0000 mg | ORAL_TABLET | ORAL | Status: DC | PRN
  Administered 2023-08-07: 5 mg via ORAL
  Administered 2023-08-07: 10 mg via ORAL
  Administered 2023-08-08: 5 mg via ORAL
  Administered 2023-08-08: 10 mg via ORAL
  Filled 2023-08-07: qty 2
  Filled 2023-08-07: qty 1
  Filled 2023-08-07: qty 2
  Filled 2023-08-07 (×2): qty 1

## 2023-08-07 MED ORDER — PROPOFOL 10 MG/ML IV BOLUS
INTRAVENOUS | Status: DC | PRN
  Administered 2023-08-07: 150 mg via INTRAVENOUS

## 2023-08-07 MED ORDER — LACTATED RINGERS IV SOLN: INTRAVENOUS | Status: DC

## 2023-08-07 MED ORDER — MIDAZOLAM HCL 2 MG/2ML IJ SOLN
2.0000 mg | Freq: Once | INTRAMUSCULAR | Status: AC
  Administered 2023-08-07: 2 mg via INTRAVENOUS
  Filled 2023-08-07: qty 2

## 2023-08-07 MED ORDER — METOCLOPRAMIDE HCL 5 MG/ML IJ SOLN: 5.0000 mg | Freq: Three times a day (TID) | INTRAMUSCULAR | Status: DC | PRN

## 2023-08-07 MED ORDER — FLEET ENEMA RE ENEM: 1.0000 | ENEMA | Freq: Once | RECTAL | Status: DC | PRN

## 2023-08-07 MED ORDER — KETAMINE HCL 50 MG/5ML IJ SOSY
PREFILLED_SYRINGE | INTRAMUSCULAR | Status: AC
  Filled 2023-08-07: qty 5

## 2023-08-07 MED ORDER — SODIUM CHLORIDE 0.9 % IV SOLN: INTRAVENOUS | Status: DC

## 2023-08-07 MED ORDER — PRASUGREL HCL 10 MG PO TABS
10.0000 mg | ORAL_TABLET | Freq: Every day | ORAL | Status: DC
  Administered 2023-08-08: 10 mg via ORAL
  Filled 2023-08-07: qty 1

## 2023-08-07 MED ORDER — CEFAZOLIN SODIUM-DEXTROSE 2-4 GM/100ML-% IV SOLN
2.0000 g | INTRAVENOUS | Status: AC
  Administered 2023-08-07: 2 g via INTRAVENOUS
  Filled 2023-08-07: qty 100

## 2023-08-07 MED ORDER — DROPERIDOL 2.5 MG/ML IJ SOLN: 0.6250 mg | Freq: Once | INTRAMUSCULAR | Status: DC | PRN

## 2023-08-07 MED ORDER — MEPERIDINE HCL 50 MG/ML IJ SOLN: 6.2500 mg | INTRAMUSCULAR | Status: DC | PRN

## 2023-08-07 MED ORDER — HYDROMORPHONE HCL 1 MG/ML IJ SOLN
0.2500 mg | INTRAMUSCULAR | Status: DC | PRN
  Administered 2023-08-07 (×4): 0.5 mg via INTRAVENOUS

## 2023-08-07 MED ORDER — SODIUM CHLORIDE 0.9 % IR SOLN
Status: DC | PRN
  Administered 2023-08-07: 1000 mL

## 2023-08-07 MED ORDER — SODIUM CHLORIDE 0.9 % IV SOLN
INTRAVENOUS | Status: DC | PRN
  Administered 2023-08-07: 80 mL

## 2023-08-07 MED ORDER — EPHEDRINE 5 MG/ML INJ
INTRAVENOUS | Status: AC
  Filled 2023-08-07: qty 5

## 2023-08-07 MED ORDER — METHOCARBAMOL 1000 MG/10ML IJ SOLN
INTRAMUSCULAR | Status: AC
  Filled 2023-08-07: qty 10

## 2023-08-07 MED ORDER — MENTHOL 3 MG MT LOZG: 1.0000 | LOZENGE | OROMUCOSAL | Status: DC | PRN

## 2023-08-07 MED ORDER — FENTANYL CITRATE (PF) 100 MCG/2ML IJ SOLN
INTRAMUSCULAR | Status: DC | PRN
Start: 2023-08-07 — End: 2023-08-07
  Administered 2023-08-07: 25 ug via INTRAVENOUS
  Administered 2023-08-07: 50 ug via INTRAVENOUS
  Administered 2023-08-07: 25 ug via INTRAVENOUS

## 2023-08-07 MED ORDER — ONDANSETRON HCL 4 MG/2ML IJ SOLN: 4.0000 mg | Freq: Four times a day (QID) | INTRAMUSCULAR | Status: DC | PRN

## 2023-08-07 MED ORDER — ORAL CARE MOUTH RINSE: 15.0000 mL | Freq: Once | OROMUCOSAL | Status: AC

## 2023-08-07 MED ORDER — FENTANYL CITRATE (PF) 100 MCG/2ML IJ SOLN
INTRAMUSCULAR | Status: AC
  Filled 2023-08-07: qty 2

## 2023-08-07 MED ORDER — MUPIROCIN 2 % EX OINT
1.0000 | TOPICAL_OINTMENT | Freq: Two times a day (BID) | CUTANEOUS | 0 refills | Status: DC
  Filled 2023-08-07: qty 66, 42d supply, fill #0

## 2023-08-07 MED ORDER — CHLORHEXIDINE GLUCONATE 0.12 % MT SOLN
15.0000 mL | Freq: Once | OROMUCOSAL | Status: AC
  Administered 2023-08-07: 15 mL via OROMUCOSAL

## 2023-08-07 MED ORDER — SODIUM CHLORIDE (PF) 0.9 % IJ SOLN
INTRAMUSCULAR | Status: AC
  Filled 2023-08-07: qty 50

## 2023-08-07 SURGICAL SUPPLY — 51 items
ADH SKN CLS APL DERMABOND .7 (GAUZE/BANDAGES/DRESSINGS) ×1
ATTUNE MED DOME PAT 41 KNEE (Knees) IMPLANT
ATTUNE PS FEM LT SZ 8 CEM KNEE (Femur) IMPLANT
ATTUNE PSRP INSR SZ8 7 KNEE (Insert) IMPLANT
BAG COUNTER SPONGE SURGICOUNT (BAG) IMPLANT
BAG SPEC THK2 15X12 ZIP CLS (MISCELLANEOUS) ×1
BAG SPNG CNTER NS LX DISP (BAG)
BAG ZIPLOCK 12X15 (MISCELLANEOUS) ×2 IMPLANT
BASE TIBIAL ROT PLAT SZ 7 KNEE (Knees) IMPLANT
BLADE SAG 18X100X1.27 (BLADE) ×2 IMPLANT
BLADE SAW SGTL 11.0X1.19X90.0M (BLADE) ×2 IMPLANT
BNDG CMPR 6 X 5 YARDS HK CLSR (GAUZE/BANDAGES/DRESSINGS) ×1
BNDG ELASTIC 6INX 5YD STR LF (GAUZE/BANDAGES/DRESSINGS) ×2 IMPLANT
BOWL SMART MIX CTS (DISPOSABLE) ×2 IMPLANT
BSPLAT TIB 7 CMNT ROT PLAT STR (Knees) ×1 IMPLANT
CEMENT HV SMART SET (Cement) ×4 IMPLANT
COVER SURGICAL LIGHT HANDLE (MISCELLANEOUS) ×2 IMPLANT
CUFF TOURN SGL QUICK 34 (TOURNIQUET CUFF) ×1
CUFF TRNQT CYL 34X4.125X (TOURNIQUET CUFF) ×2 IMPLANT
DERMABOND ADVANCED .7 DNX12 (GAUZE/BANDAGES/DRESSINGS) ×2 IMPLANT
DRAPE U-SHAPE 47X51 STRL (DRAPES) ×2 IMPLANT
DRSG AQUACEL AG ADV 3.5X10 (GAUZE/BANDAGES/DRESSINGS) ×2 IMPLANT
DURAPREP 26ML APPLICATOR (WOUND CARE) ×2 IMPLANT
ELECT REM PT RETURN 15FT ADLT (MISCELLANEOUS) ×2 IMPLANT
GLOVE BIO SURGEON STRL SZ 6.5 (GLOVE) IMPLANT
GLOVE BIO SURGEON STRL SZ8 (GLOVE) ×2 IMPLANT
GLOVE BIOGEL PI IND STRL 6.5 (GLOVE) IMPLANT
GLOVE BIOGEL PI IND STRL 7.0 (GLOVE) IMPLANT
GLOVE BIOGEL PI IND STRL 8 (GLOVE) ×2 IMPLANT
GOWN STRL REUS W/ TWL LRG LVL3 (GOWN DISPOSABLE) ×2 IMPLANT
GOWN STRL REUS W/TWL LRG LVL3 (GOWN DISPOSABLE) ×1
HANDPIECE INTERPULSE COAX TIP (DISPOSABLE) ×1
IMMOBILIZER KNEE 20 (SOFTGOODS) ×1
IMMOBILIZER KNEE 20 THIGH 36 (SOFTGOODS) ×2 IMPLANT
KIT TURNOVER KIT A (KITS) IMPLANT
MANIFOLD NEPTUNE II (INSTRUMENTS) ×2 IMPLANT
NS IRRIG 1000ML POUR BTL (IV SOLUTION) ×2 IMPLANT
PACK TOTAL KNEE CUSTOM (KITS) ×2 IMPLANT
PADDING CAST COTTON 6X4 STRL (CAST SUPPLIES) ×4 IMPLANT
PIN STEINMAN FIXATION KNEE (PIN) IMPLANT
PROTECTOR NERVE ULNAR (MISCELLANEOUS) ×2 IMPLANT
SET HNDPC FAN SPRY TIP SCT (DISPOSABLE) ×2 IMPLANT
SUT MNCRL AB 4-0 PS2 18 (SUTURE) ×2 IMPLANT
SUT STRATAFIX 0 PDS 27 VIOLET (SUTURE) ×1
SUT VIC AB 2-0 CT1 27 (SUTURE) ×3
SUT VIC AB 2-0 CT1 TAPERPNT 27 (SUTURE) ×6 IMPLANT
SUTURE STRATFX 0 PDS 27 VIOLET (SUTURE) ×2 IMPLANT
TIBIAL BASE ROT PLAT SZ 7 KNEE (Knees) ×1 IMPLANT
TUBE SUCTION HIGH CAP CLEAR NV (SUCTIONS) ×2 IMPLANT
WATER STERILE IRR 1000ML POUR (IV SOLUTION) ×4 IMPLANT
WRAP KNEE MAXI GEL POST OP (GAUZE/BANDAGES/DRESSINGS) ×2 IMPLANT

## 2023-08-07 NOTE — Progress Notes (Signed)
Orthopedic Tech Progress Note Patient Details:  Leon Mitchell 1955-10-24 [id number]  Patient ID: Leon Mitchell, male   DOB: October 13, 1955, 68 y.o.   MRN: [id number] CPM removed by PT. [name] 08/07/2023, 4:47 PM

## 2023-08-07 NOTE — Discharge Instructions (Addendum)
 Frank Aluisio, MD Total Joint Specialist [facility] [address] [place], [place] [address] [phone]  TOTAL KNEE REPLACEMENT POSTOPERATIVE DIRECTIONS    Knee Rehabilitation, Guidelines Following Surgery  Results after knee surgery are often greatly improved when you follow the exercise, range of motion and muscle strengthening exercises prescribed by your doctor. Safety measures are also important to protect the knee from further injury. If any of these exercises cause you to have increased pain or swelling in your knee joint, decrease the amount until you are comfortable again and slowly increase them. If you have problems or questions, call your caregiver or physical therapist for advice.   HOME CARE INSTRUCTIONS  Remove items at home which could result in a fall. This includes throw rugs or furniture in walking pathways.  ICE to the affected knee as much as tolerated. Icing helps control swelling. If the swelling is well controlled you will be more comfortable and rehab easier. Continue to use ice on the knee for pain and swelling from surgery. You may notice swelling that will progress down to the foot and ankle. This is normal after surgery. Elevate the leg when you are not up walking on it.    Continue to use the breathing machine which will help keep your temperature down. It is common for your temperature to cycle up and down following surgery, especially at night when you are not up moving around and exerting yourself. The breathing machine keeps your lungs expanded and your temperature down. Do not place pillow under the operative knee, focus on keeping the knee straight while resting  DIET You may resume your previous home diet once you are discharged from the hospital.  DRESSING / WOUND CARE / SHOWERING Keep your bulky bandage on for 2 days. On the third post-operative day you may remove the Ace bandage and gauze. There is a waterproof  adhesive bandage on your skin which will stay in place until your first follow-up appointment. Once you remove this you will not need to place another bandage You may begin showering 3 days following surgery, but do not submerge the incision under water.  ACTIVITY For the first 5 days, the key is rest and control of pain and swelling Do your home exercises twice a day starting on post-operative day 3. On the days you go to physical therapy, just do the home exercises once that day. You should rest, ice and elevate the leg for 50 minutes out of every hour. Get up and walk/stretch for 10 minutes per hour. After 5 days you can increase your activity slowly as tolerated. Walk with your walker as instructed. Use the walker until you are comfortable transitioning to a cane. Walk with the cane in the opposite hand of the operative leg. You may discontinue the cane once you are comfortable and walking steadily. Avoid periods of inactivity such as sitting longer than an hour when not asleep. This helps prevent blood clots.  You may discontinue the knee immobilizer once you are able to perform a straight leg raise while lying down. You may resume a sexual relationship in one month or when given the OK by your doctor.  You may return to work once you are cleared by your doctor.  Do not drive a car for 6 weeks or until released by your surgeon.  Do not drive while taking narcotics.  TED HOSE STOCKINGS Wear the elastic stockings on both legs for three weeks following surgery during the   day. You may remove them at night for sleeping.  WEIGHT BEARING Weight bearing as tolerated with assist device (walker, cane, etc) as directed, use it as long as suggested by your surgeon or therapist, typically at least 4-6 weeks.  POSTOPERATIVE CONSTIPATION PROTOCOL Constipation - defined medically as fewer than three stools per week and severe constipation as less than one stool per week.  One of the most common issues  patients have following surgery is constipation.  Even if you have a regular bowel pattern at home, your normal regimen is likely to be disrupted due to multiple reasons following surgery.  Combination of anesthesia, postoperative narcotics, change in appetite and fluid intake all can affect your bowels.  In order to avoid complications following surgery, here are some recommendations in order to help you during your recovery period.  Colace (docusate) - Pick up an over-the-counter form of Colace or another stool softener and take twice a day as long as you are requiring postoperative pain medications.  Take with a full glass of water daily.  If you experience loose stools or diarrhea, hold the colace until you stool forms back up. If your symptoms do not get better within 1 week or if they get worse, check with your doctor. Dulcolax (bisacodyl) - Pick up over-the-counter and take as directed by the product packaging as needed to assist with the movement of your bowels.  Take with a full glass of water.  Use this product as needed if not relieved by Colace only.  MiraLax (polyethylene glycol) - Pick up over-the-counter to have on hand. MiraLax is a solution that will increase the amount of water in your bowels to assist with bowel movements.  Take as directed and can mix with a glass of water, juice, soda, coffee, or tea. Take if you go more than two days without a movement. Do not use MiraLax more than once per day. Call your doctor if you are still constipated or irregular after using this medication for 7 days in a row.  If you continue to have problems with postoperative constipation, please contact the office for further assistance and recommendations.  If you experience "the worst abdominal pain ever" or develop nausea or vomiting, please contact the office immediatly for further recommendations for treatment.  ITCHING If you experience itching with your medications, try taking only a single pain  pill, or even half a pain pill at a time.  You can also use Benadryl over the counter for itching or also to help with sleep.   MEDICATIONS See your medication summary on the "After Visit Summary" that the nursing staff will review with you prior to discharge.  You may have some home medications which will be placed on hold until you complete the course of blood thinner medication.  It is important for you to complete the blood thinner medication as prescribed by your surgeon.  Continue your approved medications as instructed at time of discharge.  PRECAUTIONS If you experience chest pain or shortness of breath - call 911 immediately for transfer to the hospital emergency department.  If you develop a fever greater that 101 F, purulent drainage from wound, increased redness or drainage from wound, foul odor from the wound/dressing, or calf pain - CONTACT YOUR SURGEON.                                                     FOLLOW-UP APPOINTMENTS Make sure you keep all of your appointments after your operation with your surgeon and caregivers. You should call the office at the above phone number and make an appointment for approximately two weeks after the date of your surgery or on the date instructed by your surgeon outlined in the "After Visit Summary".  RANGE OF MOTION AND STRENGTHENING EXERCISES  Rehabilitation of the knee is important following a knee injury or an operation. After just a few days of immobilization, the muscles of the thigh which control the knee become weakened and shrink (atrophy). Knee exercises are designed to build up the tone and strength of the thigh muscles and to improve knee motion. Often times heat used for twenty to thirty minutes before working out will loosen up your tissues and help with improving the range of motion but do not use heat for the first two weeks following surgery. These exercises can be done on a training (exercise) mat, on the floor, on a table or on a bed.  Use what ever works the best and is most comfortable for you Knee exercises include:  Leg Lifts - While your knee is still immobilized in a splint or cast, you can do straight leg raises. Lift the leg to 60 degrees, hold for 3 sec, and slowly lower the leg. Repeat 10-20 times 2-3 times daily. Perform this exercise against resistance later as your knee gets better.  Quad and Hamstring Sets - Tighten up the muscle on the front of the thigh (Quad) and hold for 5-10 sec. Repeat this 10-20 times hourly. Hamstring sets are done by pushing the foot backward against an object and holding for 5-10 sec. Repeat as with quad sets.  Leg Slides: Lying on your back, slowly slide your foot toward your buttocks, bending your knee up off the floor (only go as far as is comfortable). Then slowly slide your foot back down until your leg is flat on the floor again. Angel Wings: Lying on your back spread your legs to the side as far apart as you can without causing discomfort.  A rehabilitation program following serious knee injuries can speed recovery and prevent re-injury in the future due to weakened muscles. Contact your doctor or a physical therapist for more information on knee rehabilitation.   POST-OPERATIVE OPIOID TAPER INSTRUCTIONS: It is important to wean off of your opioid medication as soon as possible. If you do not need pain medication after your surgery it is ok to stop day one. Opioids include: Codeine, Hydrocodone(Norco, Vicodin), Oxycodone(Percocet, oxycontin) and hydromorphone amongst others.  Long term and even short term use of opiods can cause: Increased pain response Dependence Constipation Depression Respiratory depression And more.  Withdrawal symptoms can include Flu like symptoms Nausea, vomiting And more Techniques to manage these symptoms Hydrate well Eat regular healthy meals Stay active Use relaxation techniques(deep breathing, meditating, yoga) Do Not substitute Alcohol to help  with tapering If you have been on opioids for less than two weeks and do not have pain than it is ok to stop all together.  Plan to wean off of opioids This plan should start within one week post op of your joint replacement. Maintain the same interval or time between taking each dose and first decrease the dose.  Cut the total daily intake of opioids by one tablet each day Next start to increase the time between doses. The last dose that should be eliminated is the evening dose.   IF YOU ARE TRANSFERRED TO   A SKILLED REHAB FACILITY If the patient is transferred to a skilled rehab facility following release from the hospital, a list of the current medications will be sent to the facility for the patient to continue.  When discharged from the skilled rehab facility, please have the facility set up the patient's Home Health Physical Therapy prior to being released. Also, the skilled facility will be responsible for providing the patient with their medications at time of release from the facility to include their pain medication, the muscle relaxants, and their blood thinner medication. If the patient is still at the rehab facility at time of the two week follow up appointment, the skilled rehab facility will also need to assist the patient in arranging follow up appointment in our office and any transportation needs.  MAKE SURE YOU:  Understand these instructions.  Get help right away if you are not doing well or get worse.   DENTAL ANTIBIOTICS:  In most cases prophylactic antibiotics for Dental procdeures after total joint surgery are not necessary.  Exceptions are as follows:  1. History of prior total joint infection  2. Severely immunocompromised (Organ Transplant, cancer chemotherapy, Rheumatoid biologic medications such as Humera)  3. Poorly controlled diabetes (A1C &gt; 8.0, blood glucose over 200)  If you have one of these conditions, contact your surgeon for an antibiotic  prescription, prior to your dental procedure.    Pick up stool softner and laxative for home use following surgery while on pain medications. Do not submerge incision under water. Please use good hand washing techniques while changing dressing each day. May shower starting three days after surgery. Please use a clean towel to pat the incision dry following showers. Continue to use ice for pain and swelling after surgery. Do not use any lotions or creams on the incision until instructed by your surgeon.  

## 2023-08-07 NOTE — Progress Notes (Signed)
Orthopedic Tech Progress Note Patient Details:  Leon Mitchell [birth year] [id number]  CPM Left Knee CPM Left Knee: On Left Knee Flexion (Degrees): 40 Left Knee Extension (Degrees): 10  Post Interventions Patient Tolerated: Poor  [name] 08/07/2023, 12:45 PM

## 2023-08-07 NOTE — Care Plan (Signed)
Ortho Bundle Case Management Note  Patient Details  Name: Leon Mitchell MRN: [id number] Date of Birth: 1956-05-06                  L TKA on 08-07-23  DCP: Home with wife  DME: No needs, has a RW  PT: [facility] on 08-10-23.   DME Arranged:  N/A DME Agency:       Additional Comments: Please contact me with any questions of if this plan should need to change.   Ennis Forts, RN,CCM [facility]  [phone] 08/07/2023, 1:30 PM

## 2023-08-07 NOTE — Interval H&P Note (Signed)
History and Physical Interval Note:  08/07/2023 8:16 AM  Leon Mitchell  has presented today for surgery, with the diagnosis of left knee osteoarthritis.  The various methods of treatment have been discussed with the patient and family. After consideration of risks, benefits and other options for treatment, the patient has consented to  Procedure(s): TOTAL KNEE ARTHROPLASTY (Left) as a surgical intervention.  The patient's history has been reviewed, patient examined, no change in status, stable for surgery.  I have reviewed the patient's chart and labs.  Questions were answered to the patient's satisfaction.     Homero Fellers Cougar Imel

## 2023-08-07 NOTE — Anesthesia Procedure Notes (Signed)
Anesthesia Regional Block: Adductor canal block   Pre-Anesthetic Checklist: , timeout performed,  Correct Patient, Correct Site, Correct Laterality,  Correct Procedure, Correct Position, site marked,  Risks and benefits discussed,  Surgical consent,  Pre-op evaluation,  At surgeon's request and post-op pain management  Laterality: Left  Prep: chloraprep       Needles:  Injection technique: Single-shot  Needle Type: Echogenic Stimulator Needle     Needle Length: 9cm  Needle Gauge: 21     Additional Needles:   Procedures:,,,, ultrasound used (permanent image in chart),,    Narrative:  Start time: 08/07/2023 9:45 AM End time: 08/07/2023 9:50 AM Injection made incrementally with aspirations every 5 mL.  Performed by: Personally  Anesthesiologist: [name], MD  Additional Notes: Discussed risks and benefits of the nerve block in detail, including but not limited vascular injury, permanent nerve damage and infection.   Patient tolerated the procedure well. Local anesthetic introduced in an incremental fashion under minimal resistance after negative aspirations. No paresthesias were elicited. After completion of the procedure, no acute issues were identified and patient continued to be monitored by RN.

## 2023-08-07 NOTE — Transfer of Care (Signed)
Immediate Anesthesia Transfer of Care Note  Patient: [name]  Procedure(s) Performed: TOTAL KNEE ARTHROPLASTY (Left: Knee)  Patient Location: PACU  Anesthesia Type:General and Regional  Level of Consciousness: drowsy  Airway & Oxygen Therapy: Patient Spontanous Breathing and Patient connected to nasal cannula oxygen  Post-op Assessment: Report given to RN and Post -op Vital signs reviewed and stable  Post vital signs: Reviewed and stable  Last Vitals:  Vitals Value Taken Time  BP 133/81 08/07/23 1205  Temp    Pulse 57 08/07/23 1209  Resp 15 08/07/23 1209  SpO2 97 % 08/07/23 1209  Vitals shown include unfiled device data.  Last Pain:  Vitals:   08/07/23 0845  TempSrc: Oral  PainSc:          Complications: No notable events documented.

## 2023-08-07 NOTE — Anesthesia Postprocedure Evaluation (Signed)
Anesthesia Post Note  Patient: Leon Mitchell  Procedure(s) Performed: TOTAL KNEE ARTHROPLASTY (Left: Knee)     Patient location during evaluation: PACU Anesthesia Type: General Level of consciousness: awake and alert Pain management: pain level controlled Vital Signs Assessment: post-procedure vital signs reviewed and stable Respiratory status: spontaneous breathing, nonlabored ventilation, respiratory function stable and patient connected to nasal cannula oxygen Cardiovascular status: blood pressure returned to baseline and stable Postop Assessment: no apparent nausea or vomiting Anesthetic complications: no  No notable events documented.  Last Vitals:  Vitals:   08/07/23 1358 08/07/23 1750  BP: (!) 140/84 115/77  Pulse: (!) 55 65  Resp: 16 18  Temp: 36.5 C 36.6 C  SpO2: 100% 95%    Last Pain:  Vitals:   08/07/23 1750  TempSrc: Oral  PainSc:                  [name]

## 2023-08-07 NOTE — Evaluation (Signed)
Physical Therapy Evaluation Patient Details Name: Leon Mitchell MRN: [id number] DOB: 05-19-1956 Today's Date: 08/07/2023  History of Present Illness  67 yo male s/p L TKA on 08/07/23. 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  Pt motivated to be OOB/work with PT;  Amb ~ 7' with RW and min assist. HEP initiated. Anticipate steady progress.   Pt will benefit from acute skilled PT to increase their independence and safety with mobility to allow discharge.          If plan is discharge home, recommend the following: A little help with bathing/dressing/bathroom;Assist for transportation;Help with stairs or ramp for entrance;Assistance with cooking/housework   Can travel by private vehicle        Equipment Recommendations None recommended by PT  Recommendations for Other Services       Functional Status Assessment Patient has had a recent decline in their functional status and demonstrates the ability to make significant improvements in function in a reasonable and predictable amount of time.     Precautions / Restrictions Precautions Precautions: Fall;Knee Precaution Comments: able to perform SLRs I'ly <10 degree quad lag Required Braces or Orthoses: Knee Immobilizer - Right Knee Immobilizer - Right: Discontinue once straight leg raise with < 10 degree lag Restrictions Weight Bearing Restrictions: No Other Position/Activity Restrictions: WBAT      Mobility  Bed Mobility Overal bed mobility: Needs Assistance Bed Mobility: Supine to Sit     Supine to sit: Contact guard     General bed mobility comments: for safety    Transfers Overall transfer level: Needs assistance Equipment used: Rolling walker (2 wheels) Transfers: Sit to/from Stand Sit to Stand: Contact guard assist, Min assist, From elevated surface            General transfer comment: cues for hand and LLE position    Ambulation/Gait Ambulation/Gait assistance: Contact guard assist, Min assist Gait Distance (Feet): 50 Feet Assistive device: Rolling walker (2 wheels) Gait Pattern/deviations: Step-to pattern       General Gait Details: cues for sequence and use of UEs to offload LLE as needed  Stairs            Wheelchair Mobility     Tilt Bed    Modified Rankin (Stroke Patients Only)       Balance Overall balance assessment: History of Falls (pt reports he is "wobbly" at baseline)                                           Pertinent Vitals/Pain Pain Assessment Pain Assessment: 0-10 Pain Score: 4  Pain Location: Left knee Pain Descriptors / Indicators: Aching, Grimacing, Sore Pain Intervention(s): Limited activity within patient's tolerance, Monitored during session, Premedicated before session, Repositioned    Home Living Family/patient expects to be discharged to:: Private residence Living Arrangements: Spouse/significant other Available Help at Discharge: Family;Friend(s);Available 24 hours/day Type of Home: House Home Access: Stairs to enter Entrance Stairs-Rails: Left;Can reach both Entrance Stairs-Number of Steps: 6   Home Layout: Two level;Able to live on main level with bedroom/bathroom Home Equipment: Rolling Walker (2 wheels);Rollator (4 wheels);Cane - single point;Shower seat;Adaptive equipment;BSC/3in1      Prior Function Prior Level of Function : Independent/Modified Independent;Driving  Mobility Comments: Could ambulate in community; Has been using rollator ro cane; reports using rollator when doing yard work       Extremity/Trunk Assessment   Upper Extremity Assessment Upper Extremity Assessment: Overall WFL for tasks assessed    Lower Extremity Assessment Lower Extremity Assessment: RLE deficits/detail RLE Deficits / Details: grosly WFL, pt reports  "numbness d/t old injury" LLE Deficits / Details: ankle WFL, knee extension and hip flexion 3/5       Communication   Communication Communication: No apparent difficulties  Cognition Arousal: Alert Behavior During Therapy: WFL for tasks assessed/performed Overall Cognitive Status: Within Functional Limits for tasks assessed                                          General Comments      Exercises Total Joint Exercises Ankle Circles/Pumps: AROM, Both, 5 reps Quad Sets: 5 reps, Both, AROM   Assessment/Plan    PT Assessment Patient needs continued PT services  PT Problem List Decreased strength;Decreased range of motion;Decreased activity tolerance;Decreased balance;Decreased mobility;Decreased knowledge of precautions;Decreased knowledge of use of DME;Pain       PT Treatment Interventions DME instruction;Gait training;Functional mobility training;Therapeutic activities;Therapeutic exercise;Patient/family education    PT Goals (Current goals can be found in the Care Plan section)  Acute Rehab PT Goals PT Goal Formulation: With patient Time For Goal Achievement: 08/14/23 Potential to Achieve Goals: Good    Frequency Min 1X/week     Co-evaluation               AM-PAC PT "6 Clicks" Mobility  Outcome Measure Help needed turning from your back to your side while in a flat bed without using bedrails?: A Little Help needed moving from lying on your back to sitting on the side of a flat bed without using bedrails?: A Little Help needed moving to and from a bed to a chair (including a wheelchair)?: A Little Help needed standing up from a chair using your arms (e.g., wheelchair or bedside chair)?: A Little Help needed to walk in hospital room?: A Little Help needed climbing 3-5 steps with a railing? : A Little 6 Click Score: 18    End of Session Equipment Utilized During Treatment: Gait belt Activity Tolerance: Patient tolerated treatment well Patient  left: in chair;with call bell/phone within reach;with chair alarm set Nurse Communication: Mobility status PT Visit Diagnosis: Other abnormalities of gait and mobility (R26.89);Difficulty in walking, not elsewhere classified (R26.2)    Time: 8295-6213 PT Time Calculation (min) (ACUTE ONLY): 22 min   Charges:   PT Evaluation $PT Eval Low Complexity: 1 Low   PT General Charges $$ ACUTE PT VISIT: 1 Visit         Minka Knight, PT  Acute Rehab Dept [facility]) [phone]  08/07/2023   [facility] 08/07/2023, 4:45 PM

## 2023-08-07 NOTE — Telephone Encounter (Signed)
PA approved see other encounter

## 2023-08-07 NOTE — Op Note (Signed)
OPERATIVE REPORT-TOTAL KNEE ARTHROPLASTY   Pre-operative diagnosis- Osteoarthritis  Left knee(s)  Post-operative diagnosis- Osteoarthritis Left knee(s)  Procedure-  Left  Total Knee Arthroplasty  Surgeon- Gus Rankin. Yosiel Thieme, MD  Assistant- Weston Brass, PA-C   Anesthesia-  GA combined with regional for post-op pain  EBL- 25 ml   Drains None  Tourniquet time-  Total Tourniquet Time Documented: Thigh (Left) - 48 minutes Total: Thigh (Left) - 48 minutes     Complications- None  Condition-PACU - hemodynamically stable.   Brief Clinical Note   Leon Mitchell is a 67 y.o. year old male with end stage OA of his left knee with progressively worsening pain and dysfunction. He has constant pain, with activity and at rest and significant functional deficits with difficulties even with ADLs. He has had extensive non-op management including analgesics, injections of cortisone and viscosupplements, and home exercise program, but remains in significant pain with significant dysfunction. Radiographs show bone on bone arthritis medial and patellofemoral. He presents now for left Total Knee Arthroplasty.     Procedure in detail---   The patient is brought into the operating room and positioned supine on the operating table. After successful administration of  GA combined with regional for post-op pain,   a tourniquet is placed high on the  Right thigh(s) and the lower extremity is prepped and draped in the usual sterile fashion. Time out is performed by the operating team and then the  Right lower extremity is wrapped in Esmarch, knee flexed and the tourniquet inflated to 300 mmHg.       A midline incision is made with a ten blade through the subcutaneous tissue to the level of the extensor mechanism. A fresh blade is used to make a medial parapatellar arthrotomy. Soft tissue over the proximal medial tibia is subperiosteally elevated to the joint line with a knife and into the semimembranosus  bursa with a Cobb elevator. Soft tissue over the proximal lateral tibia is elevated with attention being paid to avoiding the patellar tendon on the tibial tubercle. The patella is everted, knee flexed 90 degrees and the ACL and PCL are removed. Findings are bone on bone medial and patellofemoral with large global osteophytes.        The drill is used to create a starting hole in the distal femur and the canal is thoroughly irrigated with sterile saline to remove the fatty contents. The 5 degree Left  valgus alignment guide is placed into the femoral canal and the distal femoral cutting block is pinned to remove 10 mm off the distal femur. Resection is made with an oscillating saw.      The tibia is subluxed forward and the menisci are removed. The extramedullary alignment guide is placed referencing proximally at the medial aspect of the tibial tubercle and distally along the second metatarsal axis and tibial crest. The block is pinned to remove 2mm off the more deficient medial  side. Resection is made with an oscillating saw. Size 7is the most appropriate size for the tibia and the proximal tibia is prepared with the modular drill and keel punch for that size.      The femoral sizing guide is placed and size 8 is most appropriate. Rotation is marked off the epicondylar axis and confirmed by creating a rectangular flexion gap at 90 degrees. The size 8 cutting block is pinned in this rotation and the anterior, posterior and chamfer cuts are made with the oscillating saw. The intercondylar block is then  placed and that cut is made.      Trial size 7 tibial component, trial size 8 posterior stabilized femur and a 7  mm posterior stabilized rotating platform insert trial is placed. Full extension is achieved with excellent varus/valgus and anterior/posterior balance throughout full range of motion. The patella is everted and thickness measured to be 27  mm. Free hand resection is taken to 15 mm, a 41 template is  placed, lug holes are drilled, trial patella is placed, and it tracks normally. Osteophytes are removed off the posterior femur with the trial in place. All trials are removed and the cut bone surfaces prepared with pulsatile lavage. Cement is mixed and once ready for implantation, the size 7 tibial implant, size  8 posterior stabilized femoral component, and the size 41 patella are cemented in place and the patella is held with the clamp. The trial insert is placed and the knee held in full extension. The Exparel (20 ml mixed with 60 ml saline) is injected into the extensor mechanism, posterior capsule, medial and lateral gutters and subcutaneous tissues.  All extruded cement is removed and once the cement is hard the permanent 7 mm posterior stabilized rotating platform insert is placed into the tibial tray.      The wound is copiously irrigated with saline solution and the extensor mechanism closed with # 0 Stratofix suture. The tourniquet is released for a total tourniquet time of 48  minutes. Flexion against gravity is 140 degrees and the patella tracks normally. Subcutaneous tissue is closed with 2.0 vicryl and subcuticular with running 4.0 Monocryl. The incision is cleaned and dried and steri-strips and a bulky sterile dressing are applied. The limb is placed into a knee immobilizer and the patient is awakened and transported to recovery in stable condition.      Please note that a surgical assistant was a medical necessity for this procedure in order to perform it in a safe and expeditious manner. Surgical assistant was necessary to retract the ligaments and vital neurovascular structures to prevent injury to them and also necessary for proper positioning of the limb to allow for anatomic placement of the prosthesis.   Gus Rankin Yoceline Bazar, MD    08/07/2023, 11:39 AM

## 2023-08-08 ENCOUNTER — Encounter (HOSPITAL_COMMUNITY): Payer: Self-pay | Admitting: Orthopedic Surgery

## 2023-08-08 DIAGNOSIS — J449 Chronic obstructive pulmonary disease, unspecified: Secondary | ICD-10-CM | POA: Diagnosis not present

## 2023-08-08 DIAGNOSIS — Z7982 Long term (current) use of aspirin: Secondary | ICD-10-CM | POA: Diagnosis not present

## 2023-08-08 DIAGNOSIS — M1712 Unilateral primary osteoarthritis, left knee: Secondary | ICD-10-CM | POA: Diagnosis not present

## 2023-08-08 DIAGNOSIS — Z96641 Presence of right artificial hip joint: Secondary | ICD-10-CM | POA: Diagnosis not present

## 2023-08-08 DIAGNOSIS — I2510 Atherosclerotic heart disease of native coronary artery without angina pectoris: Secondary | ICD-10-CM | POA: Diagnosis not present

## 2023-08-08 DIAGNOSIS — I10 Essential (primary) hypertension: Secondary | ICD-10-CM | POA: Diagnosis not present

## 2023-08-08 LAB — BASIC METABOLIC PANEL
Anion gap: 5 (ref 5–15)
BUN: 31 mg/dL — ABNORMAL HIGH (ref 8–23)
CO2: 26 mmol/L (ref 22–32)
Calcium: 8.4 mg/dL — ABNORMAL LOW (ref 8.9–10.3)
Chloride: 105 mmol/L (ref 98–111)
Creatinine, Ser: 0.92 mg/dL (ref 0.61–1.24)
GFR, Estimated: >60 mL/min (ref >60)
Glucose, Bld: 119 mg/dL — ABNORMAL HIGH (ref 70–99)
Potassium: 4.7 mmol/L (ref 3.5–5.1)
Sodium: 136 mmol/L (ref 135–145)

## 2023-08-08 LAB — CBC
HCT: 38.8 % — ABNORMAL LOW (ref 39.0–52.0)
Hemoglobin: 12.7 g/dL — ABNORMAL LOW (ref 13.0–17.0)
MCH: 31.9 pg (ref 26.0–34.0)
MCHC: 32.7 g/dL (ref 30.0–36.0)
MCV: 97.5 fL (ref 80.0–100.0)
Platelets: 231 10*3/uL (ref 150–400)
RBC: 3.98 MIL/uL — ABNORMAL LOW (ref 4.22–5.81)
RDW: 13.8 % (ref 11.5–15.5)
WBC: 12.1 10*3/uL — ABNORMAL HIGH (ref 4.0–10.5)
nRBC: 0 % (ref 0.0–0.2)

## 2023-08-08 MED ORDER — OXYCODONE HCL 5 MG PO TABS: 5.0000 mg | ORAL_TABLET | Freq: Four times a day (QID) | ORAL | 0 refills | Status: AC | PRN

## 2023-08-08 MED ORDER — METHOCARBAMOL 500 MG PO TABS: 500.0000 mg | ORAL_TABLET | Freq: Four times a day (QID) | ORAL | 0 refills | Status: DC | PRN

## 2023-08-08 MED ORDER — ONDANSETRON HCL 4 MG PO TABS: 4.0000 mg | ORAL_TABLET | Freq: Four times a day (QID) | ORAL | 0 refills | Status: DC | PRN

## 2023-08-08 MED ORDER — TRAMADOL HCL 50 MG PO TABS: 50.0000 mg | ORAL_TABLET | Freq: Four times a day (QID) | ORAL | 0 refills | Status: DC | PRN

## 2023-08-08 MED ORDER — ASPIRIN 81 MG PO CHEW: 81.0000 mg | CHEWABLE_TABLET | Freq: Two times a day (BID) | ORAL | 0 refills | Status: AC

## 2023-08-08 NOTE — Progress Notes (Signed)
Reviewed written D/C orders with pt and all questions answered. Pt verbalized understanding. Pt left in stable condition with all belongings.

## 2023-08-08 NOTE — TOC Transition Note (Signed)
Transition of Care [facility]) - CM/SW Discharge Note  Patient Details  Name: Leon Mitchell MRN: [id number] Date of Birth: 1956/07/11  Transition of Care [facility]) CM/SW Contact:  Ewing Schlein, LCSW Phone Number: 08/08/2023, 11:07 AM  Clinical Narrative: Patient is expected to discharge home after working with PT. CSW met with patient to confirm discharge plan. Patient will go home with OPPT at [facility]. Patient has a rolling walker at home, so there are no DME needs at this time. TOC signing off.    Final next level of care: OP Rehab Barriers to Discharge: No Barriers Identified  Patient Goals and CMS Choice Choice offered to / list presented to : NA  Discharge Plan and Services Additional resources added to the After Visit Summary for         DME Arranged: N/A DME Agency: NA  Social Determinants of Health (SDOH) Interventions SDOH Screenings   Food Insecurity: No Food Insecurity (08/07/2023)  Housing: Low Risk  (08/07/2023)  Transportation Needs: No Transportation Needs (08/07/2023)  Utilities: Not At Risk (08/07/2023)  Depression (PHQ2-9): Medium Risk (11/15/2022)  Tobacco Use: High Risk (08/07/2023)   Readmission Risk Interventions     No data to display

## 2023-08-08 NOTE — Progress Notes (Signed)
   Subjective: 1 Day Post-Op Procedure(s) (LRB): TOTAL KNEE ARTHROPLASTY (Left) Patient seen in rounds by Dr. Lequita Halt. Patient is well, and has had no acute complaints or problems. Denies SOB or chest pain. Denies calf pain. Patient reports pain as moderate. Worked with physical therapy yesterday and ambulated 50'. We will continue physical therapy today.  Objective: Vital signs in last 24 hours: Temp:  [96.4 F (35.8 C)-98.4 F (36.9 C)] 97.5 F (36.4 C) (10/29 0456) Pulse Rate:  [50-65] 59 (10/29 0456) Resp:  [9-30] 16 (10/29 0456) BP: (111-157)/(59-92) 157/91 (10/29 0456) SpO2:  [95 %-100 %] 99 % (10/29 0456) Weight:  [102.1 kg] 102.1 kg (10/28 0817)  Intake/Output from previous day:  Intake/Output Summary (Last 24 hours) at 08/08/2023 0756 Last data filed at 08/08/2023 0500 Gross per 24 hour  Intake 2300 ml  Output 1075 ml  Net 1225 ml     Intake/Output this shift: No intake/output data recorded.  Labs: Recent Labs    08/08/23 0342  HGB 12.7*   Recent Labs    08/08/23 0342  WBC 12.1*  RBC 3.98*  HCT 38.8*  PLT 231   Recent Labs    08/08/23 0342  NA 136  K 4.7  CL 105  CO2 26  BUN 31*  CREATININE 0.92  GLUCOSE 119*  CALCIUM 8.4*   No results for input(s): "LABPT", "INR" in the last 72 hours.  Exam: General - Patient is Alert and Oriented Extremity - Neurologically intact Neurovascular intact Sensation intact distally Dorsiflexion/Plantar flexion intact Dressing - dressing C/D/I Motor Function - intact, moving foot and toes well on exam.  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   Weight as of this encounter: 102.1 kg. Advance diet Up with therapy D/C IV fluids  Patient's anticipated LOS is less than 2 midnights, meeting these requirements: - Lives within 1 hour of care - Has a competent adult at home to recover with post-op - NO history of  - Chronic pain requiring opiods  - Diabetes  - Heart failure  - Heart attack  - Stroke  - DVT/VTE  - Cardiac arrhythmia  - Respiratory Failure/COPD  - Renal failure  - Anemia  - Advanced Liver disease  DVT Prophylaxis -  Prasugrel + Aspirin Weight bearing as tolerated.  Continue physical therapy. Expected discharge home today pending progress and if meeting patient goals. Scheduled for OPPT at [facility]. Follow-up in clinic in 2 weeks.  The PDMP database was reviewed today prior to any opioid medications being prescribed to this patient.  R. Arcola Jansky, PA-C Orthopedic Surgery 08/08/2023, 7:56 AM

## 2023-08-08 NOTE — Progress Notes (Signed)
Physical Therapy Treatment Patient Details Name: Leon Mitchell MRN: [id number] DOB: June 29, 1956 Today's Date: 08/08/2023   History of Present Illness 67 yo male s/p L TKA on 08/07/23. 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, CAD, COPD, HTN, OA, lumbar and thoracic surgery x4 with last in 2023    PT Comments  Pt is progressing well this session, meeting PT goals and is motivated to d/c home;    If plan is discharge home, recommend the following: A little help with bathing/dressing/bathroom;Assist for transportation;Help with stairs or ramp for entrance;Assistance with cooking/housework   Can travel by private vehicle        Equipment Recommendations  None recommended by PT    Recommendations for Other Services       Precautions / Restrictions Precautions Precautions: Fall;Knee Precaution Comments: able to perform SLRs I'ly <10 degree quad lag Restrictions Weight Bearing Restrictions: No Other Position/Activity Restrictions: WBAT     Mobility  Bed Mobility Overal bed mobility: Modified Independent                  Transfers Overall transfer level: Needs assistance Equipment used: Rolling walker (2 wheels) Transfers: Sit to/from Stand Sit to Stand: Supervision           General transfer comment: cues for hand and LLE position    Ambulation/Gait Ambulation/Gait assistance: Supervision, Contact guard assist Gait Distance (Feet): 90 Feet Assistive device: Rolling walker (2 wheels) Gait Pattern/deviations: Step-to pattern, Step-through pattern       General Gait Details: cues for sequence and use of UEs to offload LLE as needed; progression to step through   Stairs Stairs: Yes Stairs assistance: Contact guard assist, Supervision Stair Management: Two rails, Step to pattern, Forwards Number of Stairs: 3 General stair comments: cues for sequence, no knee buckling and no LOB   Wheelchair Mobility     Tilt Bed    Modified Rankin (Stroke Patients Only)        Balance                                            Cognition Arousal: Alert Behavior During Therapy: WFL for tasks assessed/performed Overall Cognitive Status: Within Functional Limits for tasks assessed                                          Exercises Total Joint Exercises Ankle Circles/Pumps: AROM, Both, 10 reps Quad Sets: 5 reps, AROM, Left Heel Slides: AAROM, Left, 10 reps Straight Leg Raises: AROM, Left, 10 reps Knee Flexion: AROM, Left, 5 reps, Seated    General Comments        Pertinent Vitals/Pain Pain Assessment Pain Assessment: 0-10 Pain Score: 4  Pain Location: Left knee Pain Descriptors / Indicators: Aching, Grimacing, Sore Pain Intervention(s): Limited activity within patient's tolerance, Monitored during session, Premedicated before session, Repositioned, Ice applied    Home Living                          Prior Function            PT Goals (current goals can now be found in the care plan section) Acute Rehab PT Goals PT Goal Formulation: With patient Time For Goal Achievement: 08/14/23 Potential to Achieve Goals:  Good Progress towards PT goals: Progressing toward goals    Frequency    7X/week      PT Plan      Co-evaluation              AM-PAC PT "6 Clicks" Mobility   Outcome Measure  Help needed turning from your back to your side while in a flat bed without using bedrails?: A Little Help needed moving from lying on your back to sitting on the side of a flat bed without using bedrails?: None Help needed moving to and from a bed to a chair (including a wheelchair)?: A Little Help needed standing up from a chair using your arms (e.g., wheelchair or bedside chair)?: A Little Help needed to walk in hospital room?: A Little Help needed climbing 3-5 steps with a railing? : A Little 6 Click Score: 19    End of Session Equipment Utilized During Treatment: Gait belt Activity Tolerance:  Patient tolerated treatment well Patient left: in chair;with call bell/phone within reach;with chair alarm set Nurse Communication: Mobility status PT Visit Diagnosis: Other abnormalities of gait and mobility (R26.89);Difficulty in walking, not elsewhere classified (R26.2)     Time: 4782-9562 PT Time Calculation (min) (ACUTE ONLY): 20 min  Charges:    $Gait Training: 8-22 mins PT General Charges $$ ACUTE PT VISIT: 1 Visit                     Chasey Dull, PT  Acute Rehab Dept ([facility]) [phone]  08/08/2023    [facility] 08/08/2023, 10:14 AM

## 2023-08-08 NOTE — Care Management Obs Status (Signed)
MEDICARE OBSERVATION STATUS NOTIFICATION   Patient Details  Name: Leon Mitchell MRN: [id number] Date of Birth: 07-15-1956   Medicare Observation Status Notification Given:  Yes    Ewing Schlein, LCSW 08/08/2023, 12:03 PM

## 2023-08-08 NOTE — Plan of Care (Signed)
  Problem: Education: Goal: Knowledge of the prescribed therapeutic regimen will improve Outcome: Progressing Goal: Understanding of discharge needs will improve Outcome: Progressing Goal: Individualized Educational Video(s) Outcome: Progressing   

## 2023-08-09 ENCOUNTER — Other Ambulatory Visit (HOSPITAL_COMMUNITY): Payer: Self-pay

## 2023-08-09 ENCOUNTER — Other Ambulatory Visit: Payer: Self-pay

## 2023-08-10 DIAGNOSIS — M6281 Muscle weakness (generalized): Secondary | ICD-10-CM | POA: Diagnosis not present

## 2023-08-10 DIAGNOSIS — R262 Difficulty in walking, not elsewhere classified: Secondary | ICD-10-CM | POA: Diagnosis not present

## 2023-08-10 DIAGNOSIS — Z96652 Presence of left artificial knee joint: Secondary | ICD-10-CM | POA: Diagnosis not present

## 2023-08-10 DIAGNOSIS — M25562 Pain in left knee: Secondary | ICD-10-CM | POA: Diagnosis not present

## 2023-08-11 NOTE — Discharge Summary (Signed)
Physician Discharge Summary   Patient ID: Leon Mitchell MRN: [id number] DOB/AGE: [birth year] 69 y.o.  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   Weight as of this encounter: 102.1 kg.  Procedure:  Procedure(s) (LRB): TOTAL KNEE ARTHROPLASTY (Left)   Consults: None  HPI: Leon Mitchell is a 67 y.o. year old male with end stage OA of his left knee with progressively worsening pain and dysfunction. He has constant pain, with activity and at rest and significant functional deficits with difficulties even with ADLs. He has had extensive non-op management including analgesics, injections of cortisone and viscosupplements, and home exercise program, but remains in significant pain with significant dysfunction. Radiographs show bone on bone arthritis medial and patellofemoral. He presents now for left Total Knee Arthroplasty.  Laboratory Data: Admission on 08/07/2023, Discharged on 08/08/2023  Component Date Value Ref Range Status   WBC 08/08/2023 12.1 (H)  4.0 - 10.5 K/uL Final   RBC 08/08/2023 3.98 (L)  4.22 - 5.81 MIL/uL Final   Hemoglobin 08/08/2023 12.7 (L)  13.0 - 17.0 g/dL Final   HCT 95/28/4132 38.8 (L)  39.0 - 52.0 % Final   MCV 08/08/2023 97.5  80.0 - 100.0 fL Final   MCH 08/08/2023 31.9  26.0 - 34.0 pg Final   MCHC 08/08/2023 32.7   30.0 - 36.0 g/dL Final   RDW 44/10/270 13.8  11.5 - 15.5 % Final   Platelets 08/08/2023 231  150 - 400 K/uL Final   nRBC 08/08/2023 0.0  0.0 - 0.2 % Final   Performed at [facility], [address], [place], [place] [address]   Sodium 08/08/2023 136  135 - 145 mmol/L Final   Potassium 08/08/2023 4.7  3.5 - 5.1 mmol/L Final   Chloride 08/08/2023 105  98 - 111 mmol/L Final   CO2 08/08/2023 26  22 - 32 mmol/L Final   Glucose, Bld 08/08/2023 119 (H)  70 - 99 mg/dL Final   Glucose reference range applies only to samples taken after fasting for at least 8 hours.   BUN 08/08/2023 31 (H)  8 - 23 mg/dL Final   Creatinine, Ser 08/08/2023 0.92  0.61 - 1.24 mg/dL Final   Calcium 40/34/7425 8.4 (L)  8.9 - 10.3 mg/dL Final   GFR, Estimated 08/08/2023 >60  >60 mL/min Final   Comment: (NOTE) Calculated using the CKD-EPI Creatinine Equation (2021)    Anion gap 08/08/2023 5  5 - 15 Final   Performed at [facility], [address] [place], [place] [address]  Hospital Outpatient Visit on 08/01/2023  Component Date Value Ref Range Status   MRSA, PCR 08/01/2023 NEGATIVE  NEGATIVE Final   Staphylococcus aureus 08/01/2023 NEGATIVE  NEGATIVE Final   Comment: (NOTE) The Xpert SA Assay (FDA  approved for NASAL specimens in patients 26 years of age and older), is one component of a comprehensive surveillance program. It is not intended to diagnose infection nor to guide or monitor treatment. Performed at [facility], [address] [place], [place] [address]    WBC 08/01/2023 6.6  4.0 - 10.5 K/uL Final   RBC 08/01/2023 4.87  4.22 - 5.81 MIL/uL Final   Hemoglobin 08/01/2023 16.0  13.0 - 17.0 g/dL Final   HCT 69/62/9528 47.2  39.0 - 52.0 % Final   MCV 08/01/2023 96.9  80.0 - 100.0 fL Final   MCH 08/01/2023 32.9  26.0 - 34.0 pg Final   MCHC 08/01/2023 33.9  30.0 - 36.0 g/dL Final   RDW 41/32/4401 14.1  11.5 - 15.5 % Final   Platelets 08/01/2023  250  150 - 400 K/uL Final   nRBC 08/01/2023 0.0  0.0 - 0.2 % Final   Performed at [facility], [address] [place], [place] [address]   Sodium 08/01/2023 139  135 - 145 mmol/L Final   Potassium 08/01/2023 4.4  3.5 - 5.1 mmol/L Final   Chloride 08/01/2023 106  98 - 111 mmol/L Final   CO2 08/01/2023 25  22 - 32 mmol/L Final   Glucose, Bld 08/01/2023 94  70 - 99 mg/dL Final   Glucose reference range applies only to samples taken after fasting for at least 8 hours.   BUN 08/01/2023 26 (H)  8 - 23 mg/dL Final   Creatinine, Ser 08/01/2023 0.82  0.61 - 1.24 mg/dL Final   Calcium 36/64/4034 9.0  8.9 - 10.3 mg/dL Final   GFR, Estimated 08/01/2023 >60  >60 mL/min Final   Comment: (NOTE) Calculated using the CKD-EPI Creatinine Equation (2021)    Anion gap 08/01/2023 8  5 - 15 Final   Performed at [facility], [address], [place], [place] [address]  Office Visit on 08/01/2023  Component Date Value Ref Range Status   Cholesterol, Total 08/02/2023 145  100 - 199 mg/dL Final   Triglycerides 56/38/7564 122  0 - 149 mg/dL Final   HDL 33/29/5188 34 (L)  >39 mg/dL Final   VLDL Cholesterol Cal 08/02/2023 22  5 - 40 mg/dL Final   LDL Chol Calc (NIH) 08/02/2023 89  0 - 99 mg/dL Final   Chol/HDL Ratio 08/02/2023 4.3  0.0 - 5.0 ratio Final   Comment:                                   T. Chol/HDL Ratio                                             Men  Women                               1/2 Avg.Risk  3.4    3.3                                   Avg.Risk  5.0    4.4  2X Avg.Risk  9.6    7.1                                3X Avg.Risk 23.4   11.0      X-Rays:No results found.  EKG: Orders placed or performed in visit on 10/27/22   EKG 12-Lead     Hospital Course: Leon Mitchell is a 67 y.o. who was admitted to [facility]. They were brought to the operating room on 08/07/2023 and underwent Procedure(s): TOTAL KNEE  ARTHROPLASTY.  Patient tolerated the procedure well and was later transferred to the recovery room and then to the orthopaedic floor for postoperative care. They were given PO and IV analgesics for pain control following their surgery. They were given 24 hours of postoperative antibiotics of  Anti-infectives (From admission, onward)    Start     Dose/Rate Route Frequency Ordered Stop   08/07/23 1600  ceFAZolin (ANCEF) IVPB 2g/100 mL premix        2 g 200 mL/hr over 30 Minutes Intravenous Every 6 hours 08/07/23 1345 08/07/23 2149   08/07/23 0815  ceFAZolin (ANCEF) IVPB 2g/100 mL premix        2 g 200 mL/hr over 30 Minutes Intravenous On call to O.R. 08/07/23 0809 08/07/23 1023      and started on DVT prophylaxis in the form of  Prasugrel + Aspirin .   PT and OT were ordered for total joint protocol. Discharge planning consulted to help with postop disposition and equipment needs.  Patient had a fair night on the evening of surgery. They started to get up OOB with therapy on POD #0. Pt was seen during rounds and was ready to go home pending progress with therapy. He worked with therapy on POD #1 and was meeting his goals. Pt was discharged to home later that day in stable condition.  Diet: Cardiac diet Activity: WBAT Follow-up: in 2 weeks Disposition: Home Discharged Condition: stable   Discharge Instructions     Call MD / Call 911   Complete by: As directed    If you experience chest pain or shortness of breath, CALL 911 and be transported to the hospital emergency room.  If you develope a fever above 101 F, pus (white drainage) or increased drainage or redness at the wound, or calf pain, call your surgeon's office.   Change dressing   Complete by: As directed    You may remove the bulky bandage (ACE wrap and gauze) two days after surgery. You will have an adhesive waterproof bandage underneath. Leave this in place until your first follow-up appointment.   Constipation Prevention    Complete by: As directed    Drink plenty of fluids.  Prune juice may be helpful.  You may use a stool softener, such as Colace (over the counter) 100 mg twice a day.  Use MiraLax (over the counter) for constipation as needed.   Diet - low sodium heart healthy   Complete by: As directed    Do not put a pillow under the knee. Place it under the heel.   Complete by: As directed    Driving restrictions   Complete by: As directed    No driving for two weeks   Post-operative opioid taper instructions:   Complete by: As directed    POST-OPERATIVE OPIOID TAPER INSTRUCTIONS: It is important to wean off of your opioid medication as soon as  possible. If you do not need pain medication after your surgery it is ok to stop day one. Opioids include: Codeine, Hydrocodone(Norco, Vicodin), Oxycodone(Percocet, oxycontin) and hydromorphone amongst others.  Long term and even short term use of opiods can cause: Increased pain response Dependence Constipation Depression Respiratory depression And more.  Withdrawal symptoms can include Flu like symptoms Nausea, vomiting And more Techniques to manage these symptoms Hydrate well Eat regular healthy meals Stay active Use relaxation techniques(deep breathing, meditating, yoga) Do Not substitute Alcohol to help with tapering If you have been on opioids for less than two weeks and do not have pain than it is ok to stop all together.  Plan to wean off of opioids This plan should start within one week post op of your joint replacement. Maintain the same interval or time between taking each dose and first decrease the dose.  Cut the total daily intake of opioids by one tablet each day Next start to increase the time between doses. The last dose that should be eliminated is the evening dose.      TED hose   Complete by: As directed    Use stockings (TED hose) for three weeks on both leg(s).  You may remove them at night for sleeping.   Weight bearing as  tolerated   Complete by: As directed       Allergies as of 08/08/2023       Reactions   Lipitor [atorvastatin] Other (See Comments)   Severe myalgia   Rosuvastatin Other (See Comments)   Severe myalgia        Medication List     STOP taking these medications    aspirin EC 81 MG tablet Replaced by: aspirin 81 MG chewable tablet   diclofenac 75 MG EC tablet Commonly known as: VOLTAREN       TAKE these medications    aspirin 81 MG chewable tablet Chew 1 tablet (81 mg total) by mouth 2 (two) times daily for 20 days. Then resume an 81 mg aspirin once a day with Prasugrel. Replaces: aspirin EC 81 MG tablet   atorvastatin 20 MG tablet Commonly known as: LIPITOR Take 0.5 tablets (10 mg total) by mouth every other day.   gabapentin 300 MG capsule Commonly known as: NEURONTIN Take 600 mg by mouth in the morning, at noon, and at bedtime.   lisinopril 20 MG tablet Commonly known as: ZESTRIL TAKE 1 TABLET BY MOUTH DAILY   methocarbamol 500 MG tablet Commonly known as: ROBAXIN Take 1 tablet (500 mg total) by mouth every 6 (six) hours as needed for muscle spasms.   metoprolol succinate 25 MG 24 hr tablet Commonly known as: TOPROL-XL TAKE 1 TABLET BY MOUTH DAILY What changed: when to take this   Nexlizet 180-10 MG Tabs Generic drug: Bempedoic Acid-Ezetimibe Take 1 tablet by mouth daily.   nitroGLYCERIN 0.4 MG SL tablet Commonly known as: NITROSTAT Place 1 tablet (0.4 mg total) under the tongue every 5 (five) minutes as needed for up to 25 days for chest pain.   ondansetron 4 MG tablet Commonly known as: ZOFRAN Take 1 tablet (4 mg total) by mouth every 6 (six) hours as needed for nausea.   oxyCODONE 5 MG immediate release tablet Commonly known as: Oxy IR/ROXICODONE Take 1-2 tablets (5-10 mg total) by mouth every 6 (six) hours as needed for severe pain (pain score 7-10).   prasugrel 10 MG Tabs tablet Commonly known as: EFFIENT Take 1 tablet (10 mg total) by  mouth  daily.   Repatha SureClick 140 MG/ML Soaj Generic drug: Evolocumab Inject 140 mg into the skin every 14 (fourteen) days.   Systane 0.4-0.3 % Soln Generic drug: Polyethyl Glycol-Propyl Glycol Place 1-2 drops into both eyes 3 (three) times daily as needed (dry/irritated eyes.).   tamsulosin 0.4 MG Caps capsule Commonly known as: FLOMAX Take 0.4 mg by mouth in the morning.   traMADol 50 MG tablet Commonly known as: ULTRAM Take 1-2 tablets (50-100 mg total) by mouth every 6 (six) hours as needed for moderate pain (pain score 4-6). What changed:  how much to take when to take this reasons to take this               Discharge Care Instructions  (From admission, onward)           Start     Ordered   08/08/23 0000  Weight bearing as tolerated        08/08/23 0803   08/08/23 0000  Change dressing       Comments: You may remove the bulky bandage (ACE wrap and gauze) two days after surgery. You will have an adhesive waterproof bandage underneath. Leave this in place until your first follow-up appointment.   08/08/23 0803            Follow-up Information     Ollen Gross, MD. Go on 08/22/2023.   Specialty: Orthopedic Surgery Why: You are scheduled for first post op appt on Tuesday November 12 at 2:15pm. Contact information: [address] [place] [address] [phone]                 Signed: R. Arcola Jansky, PA-C Orthopedic Surgery 08/11/2023, 8:31 AM

## 2023-08-14 DIAGNOSIS — M6281 Muscle weakness (generalized): Secondary | ICD-10-CM | POA: Diagnosis not present

## 2023-08-14 DIAGNOSIS — M25562 Pain in left knee: Secondary | ICD-10-CM | POA: Diagnosis not present

## 2023-08-14 DIAGNOSIS — R2243 Localized swelling, mass and lump, lower limb, bilateral: Secondary | ICD-10-CM | POA: Diagnosis not present

## 2023-08-14 DIAGNOSIS — Z96652 Presence of left artificial knee joint: Secondary | ICD-10-CM | POA: Diagnosis not present

## 2023-08-14 DIAGNOSIS — R262 Difficulty in walking, not elsewhere classified: Secondary | ICD-10-CM | POA: Diagnosis not present

## 2023-08-14 DIAGNOSIS — Z09 Encounter for follow-up examination after completed treatment for conditions other than malignant neoplasm: Secondary | ICD-10-CM | POA: Diagnosis not present

## 2023-08-16 ENCOUNTER — Other Ambulatory Visit: Payer: Self-pay | Admitting: Cardiology

## 2023-08-16 DIAGNOSIS — Z96652 Presence of left artificial knee joint: Secondary | ICD-10-CM | POA: Diagnosis not present

## 2023-08-16 DIAGNOSIS — M6281 Muscle weakness (generalized): Secondary | ICD-10-CM | POA: Diagnosis not present

## 2023-08-16 DIAGNOSIS — M25562 Pain in left knee: Secondary | ICD-10-CM | POA: Diagnosis not present

## 2023-08-16 DIAGNOSIS — R262 Difficulty in walking, not elsewhere classified: Secondary | ICD-10-CM | POA: Diagnosis not present

## 2023-08-16 NOTE — Telephone Encounter (Signed)
Refill request

## 2023-08-17 DIAGNOSIS — H0288A Meibomian gland dysfunction right eye, upper and lower eyelids: Secondary | ICD-10-CM | POA: Diagnosis not present

## 2023-08-17 DIAGNOSIS — H04123 Dry eye syndrome of bilateral lacrimal glands: Secondary | ICD-10-CM | POA: Diagnosis not present

## 2023-08-17 DIAGNOSIS — H26493 Other secondary cataract, bilateral: Secondary | ICD-10-CM | POA: Diagnosis not present

## 2023-08-17 DIAGNOSIS — D3132 Benign neoplasm of left choroid: Secondary | ICD-10-CM | POA: Diagnosis not present

## 2023-08-17 DIAGNOSIS — H0288B Meibomian gland dysfunction left eye, upper and lower eyelids: Secondary | ICD-10-CM | POA: Diagnosis not present

## 2023-08-21 DIAGNOSIS — Z96652 Presence of left artificial knee joint: Secondary | ICD-10-CM | POA: Diagnosis not present

## 2023-08-21 DIAGNOSIS — M6281 Muscle weakness (generalized): Secondary | ICD-10-CM | POA: Diagnosis not present

## 2023-08-21 DIAGNOSIS — M25562 Pain in left knee: Secondary | ICD-10-CM | POA: Diagnosis not present

## 2023-08-21 DIAGNOSIS — R262 Difficulty in walking, not elsewhere classified: Secondary | ICD-10-CM | POA: Diagnosis not present

## 2023-08-23 DIAGNOSIS — M6281 Muscle weakness (generalized): Secondary | ICD-10-CM | POA: Diagnosis not present

## 2023-08-23 DIAGNOSIS — R262 Difficulty in walking, not elsewhere classified: Secondary | ICD-10-CM | POA: Diagnosis not present

## 2023-08-23 DIAGNOSIS — Z96652 Presence of left artificial knee joint: Secondary | ICD-10-CM | POA: Diagnosis not present

## 2023-08-23 DIAGNOSIS — M25562 Pain in left knee: Secondary | ICD-10-CM | POA: Diagnosis not present

## 2023-08-28 ENCOUNTER — Ambulatory Visit (HOSPITAL_COMMUNITY)
Admission: RE | Admit: 2023-08-28 | Discharge: 2023-08-28 | Disposition: A | Discharge status: Home / Self Care | Payer: Medicare Other | Source: Ambulatory Visit | Attending: Acute Care | Admitting: Acute Care

## 2023-08-28 DIAGNOSIS — Z87891 Personal history of nicotine dependence: Secondary | ICD-10-CM | POA: Diagnosis not present

## 2023-08-28 DIAGNOSIS — F17210 Nicotine dependence, cigarettes, uncomplicated: Secondary | ICD-10-CM | POA: Diagnosis not present

## 2023-08-28 DIAGNOSIS — Z96652 Presence of left artificial knee joint: Secondary | ICD-10-CM | POA: Diagnosis not present

## 2023-08-28 DIAGNOSIS — M25562 Pain in left knee: Secondary | ICD-10-CM | POA: Diagnosis not present

## 2023-08-28 DIAGNOSIS — R911 Solitary pulmonary nodule: Secondary | ICD-10-CM | POA: Insufficient documentation

## 2023-08-28 DIAGNOSIS — M6281 Muscle weakness (generalized): Secondary | ICD-10-CM | POA: Diagnosis not present

## 2023-08-28 DIAGNOSIS — R262 Difficulty in walking, not elsewhere classified: Secondary | ICD-10-CM | POA: Diagnosis not present

## 2023-08-30 DIAGNOSIS — M6281 Muscle weakness (generalized): Secondary | ICD-10-CM | POA: Diagnosis not present

## 2023-08-30 DIAGNOSIS — Z96652 Presence of left artificial knee joint: Secondary | ICD-10-CM | POA: Diagnosis not present

## 2023-08-30 DIAGNOSIS — M25562 Pain in left knee: Secondary | ICD-10-CM | POA: Diagnosis not present

## 2023-08-30 DIAGNOSIS — R262 Difficulty in walking, not elsewhere classified: Secondary | ICD-10-CM | POA: Diagnosis not present

## 2023-09-04 DIAGNOSIS — R262 Difficulty in walking, not elsewhere classified: Secondary | ICD-10-CM | POA: Diagnosis not present

## 2023-09-04 DIAGNOSIS — M25562 Pain in left knee: Secondary | ICD-10-CM | POA: Diagnosis not present

## 2023-09-04 DIAGNOSIS — Z96652 Presence of left artificial knee joint: Secondary | ICD-10-CM | POA: Diagnosis not present

## 2023-09-04 DIAGNOSIS — M6281 Muscle weakness (generalized): Secondary | ICD-10-CM | POA: Diagnosis not present

## 2023-09-06 DIAGNOSIS — M25562 Pain in left knee: Secondary | ICD-10-CM | POA: Diagnosis not present

## 2023-09-06 DIAGNOSIS — M6281 Muscle weakness (generalized): Secondary | ICD-10-CM | POA: Diagnosis not present

## 2023-09-06 DIAGNOSIS — Z96652 Presence of left artificial knee joint: Secondary | ICD-10-CM | POA: Diagnosis not present

## 2023-09-06 DIAGNOSIS — R262 Difficulty in walking, not elsewhere classified: Secondary | ICD-10-CM | POA: Diagnosis not present

## 2023-09-10 IMAGING — CT CT CHEST W/O CM
2 of 4 series · 15 of 36 positions shown, 18 images · non-contrast
Comparison: January 20, 2022.

CLINICAL DATA: Pneumonia.



[Series 2: routine chest without · axial · non-contrast · 0.86mm/px · z∈[+1245,+1531]mm · 12 of 171 slices shown, 15 images]
[im 14/171  mediastinal]
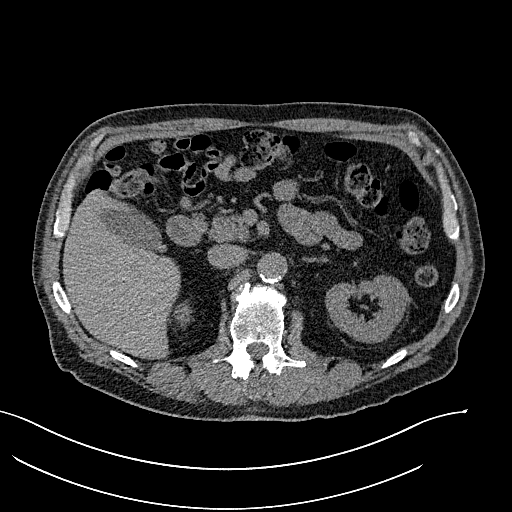
[im 14/171  lung]
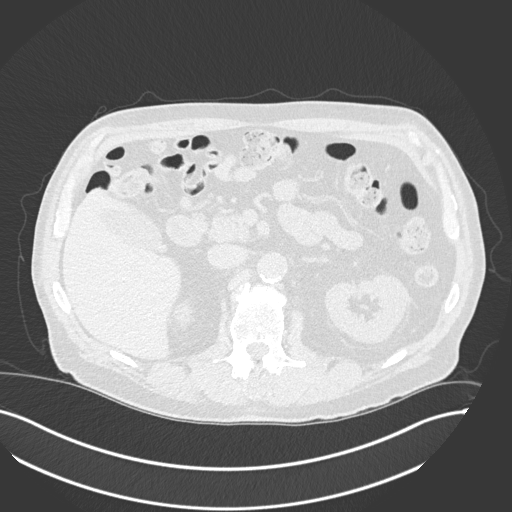
[im 27/171  lung]
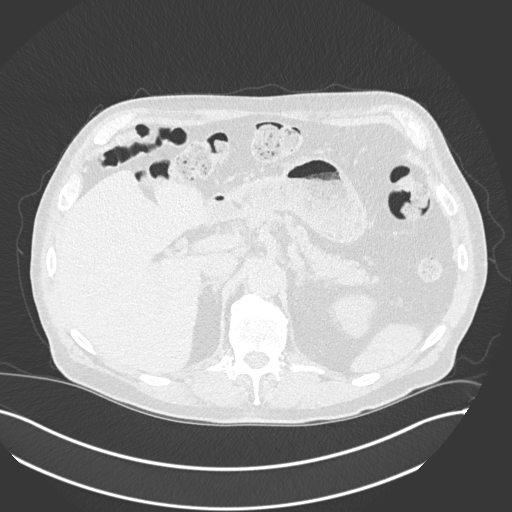
[im 40/171  lung]
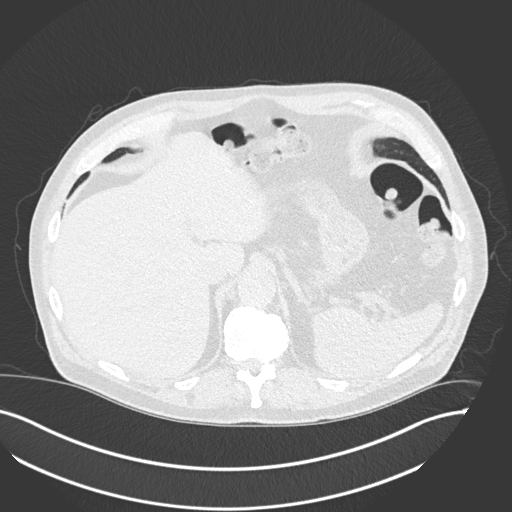
[im 53/171  lung]
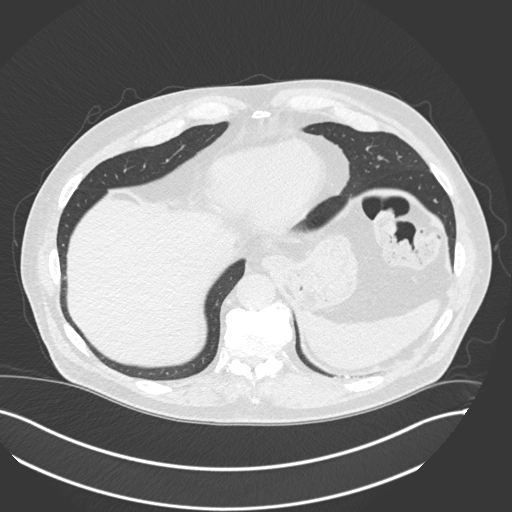
[im 66/171  mediastinal]
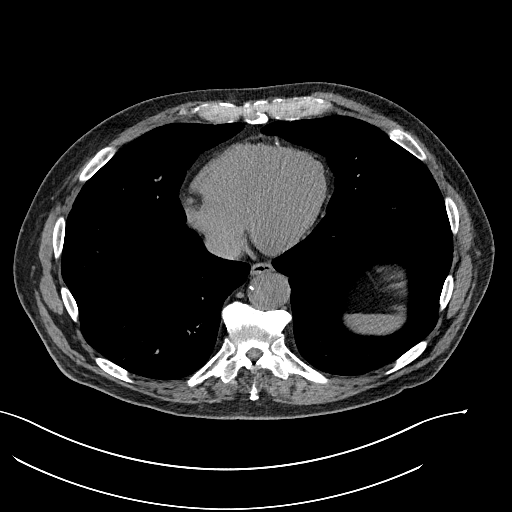
[im 66/171  lung]
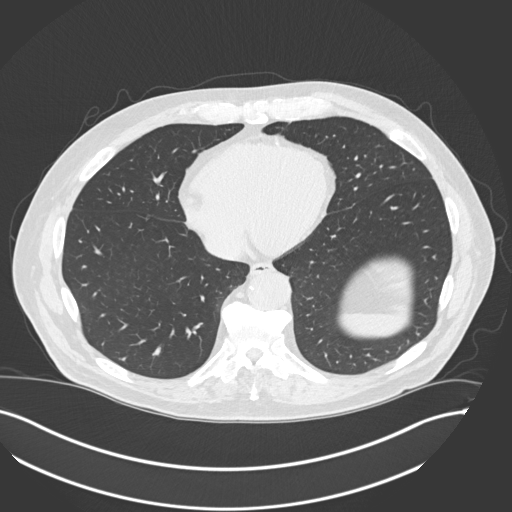
[im 79/171  lung]
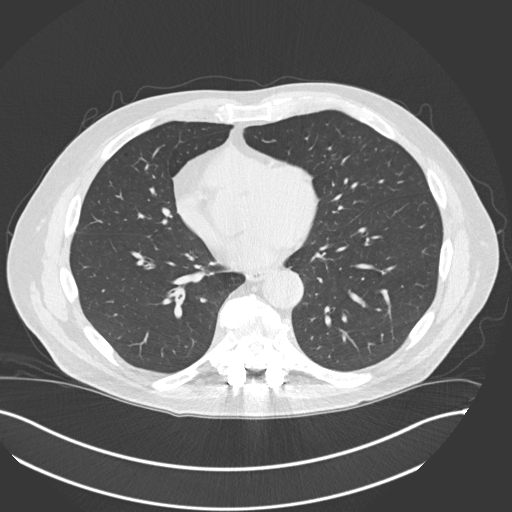
[im 92/171  lung]
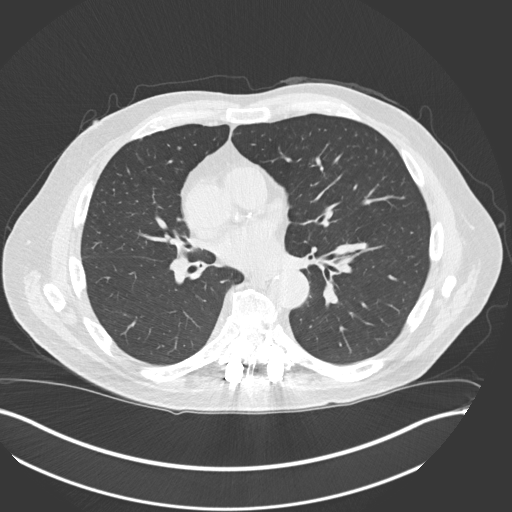
[im 105/171  lung]
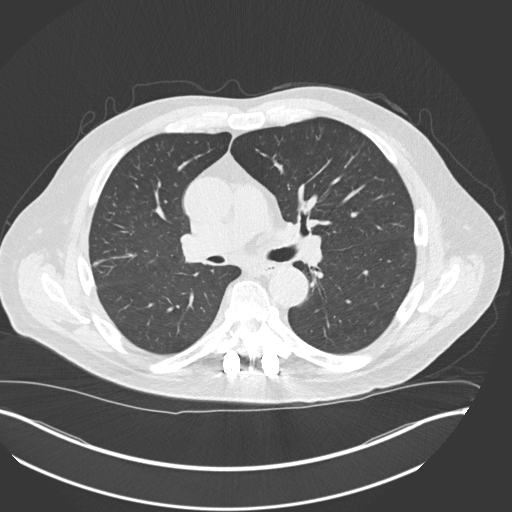
[im 118/171  mediastinal]
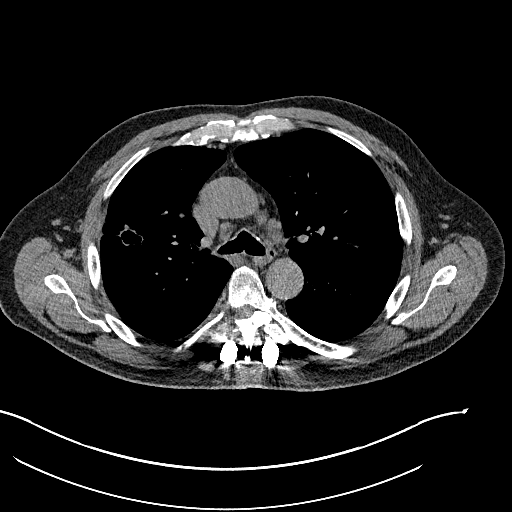
[im 118/171  lung]
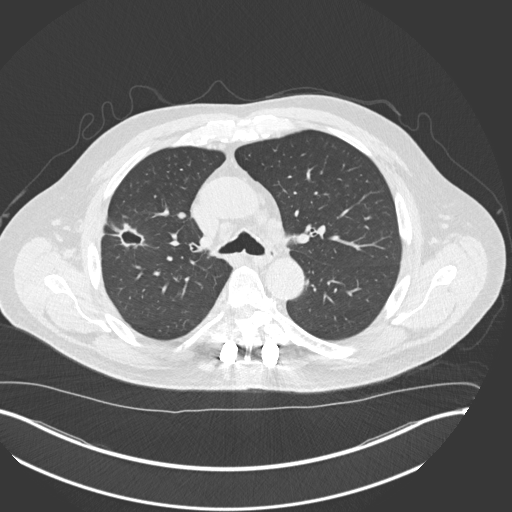
[im 131/171  lung]
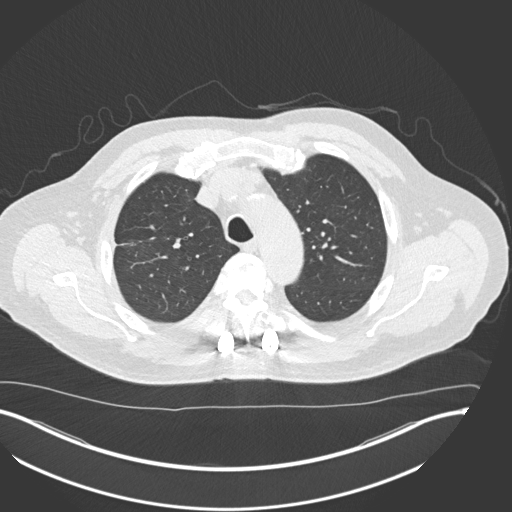
[im 144/171  lung]
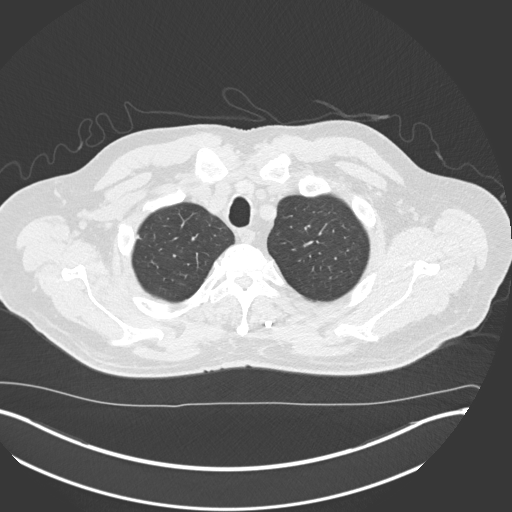
[im 157/171  lung]
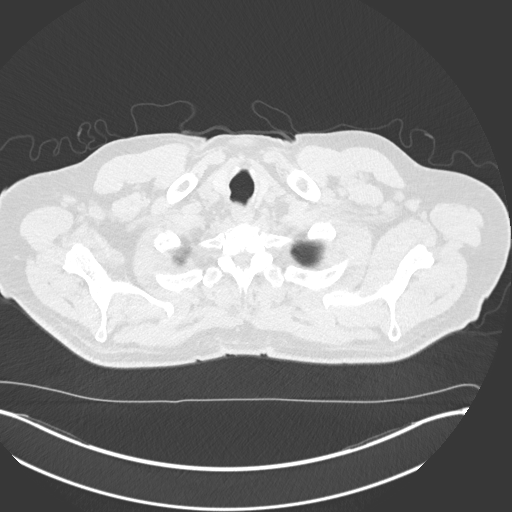

[Series 5: coronal · coronal · 0.72mm/px · 3 of 160 slices shown]
[im 32/160  lung]
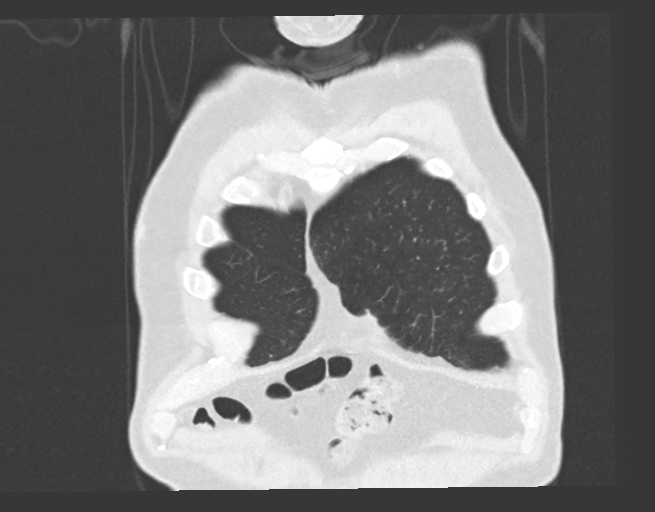
[im 64/160  lung]
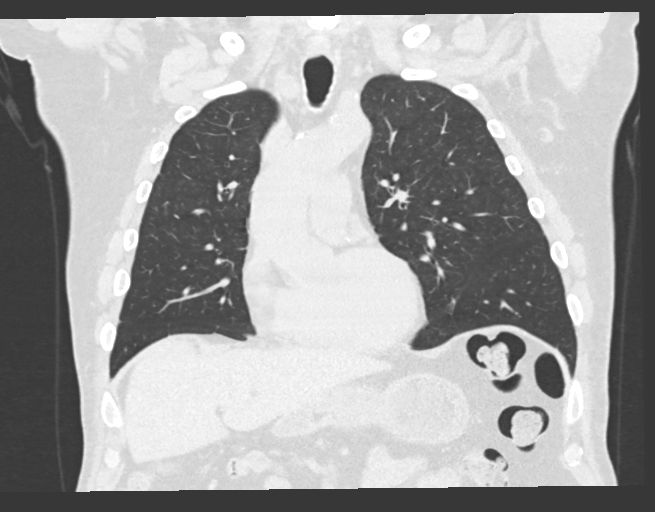
[im 96/160  lung]
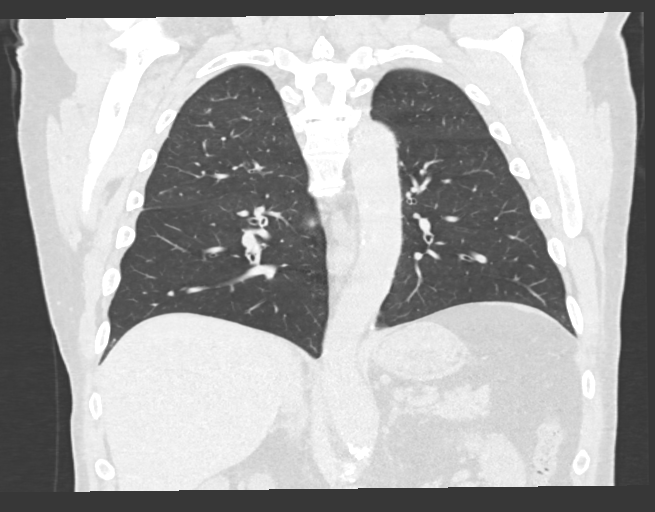

[15 of 36 positions shown; findings below may reference images not displayed]

FINDINGS: Cardiovascular: Atherosclerosis of thoracic aorta is noted without
aneurysm formation. Normal cardiac size. No pericardial effusion.

Mediastinum/Nodes: Esophagus and thyroid gland are unremarkable.
Stable 12 mm precarinal lymph node is noted. 15 mm subcarinal lymph
node is noted which is enlarged compared to prior exam.

Lungs/Pleura: No pneumothorax or pleural effusion is noted. Cavitary
lesion seen in right upper lobe on prior exam is significantly
smaller currently, measuring 2.4 x 1.3 cm currently. This most
likely represents residual of cavitary pneumonia. Left lung is
clear.

Upper Abdomen: No acute abnormality.

Musculoskeletal: Status post surgical posterior fusion of
midthoracic spine. No acute osseous abnormality is noted.
IMPRESSION: Cavitary lesion seen in right upper lobe on prior exam is
significantly smaller currently, measuring 2.4 x 1.3 cm. This most
likely represents residual density related to prior cavitary
pneumonia.

Stable 12 mm precarinal lymph node is noted. However, 15 mm
subcarinal lymph node is noted which is enlarged compared to prior
exam. While this may be reactive or inflammatory in etiology,
neoplasm cannot be excluded, and follow-up chest CT in 3 months is
recommended to ensure stability or resolution.

Aortic Atherosclerosis ([id number]).

## 2023-09-11 DIAGNOSIS — R262 Difficulty in walking, not elsewhere classified: Secondary | ICD-10-CM | POA: Diagnosis not present

## 2023-09-11 DIAGNOSIS — M6281 Muscle weakness (generalized): Secondary | ICD-10-CM | POA: Diagnosis not present

## 2023-09-11 DIAGNOSIS — M25562 Pain in left knee: Secondary | ICD-10-CM | POA: Diagnosis not present

## 2023-09-11 DIAGNOSIS — Z96652 Presence of left artificial knee joint: Secondary | ICD-10-CM | POA: Diagnosis not present

## 2023-09-12 ENCOUNTER — Telehealth: Payer: Self-pay | Admitting: Acute Care

## 2023-09-12 ENCOUNTER — Other Ambulatory Visit: Payer: Self-pay | Admitting: Cardiology

## 2023-09-12 DIAGNOSIS — Z5189 Encounter for other specified aftercare: Secondary | ICD-10-CM | POA: Diagnosis not present

## 2023-09-12 NOTE — Telephone Encounter (Signed)
Returned call to patient who had inquired about LDCT results.  Explained results are not yet released and we have been experiencing delays in readings due to a nationwide shortage of radiologists.  Patient uses mychart and will review periodically for updates.

## 2023-10-03 ENCOUNTER — Other Ambulatory Visit: Payer: Self-pay | Admitting: Cardiology

## 2023-10-12 ENCOUNTER — Other Ambulatory Visit: Payer: Self-pay

## 2023-10-12 DIAGNOSIS — Z87891 Personal history of nicotine dependence: Secondary | ICD-10-CM

## 2023-10-12 DIAGNOSIS — Z122 Encounter for screening for malignant neoplasm of respiratory organs: Secondary | ICD-10-CM

## 2023-10-12 DIAGNOSIS — F17210 Nicotine dependence, cigarettes, uncomplicated: Secondary | ICD-10-CM

## 2023-10-12 NOTE — Telephone Encounter (Signed)
 Spoke with patient and reviewed results. Copy also sent to mychart. Patient has no questions.

## 2023-10-12 NOTE — Telephone Encounter (Signed)
 Patient had LDCT on 08/28/2023 that resulted as a LR2. Per Lauraine Lites NP 13.84mm nodule is stable and will need annual LDCT due 08/28/2024. Per pt's chart the pt is on statin therapy for noted CAD.    IMPRESSION: 1. Lung-RADS 2, benign appearance or behavior. Continue annual screening with low-dose chest CT without contrast in 12 months. 2. Aortic atherosclerosis (ICD10-I70.0). Coronary artery calcification. 3.  Emphysema (ICD10-J43.9).     Electronically Signed   By: Newell Eke M.D.   On: 09/20/2023 10:53

## 2023-10-13 ENCOUNTER — Other Ambulatory Visit: Payer: Self-pay | Admitting: Cardiology

## 2023-10-16 ENCOUNTER — Other Ambulatory Visit: Payer: Self-pay | Admitting: Cardiology

## 2023-10-16 DIAGNOSIS — I714 Abdominal aortic aneurysm, without rupture, unspecified: Secondary | ICD-10-CM | POA: Diagnosis not present

## 2023-10-16 DIAGNOSIS — Z299 Encounter for prophylactic measures, unspecified: Secondary | ICD-10-CM | POA: Diagnosis not present

## 2023-10-16 DIAGNOSIS — I10 Essential (primary) hypertension: Secondary | ICD-10-CM | POA: Diagnosis not present

## 2023-10-16 DIAGNOSIS — F17210 Nicotine dependence, cigarettes, uncomplicated: Secondary | ICD-10-CM | POA: Diagnosis not present

## 2023-10-16 DIAGNOSIS — R21 Rash and other nonspecific skin eruption: Secondary | ICD-10-CM | POA: Diagnosis not present

## 2023-11-01 ENCOUNTER — Ambulatory Visit: Payer: Medicare Other | Admitting: Cardiology

## 2023-11-02 ENCOUNTER — Other Ambulatory Visit: Payer: Self-pay | Admitting: Cardiology

## 2023-11-02 DIAGNOSIS — I2510 Atherosclerotic heart disease of native coronary artery without angina pectoris: Secondary | ICD-10-CM

## 2023-11-06 DIAGNOSIS — M4317 Spondylolisthesis, lumbosacral region: Secondary | ICD-10-CM | POA: Diagnosis not present

## 2023-11-06 DIAGNOSIS — M4725 Other spondylosis with radiculopathy, thoracolumbar region: Secondary | ICD-10-CM | POA: Diagnosis not present

## 2023-11-06 DIAGNOSIS — M4712 Other spondylosis with myelopathy, cervical region: Secondary | ICD-10-CM | POA: Diagnosis not present

## 2023-11-06 DIAGNOSIS — M4726 Other spondylosis with radiculopathy, lumbar region: Secondary | ICD-10-CM | POA: Diagnosis not present

## 2023-11-10 ENCOUNTER — Telehealth: Payer: Self-pay | Admitting: Pharmacist

## 2023-11-10 ENCOUNTER — Other Ambulatory Visit: Payer: Self-pay | Admitting: Pharmacist

## 2023-11-10 DIAGNOSIS — E7800 Pure hypercholesterolemia, unspecified: Secondary | ICD-10-CM

## 2023-11-10 NOTE — Telephone Encounter (Signed)
Card No. [id number]  BIN [id number]  PCN [id number]  PC Group [id number]   Due for follow up lab - will go on Monday Feb 3,2025   Enrolled in [facility] grant - info shared over the the phone.

## 2023-11-13 ENCOUNTER — Encounter: Payer: Self-pay | Admitting: Cardiology

## 2023-11-13 ENCOUNTER — Ambulatory Visit: Payer: Medicare Other | Attending: Cardiology | Admitting: Cardiology

## 2023-11-13 VITALS — BP 140/82 | HR 52 | Resp 16 | Ht 72.0 in | Wt 226.6 lb

## 2023-11-13 DIAGNOSIS — I2510 Atherosclerotic heart disease of native coronary artery without angina pectoris: Secondary | ICD-10-CM | POA: Insufficient documentation

## 2023-11-13 DIAGNOSIS — F17210 Nicotine dependence, cigarettes, uncomplicated: Secondary | ICD-10-CM | POA: Insufficient documentation

## 2023-11-13 DIAGNOSIS — E7800 Pure hypercholesterolemia, unspecified: Secondary | ICD-10-CM | POA: Insufficient documentation

## 2023-11-13 DIAGNOSIS — I10 Essential (primary) hypertension: Secondary | ICD-10-CM | POA: Insufficient documentation

## 2023-11-13 MED ORDER — ATORVASTATIN CALCIUM 10 MG PO TABS: 10.0000 mg | ORAL_TABLET | Freq: Every day | ORAL | 3 refills | Status: DC

## 2023-11-13 MED ORDER — ATORVASTATIN CALCIUM 10 MG PO TABS: 10.0000 mg | ORAL_TABLET | ORAL | 1 refills | Status: DC

## 2023-11-13 MED ORDER — LISINOPRIL-HYDROCHLOROTHIAZIDE 20-12.5 MG PO TABS: 1.0000 | ORAL_TABLET | Freq: Every day | ORAL | 3 refills | Status: AC

## 2023-11-13 NOTE — Progress Notes (Addendum)
Cardiology Office Note:  .   Date:  11/13/2023  ID:  Leon Mitchell, DOB 12/02/1955, MRN [id number] PCP: Ignatius Specking, MD  [facility] Providers Cardiologist:  Yates Decamp, MD   History of Present Illness: Leon Mitchell   Leon Mitchell is a 68 y.o. 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   Discussed the use of AI scribe software for clinical note transcription with the patient, who gave verbal consent to proceed.  History of Present Illness   The patient, with coronary artery disease, presents for a cardiology follow-up visit.  He experiences occasional chest discomfort described as a 'twinge' but denies any significant chest pain or tightness similar to his previous heart attack. He is currently on Repatha for cholesterol management and mentions a previous LDL level of 70 that increased to 90, possibly due to a missed dose. He is also taking atorvastatin, previously at 20 mg every other day, but is considering adjusting the dosage.  He reports a discrepancy in leg length post-surgery, with his right leg feeling shorter, causing him to limp. He has undergone hip and knee surgeries since his last visit. He is scheduled for an MRI next week for his back issues. No current plans for back surgery have been communicated to him.  He has a history of smoking but has quit for about two weeks. He acknowledges the health benefits of quitting smoking and is working on maintaining this change.  He mentions his heart rate being between 46 to 50 bpm and does not express concern about it.  He recently got married and is living part-time in [place], [place]. He has been trying to regain strength by  going to the gym and using a bike to increase his heart rate.      Labs   Lab Results  Component Value Date   CHOL 145 08/02/2023   HDL 34 (L) 08/02/2023   LDLCALC 89 08/02/2023   TRIG 122 08/02/2023   CHOLHDL 4.3 08/02/2023   Lab Results  Component Value Date   NA 136 08/08/2023   K 4.7 08/08/2023   CO2 26 08/08/2023   GLUCOSE 119 (H) 08/08/2023   BUN 31 (H) 08/08/2023   CREATININE 0.92 08/08/2023   CALCIUM 8.4 (L) 08/08/2023   EGFR 93 12/23/2022   GFRNONAA >60 08/08/2023      Latest Ref Rng & Units 08/08/2023    3:42 AM 08/01/2023   11:48 AM 04/04/2023    3:23 AM  BMP  Glucose 70 - 99 mg/dL 147  94  829   BUN 8 - 23 mg/dL 31  26  28    Creatinine 0.61 - 1.24 mg/dL 5.62  1.30  8.65   Sodium 135 - 145 mmol/L 136  139  137   Potassium 3.5 - 5.1 mmol/L 4.7  4.4  4.2   Chloride 98 - 111 mmol/L 105  106  103   CO2 22 - 32 mmol/L 26  25  26    Calcium 8.9 - 10.3 mg/dL 8.4  9.0  8.6       Latest Ref Rng & Units 08/08/2023    3:42 AM 08/01/2023   11:48 AM 04/04/2023    3:23 AM  CBC  WBC 4.0 - 10.5 K/uL 12.1  6.6  10.3   Hemoglobin 13.0 - 17.0 g/dL 86.5  78.4  69.6   Hematocrit 39.0 - 52.0 % 38.8  47.2  42.8   Platelets 150 - 400 K/uL 231  250  204    Review of Systems  Cardiovascular:  Negative for chest pain, dyspnea on exertion and leg swelling.  Musculoskeletal:  Positive for arthritis and back pain.   Physical Exam:   VS:  BP (!) 140/82 (BP Location: Left Arm, Patient Position: Sitting, Cuff Size: Large)   Pulse (!) 52   Resp 16   Ht 6' (1.829 m)   Wt 226 lb 9.6 oz (102.8 kg)   SpO2 98%   BMI 30.73 kg/m     Wt Readings from Last 3 Encounters:  11/13/23 226 lb 9.6 oz (102.8 kg)  08/07/23 225 lb (102.1 kg)  08/01/23 225 lb (102.1 kg)   Physical Exam Neck:     Vascular: No carotid bruit or JVD.  Cardiovascular:     Rate and Rhythm: Normal rate and regular rhythm.     Pulses: Intact distal pulses.     Heart sounds: Normal heart sounds. No murmur  heard.    No gallop.  Pulmonary:     Effort: Pulmonary effort is normal.     Breath sounds: Normal breath sounds.  Abdominal:     General: Bowel sounds are normal.     Palpations: Abdomen is soft.  Musculoskeletal:     Right lower leg: No edema.     Left lower leg: No edema.    Studies Reviewed: Leon Mitchell    LHC 10/18/2022: EF 45, inferior HK; LAD mid mild disease; LCx mid 30; RCA proximal 95-99. Inferolateral STEMI in 10/2022 s/p 4 x 16 mm DES and 4.5x12 to the proximal RCA    TTE 10/19/2022: EF 60-65, no RWMA, normal RVSF, mild AI, RAP 3  Carotid artery duplex 02/22/2023: Right Carotid: Velocities in the right ICA are consistent with a 1-39% stenosis.  Left Carotid: Velocities in the left ICA are consistent with a 1-39% stenosis.   Vertebrals: Bilateral vertebral arteries demonstrate antegrade flow.  EKG:    EKG Interpretation Date/Time:  Monday November 13 2023 10:40:23 EST Ventricular Rate:  52 PR Interval:  168 QRS Duration:  108 QT Interval:  438 QTC Calculation: 407 R Axis:   41  Text Interpretation: EKG 11/13/2023: Normal sinus rhythm at rate of 52 bpm, normal EKG.  No change from 10/19/2022. Confirmed by Delrae Rend [phone]) on 11/13/2023 11:01:32 AM    Medications and allergies    Allergies  Allergen Reactions   Lipitor [Atorvastatin] Other (See Comments)    Severe myalgia   Rosuvastatin Other (See Comments)    Severe myalgia    Current Outpatient Medications:    ASPIRIN LOW DOSE 81 MG tablet, Take 81 mg by mouth daily., Disp: , Rfl:    atorvastatin (LIPITOR) 10 MG tablet, Take 1 tablet (10 mg total) by mouth daily., Disp: 90 tablet, Rfl: 3   diclofenac (VOLTAREN) 75 MG EC tablet, Take 75 mg by mouth., Disp: , Rfl:    Evolocumab (REPATHA SURECLICK) 140 MG/ML SOAJ, Inject 140 mg into the skin every 14 (fourteen) days., Disp: 6 mL, Rfl: 3   gabapentin (NEURONTIN) 300 MG capsule, Take 600 mg by mouth in the morning, at noon, and at bedtime., Disp: , Rfl:     lisinopril-hydrochlorothiazide (ZESTORETIC) 20-12.5 MG tablet, Take 1 tablet by mouth daily., Disp: 90 tablet, Rfl: 3   metoprolol tartrate (LOPRESSOR)  25 MG tablet, Take 0.5 tablets by mouth daily., Disp: , Rfl:    nitroGLYCERIN (NITROSTAT) 0.4 MG SL tablet, Place 1 tablet (0.4 mg total) under the tongue every 5 (five) minutes as needed for up to 25 days for chest pain., Disp: 25 tablet, Rfl: 3   oxyCODONE (OXY IR/ROXICODONE) 5 MG immediate release tablet, Take 1-2 tablets (5-10 mg total) by mouth every 6 (six) hours as needed for severe pain (pain score 7-10)., Disp: 42 tablet, Rfl: 0   Polyethyl Glycol-Propyl Glycol (SYSTANE) 0.4-0.3 % SOLN, Place 1-2 drops into both eyes 3 (three) times daily as needed (dry/irritated eyes.)., Disp: , Rfl:    predniSONE (DELTASONE) 10 MG tablet, Take 10 mg by mouth 2 (two) times daily., Disp: , Rfl:    tamsulosin (FLOMAX) 0.4 MG CAPS capsule, Take 0.4 mg by mouth in the morning., Disp: , Rfl:    traMADol (ULTRAM) 50 MG tablet, TAKE 1 TABLET BY MOUTH TWICE DAILY AS NEEDED, Disp: 90 tablet, Rfl: 2   ASSESSMENT AND PLAN: .      ICD-10-CM   1. Coronary artery disease involving native coronary artery of native heart without angina pectoris  I25.10 EKG 12-Lead    DISCONTINUED: atorvastatin (LIPITOR) 10 MG tablet    2. Primary hypertension  I10 lisinopril-hydrochlorothiazide (ZESTORETIC) 20-12.5 MG tablet    3. Cigarette smoker  F17.210     4. Pure hypercholesterolemia  E78.00 DISCONTINUED: atorvastatin (LIPITOR) 10 MG tablet      Assessment and Plan    Coronary Artery Disease (CAD) Previous myocardial infarction, currently asymptomatic with occasional mild twinges. EKG and physical exam normal. On Repatha for cholesterol management and aspirin for antiplatelet therapy. LDL levels fluctuating, likely due to missed doses of Repatha. Emphasized consistent medication adherence. - Continue aspirin 81 mg daily - Discontinue Effient (prasugrel) - Continue  Repatha - Restart atorvastatin 10 mg daily, increase to 20 mg daily if tolerated - Follow-up in one year  Hypertension Suboptimal blood pressure management on lisinopril 20 mg daily. Discussed adding HCTZ to improve control with minimal side effects. - Discontinue lisinopril 20 mg - Start lisinopril-HCTZ 20/12.5 mg daily - Monitor blood pressure  Smoking Cessation Current smoker with past quit attempts. Discussed significant health benefits of quitting, including reduced cardiovascular risk and cost savings. - Encourage smoking cessation - Provide resources and support for quitting  Chronic Back Pain Ongoing back pain with MRI scheduled. No current recommendation for surgery. Future interventions based on MRI findings discussed. 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 Advised heel lift and further orthotic evaluation to prevent complications. Designer, fashion/clothing (Dr. Lequita Halt) for orthotics referral - Evaluate for orthotics to address leg length discrepancy  General Health Maintenance Advised on lifestyle modifications and regular follow-ups. Discussed importance of exercise and cholesterol monitoring. Recommended establishing care with a cardiologist if relocating to [place], [place]. - Encourage regular exercise - Monitor cholesterol levels - Establish care with a cardiologist in [place], [place] if relocating  Follow-up - Follow-up in one year - Establish care with a cardiologist in [place], [place] if relocating.   Total time spent on patient care was 38 minutes including evaluation of his medications, evaluation of medical records, coordination of care, discussions regarding primary/secondary prevention.  Signed,  Yates Decamp, MD, [facility] 11/13/2023, 11:43 AM [facility] [address] [place],  [place] [address] Phone: [phone]. Fax:  [phone]

## 2023-11-13 NOTE — Addendum Note (Signed)
Addended by: Dossie Arbour on: 11/13/2023 11:27 AM   Modules accepted: Orders

## 2023-11-13 NOTE — Patient Instructions (Addendum)
Medication Instructions:  Your physician has recommended you make the following change in your medication:  Stop Effient Start Atorvastatin 10 mg by mouth daily Stop Lisinopril Start Lisinopril hydrochlorothiazide 20/12.5 mg by mouth daily    *If you need a refill on your cardiac medications before your next appointment, please call your pharmacy*   Lab Work: none If you have labs (blood work) drawn today and your tests are completely normal, you will receive your results only by: MyChart Message (if you have MyChart) OR A paper copy in the mail If you have any lab test that is abnormal or we need to change your treatment, we will call you to review the results.   Testing/Procedures: none   Follow-Up: At [facility], you and your health needs are our priority.  As part of our continuing mission to provide you with exceptional heart care, we have created designated Provider Care Teams.  These Care Teams include your primary Cardiologist (physician) and Advanced Practice Providers (APPs -  Physician Assistants and Nurse Practitioners) who all work together to provide you with the care you need, when you need it.  We recommend signing up for the patient portal called "MyChart".  Sign up information is provided on this After Visit Summary.  MyChart is used to connect with patients for Virtual Visits (Telemedicine).  Patients are able to view lab/test results, encounter notes, upcoming appointments, etc.  Non-urgent messages can be sent to your provider as well.   To learn more about what you can do with MyChart, go to [email or web address].    Your next appointment:   12 month(s)  Provider:   Yates Decamp, MD     Other Instructions

## 2023-11-14 ENCOUNTER — Encounter: Payer: Self-pay | Admitting: Cardiology

## 2023-11-14 LAB — LIPID PANEL
Chol/HDL Ratio: 3.5 {ratio} (ref 0.0–5.0)
Cholesterol, Total: 123 mg/dL (ref 100–199)
HDL: 35 mg/dL — ABNORMAL LOW (ref >39)
LDL Chol Calc (NIH): 65 mg/dL (ref 0–99)
Triglycerides: 127 mg/dL (ref 0–149)
VLDL Cholesterol Cal: 23 mg/dL (ref 5–40)

## 2023-11-14 NOTE — Progress Notes (Signed)
NOrmal lipids

## 2023-11-16 DIAGNOSIS — M5126 Other intervertebral disc displacement, lumbar region: Secondary | ICD-10-CM | POA: Diagnosis not present

## 2023-11-16 DIAGNOSIS — M4184 Other forms of scoliosis, thoracic region: Secondary | ICD-10-CM | POA: Diagnosis not present

## 2023-11-16 DIAGNOSIS — M48061 Spinal stenosis, lumbar region without neurogenic claudication: Secondary | ICD-10-CM | POA: Diagnosis not present

## 2023-11-16 DIAGNOSIS — M47817 Spondylosis without myelopathy or radiculopathy, lumbosacral region: Secondary | ICD-10-CM | POA: Diagnosis not present

## 2023-11-16 DIAGNOSIS — I714 Abdominal aortic aneurysm, without rupture, unspecified: Secondary | ICD-10-CM | POA: Diagnosis not present

## 2023-11-16 DIAGNOSIS — Z981 Arthrodesis status: Secondary | ICD-10-CM | POA: Diagnosis not present

## 2023-11-16 DIAGNOSIS — M47814 Spondylosis without myelopathy or radiculopathy, thoracic region: Secondary | ICD-10-CM | POA: Diagnosis not present

## 2023-11-16 DIAGNOSIS — M4714 Other spondylosis with myelopathy, thoracic region: Secondary | ICD-10-CM | POA: Diagnosis not present

## 2023-11-16 DIAGNOSIS — M5124 Other intervertebral disc displacement, thoracic region: Secondary | ICD-10-CM | POA: Diagnosis not present

## 2023-11-21 DIAGNOSIS — M48061 Spinal stenosis, lumbar region without neurogenic claudication: Secondary | ICD-10-CM | POA: Diagnosis not present

## 2023-11-21 DIAGNOSIS — M4714 Other spondylosis with myelopathy, thoracic region: Secondary | ICD-10-CM | POA: Diagnosis not present

## 2023-11-21 DIAGNOSIS — M47816 Spondylosis without myelopathy or radiculopathy, lumbar region: Secondary | ICD-10-CM | POA: Diagnosis not present

## 2023-11-21 DIAGNOSIS — M7138 Other bursal cyst, other site: Secondary | ICD-10-CM | POA: Diagnosis not present

## 2023-11-21 DIAGNOSIS — M4126 Other idiopathic scoliosis, lumbar region: Secondary | ICD-10-CM | POA: Diagnosis not present

## 2023-12-04 DIAGNOSIS — D044 Carcinoma in situ of skin of scalp and neck: Secondary | ICD-10-CM | POA: Diagnosis not present

## 2023-12-04 DIAGNOSIS — D485 Neoplasm of uncertain behavior of skin: Secondary | ICD-10-CM | POA: Diagnosis not present

## 2023-12-04 DIAGNOSIS — L570 Actinic keratosis: Secondary | ICD-10-CM | POA: Diagnosis not present

## 2023-12-11 DIAGNOSIS — D044 Carcinoma in situ of skin of scalp and neck: Secondary | ICD-10-CM | POA: Diagnosis not present

## 2023-12-18 ENCOUNTER — Telehealth: Payer: Self-pay | Admitting: *Deleted

## 2023-12-18 DIAGNOSIS — M47817 Spondylosis without myelopathy or radiculopathy, lumbosacral region: Secondary | ICD-10-CM | POA: Diagnosis not present

## 2023-12-18 DIAGNOSIS — M5117 Intervertebral disc disorders with radiculopathy, lumbosacral region: Secondary | ICD-10-CM | POA: Diagnosis not present

## 2023-12-18 NOTE — Telephone Encounter (Signed)
   Pre-operative Risk Assessment    Patient Name: Leon Mitchell  DOB: [birth year] MRN: [id number]   Date of last office visit: 11/13/23 DR. Jacinto Halim Date of next office visit: NONE   Request for Surgical Clearance    Procedure:   B/L LUMBAR MEDIAL BRANCH BLOCK x 2 AND RADIO FREQUENCY ABLATION x 2  Date of Surgery:  Clearance TBD                                Surgeon:  Juliane Lack Surgeon's Group or Practice Name:  [facility] Phone number:  [phone] LINDA, RN Fax number:  [phone]   Type of Clearance Requested:   - Medical  - Pharmacy:  Hold Prasugrel (Effient) (STATES: OFF BLOOD THINNERS FOR ONE WEEK PRIOR TO SURGERY, THOUGH THE REQUEST HAS A HAND WRITTEN x 2) NOT SURE IF THEY ARE ASKING BLOOD THINNER TO BE HELD X 2 WEEKS OR 2 SEPARATE PROCEDURE?    Type of Anesthesia:  Not Indicated   Additional requests/questions:    Leon Mitchell   12/18/2023, 6:07 PM

## 2023-12-20 ENCOUNTER — Encounter: Payer: Self-pay | Admitting: Cardiology

## 2023-12-20 NOTE — Telephone Encounter (Signed)
 Pt was recently seen 11/13/23 by Dr. Jacinto Halim. Epidural spine injections are fairly low risk but since procedure listed includes RF ablation, will ask Dr. Jacinto Halim to review. Also, request lists holding Prasugrel. However, the patient's Prasugrel was DC'd at his last OV. He is only on ASA now.  Dr. Jacinto Halim - Can you provide surgical clearance for upcoming bilat lumbar medial branch block x 2 and RF ablation x 2 based upon your last OV? Please route back to P CV DIV PREOP. Tereso Newcomer, PA-C    12/20/2023 7:45 AM

## 2023-12-27 NOTE — Telephone Encounter (Signed)
 I have already done this and can be seen in the letter section

## 2023-12-27 NOTE — Telephone Encounter (Signed)
 Hello Dr. Jacinto Halim,  Please see message from APP.  Patient is requesting bilateral lumbar medial branch block x 2 and radio frequency ablation x 2.  Please provide recommendations on cardiac risk for upcoming procedure.  Thank you for your help.  Please direct your response to CV DIV preop pool.  Thomasene Ripple. Arion Shankles NP-C     12/27/2023, 11:09 AM [facility] [address] Office [phone] Fax [phone]

## 2023-12-28 DIAGNOSIS — Z299 Encounter for prophylactic measures, unspecified: Secondary | ICD-10-CM | POA: Diagnosis not present

## 2023-12-28 DIAGNOSIS — N529 Male erectile dysfunction, unspecified: Secondary | ICD-10-CM | POA: Diagnosis not present

## 2023-12-28 DIAGNOSIS — E7800 Pure hypercholesterolemia, unspecified: Secondary | ICD-10-CM | POA: Diagnosis not present

## 2023-12-28 DIAGNOSIS — R5383 Other fatigue: Secondary | ICD-10-CM | POA: Diagnosis not present

## 2024-01-01 DIAGNOSIS — D044 Carcinoma in situ of skin of scalp and neck: Secondary | ICD-10-CM | POA: Diagnosis not present

## 2024-01-16 DIAGNOSIS — M47816 Spondylosis without myelopathy or radiculopathy, lumbar region: Secondary | ICD-10-CM | POA: Diagnosis not present

## 2024-02-05 DIAGNOSIS — I10 Essential (primary) hypertension: Secondary | ICD-10-CM | POA: Diagnosis not present

## 2024-02-05 DIAGNOSIS — M47817 Spondylosis without myelopathy or radiculopathy, lumbosacral region: Secondary | ICD-10-CM | POA: Diagnosis not present

## 2024-02-05 DIAGNOSIS — G629 Polyneuropathy, unspecified: Secondary | ICD-10-CM | POA: Diagnosis not present

## 2024-02-05 DIAGNOSIS — E7800 Pure hypercholesterolemia, unspecified: Secondary | ICD-10-CM | POA: Diagnosis not present

## 2024-02-05 DIAGNOSIS — I2510 Atherosclerotic heart disease of native coronary artery without angina pectoris: Secondary | ICD-10-CM | POA: Diagnosis not present

## 2024-02-05 DIAGNOSIS — J449 Chronic obstructive pulmonary disease, unspecified: Secondary | ICD-10-CM | POA: Diagnosis not present

## 2024-02-05 DIAGNOSIS — M47816 Spondylosis without myelopathy or radiculopathy, lumbar region: Secondary | ICD-10-CM | POA: Diagnosis not present

## 2024-03-30 DIAGNOSIS — S00211A Abrasion of right eyelid and periocular area, initial encounter: Secondary | ICD-10-CM | POA: Diagnosis not present

## 2024-03-30 DIAGNOSIS — S0501XA Injury of conjunctiva and corneal abrasion without foreign body, right eye, initial encounter: Secondary | ICD-10-CM | POA: Diagnosis not present

## 2024-03-30 DIAGNOSIS — W458XXA Other foreign body or object entering through skin, initial encounter: Secondary | ICD-10-CM | POA: Diagnosis not present

## 2024-03-30 DIAGNOSIS — S0591XA Unspecified injury of right eye and orbit, initial encounter: Secondary | ICD-10-CM | POA: Diagnosis not present

## 2024-03-30 DIAGNOSIS — R030 Elevated blood-pressure reading, without diagnosis of hypertension: Secondary | ICD-10-CM | POA: Diagnosis not present

## 2024-03-30 DIAGNOSIS — H5711 Ocular pain, right eye: Secondary | ICD-10-CM | POA: Diagnosis not present

## 2024-04-04 DIAGNOSIS — M4317 Spondylolisthesis, lumbosacral region: Secondary | ICD-10-CM | POA: Diagnosis not present

## 2024-04-04 DIAGNOSIS — M5417 Radiculopathy, lumbosacral region: Secondary | ICD-10-CM | POA: Diagnosis not present

## 2024-04-04 DIAGNOSIS — M48061 Spinal stenosis, lumbar region without neurogenic claudication: Secondary | ICD-10-CM | POA: Diagnosis not present

## 2024-04-08 DIAGNOSIS — Z7982 Long term (current) use of aspirin: Secondary | ICD-10-CM | POA: Diagnosis not present

## 2024-04-08 DIAGNOSIS — M5417 Radiculopathy, lumbosacral region: Secondary | ICD-10-CM | POA: Diagnosis not present

## 2024-04-08 DIAGNOSIS — I2510 Atherosclerotic heart disease of native coronary artery without angina pectoris: Secondary | ICD-10-CM | POA: Diagnosis not present

## 2024-04-08 DIAGNOSIS — E7800 Pure hypercholesterolemia, unspecified: Secondary | ICD-10-CM | POA: Diagnosis not present

## 2024-04-08 DIAGNOSIS — Z79899 Other long term (current) drug therapy: Secondary | ICD-10-CM | POA: Diagnosis not present

## 2024-04-08 DIAGNOSIS — Z955 Presence of coronary angioplasty implant and graft: Secondary | ICD-10-CM | POA: Diagnosis not present

## 2024-04-08 DIAGNOSIS — J449 Chronic obstructive pulmonary disease, unspecified: Secondary | ICD-10-CM | POA: Diagnosis not present

## 2024-04-08 DIAGNOSIS — M5416 Radiculopathy, lumbar region: Secondary | ICD-10-CM | POA: Diagnosis not present

## 2024-04-08 DIAGNOSIS — I252 Old myocardial infarction: Secondary | ICD-10-CM | POA: Diagnosis not present

## 2024-04-08 DIAGNOSIS — G629 Polyneuropathy, unspecified: Secondary | ICD-10-CM | POA: Diagnosis not present

## 2024-04-08 DIAGNOSIS — I10 Essential (primary) hypertension: Secondary | ICD-10-CM | POA: Diagnosis not present

## 2024-04-19 ENCOUNTER — Telehealth: Payer: Self-pay | Admitting: Cardiology

## 2024-04-19 DIAGNOSIS — R7989 Other specified abnormal findings of blood chemistry: Secondary | ICD-10-CM | POA: Diagnosis not present

## 2024-04-19 DIAGNOSIS — R531 Weakness: Secondary | ICD-10-CM | POA: Diagnosis not present

## 2024-04-19 DIAGNOSIS — I951 Orthostatic hypotension: Secondary | ICD-10-CM | POA: Diagnosis not present

## 2024-04-19 NOTE — Telephone Encounter (Signed)
 Spoke to patient's wife and she admits that patient denies symptoms other than very fatigued. Pt has not taking his lisinopril -hydrochlorothiazide  today. Advised to call 911 with blood pressure being that low. Wife agrees with plan of care.

## 2024-04-19 NOTE — Telephone Encounter (Signed)
 Pt c/o BP issue: STAT if pt c/o blurred vision, one-sided weakness or slurred speech.  STAT if BP is GREATER than 180/120 TODAY.  STAT if BP is LESS than 90/60 and SYMPTOMATIC TODAY  1. What is your BP concern? Bp is low   2. Have you taken any BP medication today? No, states he didn't take any because it been low   3. What are your last 5 BP readings? 75/54 - currently  81/55 - 7/10 84/58 - 7/09 86/58 - 7/08    4. Are you having any other symptoms (ex. Dizziness, headache, blurred vision, passed out)? Fatigue (feels weak)

## 2024-05-23 DIAGNOSIS — Z96641 Presence of right artificial hip joint: Secondary | ICD-10-CM | POA: Diagnosis not present

## 2024-05-23 DIAGNOSIS — F17210 Nicotine dependence, cigarettes, uncomplicated: Secondary | ICD-10-CM | POA: Diagnosis not present

## 2024-05-23 DIAGNOSIS — M2170 Unequal limb length (acquired), unspecified site: Secondary | ICD-10-CM | POA: Diagnosis not present

## 2024-05-23 DIAGNOSIS — M4710 Other spondylosis with myelopathy, site unspecified: Secondary | ICD-10-CM | POA: Diagnosis not present

## 2024-05-23 DIAGNOSIS — Z96652 Presence of left artificial knee joint: Secondary | ICD-10-CM | POA: Diagnosis not present

## 2024-05-23 DIAGNOSIS — I10 Essential (primary) hypertension: Secondary | ICD-10-CM | POA: Diagnosis not present

## 2024-05-23 DIAGNOSIS — Z299 Encounter for prophylactic measures, unspecified: Secondary | ICD-10-CM | POA: Diagnosis not present

## 2024-05-23 DIAGNOSIS — J439 Emphysema, unspecified: Secondary | ICD-10-CM | POA: Diagnosis not present

## 2024-05-23 DIAGNOSIS — I2510 Atherosclerotic heart disease of native coronary artery without angina pectoris: Secondary | ICD-10-CM | POA: Diagnosis not present

## 2024-06-13 DIAGNOSIS — R5383 Other fatigue: Secondary | ICD-10-CM | POA: Diagnosis not present

## 2024-06-13 DIAGNOSIS — I10 Essential (primary) hypertension: Secondary | ICD-10-CM | POA: Diagnosis not present

## 2024-06-13 DIAGNOSIS — Z79899 Other long term (current) drug therapy: Secondary | ICD-10-CM | POA: Diagnosis not present

## 2024-06-13 DIAGNOSIS — Z0000 Encounter for general adult medical examination without abnormal findings: Secondary | ICD-10-CM | POA: Diagnosis not present

## 2024-06-13 DIAGNOSIS — Z7189 Other specified counseling: Secondary | ICD-10-CM | POA: Diagnosis not present

## 2024-06-13 DIAGNOSIS — Z299 Encounter for prophylactic measures, unspecified: Secondary | ICD-10-CM | POA: Diagnosis not present

## 2024-06-13 DIAGNOSIS — I25119 Atherosclerotic heart disease of native coronary artery with unspecified angina pectoris: Secondary | ICD-10-CM | POA: Diagnosis not present

## 2024-06-13 DIAGNOSIS — Z6831 Body mass index (BMI) 31.0-31.9, adult: Secondary | ICD-10-CM | POA: Diagnosis not present

## 2024-06-13 DIAGNOSIS — F17210 Nicotine dependence, cigarettes, uncomplicated: Secondary | ICD-10-CM | POA: Diagnosis not present

## 2024-06-13 DIAGNOSIS — Z125 Encounter for screening for malignant neoplasm of prostate: Secondary | ICD-10-CM | POA: Diagnosis not present

## 2024-06-13 DIAGNOSIS — E7800 Pure hypercholesterolemia, unspecified: Secondary | ICD-10-CM | POA: Diagnosis not present

## 2024-06-13 DIAGNOSIS — Z1331 Encounter for screening for depression: Secondary | ICD-10-CM | POA: Diagnosis not present

## 2024-06-13 DIAGNOSIS — Z1339 Encounter for screening examination for other mental health and behavioral disorders: Secondary | ICD-10-CM | POA: Diagnosis not present

## 2024-09-04 ENCOUNTER — Ambulatory Visit (HOSPITAL_COMMUNITY)
Admission: RE | Admit: 2024-09-04 | Discharge: 2024-09-04 | Disposition: A | Discharge status: Home / Self Care | Source: Ambulatory Visit | Attending: Acute Care | Admitting: Acute Care

## 2024-09-04 DIAGNOSIS — Z87891 Personal history of nicotine dependence: Secondary | ICD-10-CM | POA: Diagnosis present

## 2024-09-04 DIAGNOSIS — F17210 Nicotine dependence, cigarettes, uncomplicated: Secondary | ICD-10-CM | POA: Insufficient documentation

## 2024-09-04 DIAGNOSIS — Z122 Encounter for screening for malignant neoplasm of respiratory organs: Secondary | ICD-10-CM | POA: Diagnosis present

## 2024-09-13 ENCOUNTER — Other Ambulatory Visit: Payer: Self-pay

## 2024-09-13 DIAGNOSIS — F17210 Nicotine dependence, cigarettes, uncomplicated: Secondary | ICD-10-CM

## 2024-09-13 DIAGNOSIS — Z122 Encounter for screening for malignant neoplasm of respiratory organs: Secondary | ICD-10-CM

## 2024-09-13 DIAGNOSIS — Z87891 Personal history of nicotine dependence: Secondary | ICD-10-CM

## 2024-11-05 ENCOUNTER — Other Ambulatory Visit: Payer: Self-pay | Admitting: Cardiology
